# Patient Record
Sex: Female | Born: 1965
Health system: Southern US, Community
[De-identification: ages and names within clinical notes are randomized; demographics above are authoritative.]

## PROBLEM LIST (undated history)

## (undated) DIAGNOSIS — D509 Iron deficiency anemia, unspecified: Secondary | ICD-10-CM

## (undated) DIAGNOSIS — R519 Headache, unspecified: Secondary | ICD-10-CM

## (undated) DIAGNOSIS — Q8500 Neurofibromatosis, unspecified: Secondary | ICD-10-CM

## (undated) DIAGNOSIS — R51 Headache: Secondary | ICD-10-CM

## (undated) DIAGNOSIS — K219 Gastro-esophageal reflux disease without esophagitis: Secondary | ICD-10-CM

## (undated) DIAGNOSIS — M1990 Unspecified osteoarthritis, unspecified site: Secondary | ICD-10-CM

## (undated) HISTORY — PX: HERNIA REPAIR: SHX51

## (undated) HISTORY — DX: Iron deficiency anemia, unspecified: D50.9

## (undated) HISTORY — PX: OTHER SURGICAL HISTORY: SHX169

## (undated) HISTORY — PX: TUBAL LIGATION: SHX77

## (undated) HISTORY — PX: TUMOR REMOVAL: SHX12

## (undated) HISTORY — DX: Unspecified osteoarthritis, unspecified site: M19.90

## (undated) HISTORY — DX: Gastro-esophageal reflux disease without esophagitis: K21.9

---

## 2004-07-10 ENCOUNTER — Ambulatory Visit: Payer: Self-pay | Admitting: Physician Assistant

## 2004-08-14 ENCOUNTER — Ambulatory Visit: Payer: Self-pay | Admitting: Physician Assistant

## 2004-09-11 ENCOUNTER — Ambulatory Visit: Payer: Self-pay | Admitting: Physician Assistant

## 2004-10-01 ENCOUNTER — Ambulatory Visit: Payer: Self-pay | Admitting: Physician Assistant

## 2004-11-11 ENCOUNTER — Ambulatory Visit: Payer: Self-pay | Admitting: Physician Assistant

## 2004-12-05 ENCOUNTER — Ambulatory Visit: Payer: Self-pay | Admitting: Physician Assistant

## 2005-01-08 ENCOUNTER — Ambulatory Visit: Payer: Self-pay | Admitting: Physician Assistant

## 2005-02-06 ENCOUNTER — Ambulatory Visit: Payer: Self-pay | Admitting: Physician Assistant

## 2005-03-06 ENCOUNTER — Ambulatory Visit: Payer: Self-pay | Admitting: Physician Assistant

## 2005-04-09 ENCOUNTER — Ambulatory Visit: Payer: Self-pay | Admitting: Physician Assistant

## 2005-04-09 ENCOUNTER — Ambulatory Visit: Payer: Self-pay | Admitting: Internal Medicine

## 2005-05-09 ENCOUNTER — Ambulatory Visit: Payer: Self-pay | Admitting: Physician Assistant

## 2005-06-05 ENCOUNTER — Ambulatory Visit: Payer: Self-pay | Admitting: Physician Assistant

## 2005-07-02 ENCOUNTER — Ambulatory Visit: Payer: Self-pay | Admitting: Internal Medicine

## 2005-07-07 ENCOUNTER — Ambulatory Visit: Payer: Self-pay | Admitting: Physician Assistant

## 2005-08-06 ENCOUNTER — Ambulatory Visit: Payer: Self-pay | Admitting: Physician Assistant

## 2005-09-11 ENCOUNTER — Ambulatory Visit: Payer: Self-pay | Admitting: Physician Assistant

## 2005-10-09 ENCOUNTER — Ambulatory Visit: Payer: Self-pay | Admitting: Physician Assistant

## 2005-11-10 ENCOUNTER — Ambulatory Visit: Payer: Self-pay | Admitting: Physician Assistant

## 2005-12-10 ENCOUNTER — Ambulatory Visit: Payer: Self-pay | Admitting: Physician Assistant

## 2006-01-06 ENCOUNTER — Ambulatory Visit: Payer: Self-pay | Admitting: Physician Assistant

## 2006-02-05 ENCOUNTER — Ambulatory Visit: Payer: Self-pay | Admitting: Physician Assistant

## 2006-03-09 ENCOUNTER — Ambulatory Visit: Payer: Self-pay | Admitting: Physician Assistant

## 2006-04-02 ENCOUNTER — Ambulatory Visit: Payer: Self-pay | Admitting: Physician Assistant

## 2006-05-06 ENCOUNTER — Ambulatory Visit: Payer: Self-pay | Admitting: Physician Assistant

## 2006-06-18 ENCOUNTER — Ambulatory Visit: Payer: Self-pay | Admitting: Physician Assistant

## 2006-07-20 ENCOUNTER — Ambulatory Visit: Payer: Self-pay | Admitting: Physician Assistant

## 2006-08-19 ENCOUNTER — Ambulatory Visit: Payer: Self-pay | Admitting: Physician Assistant

## 2006-09-16 ENCOUNTER — Ambulatory Visit: Payer: Self-pay | Admitting: Physician Assistant

## 2006-10-20 ENCOUNTER — Ambulatory Visit: Payer: Self-pay | Admitting: Physician Assistant

## 2006-11-18 ENCOUNTER — Ambulatory Visit: Payer: Self-pay | Admitting: Physician Assistant

## 2006-12-16 ENCOUNTER — Ambulatory Visit: Payer: Self-pay | Admitting: Physician Assistant

## 2007-01-14 ENCOUNTER — Ambulatory Visit: Payer: Self-pay | Admitting: Physician Assistant

## 2007-02-15 ENCOUNTER — Ambulatory Visit: Payer: Self-pay | Admitting: Physician Assistant

## 2007-03-16 ENCOUNTER — Ambulatory Visit: Payer: Self-pay | Admitting: Internal Medicine

## 2007-03-16 ENCOUNTER — Ambulatory Visit: Payer: Self-pay | Admitting: Physician Assistant

## 2007-04-07 ENCOUNTER — Ambulatory Visit: Payer: Self-pay | Admitting: Pain Medicine

## 2007-05-17 ENCOUNTER — Ambulatory Visit: Payer: Self-pay | Admitting: Physician Assistant

## 2007-06-14 ENCOUNTER — Ambulatory Visit: Payer: Self-pay | Admitting: Physician Assistant

## 2007-07-14 ENCOUNTER — Ambulatory Visit: Payer: Self-pay | Admitting: Physician Assistant

## 2007-08-13 ENCOUNTER — Ambulatory Visit: Payer: Self-pay | Admitting: Physician Assistant

## 2007-11-03 ENCOUNTER — Ambulatory Visit: Payer: Self-pay | Admitting: Internal Medicine

## 2007-11-09 ENCOUNTER — Ambulatory Visit: Payer: Self-pay | Admitting: Physician Assistant

## 2008-02-09 ENCOUNTER — Ambulatory Visit: Payer: Self-pay | Admitting: Physician Assistant

## 2008-03-02 DIAGNOSIS — D239 Other benign neoplasm of skin, unspecified: Secondary | ICD-10-CM

## 2008-03-02 HISTORY — DX: Other benign neoplasm of skin, unspecified: D23.9

## 2008-04-11 ENCOUNTER — Ambulatory Visit: Payer: Self-pay | Admitting: Internal Medicine

## 2008-05-04 ENCOUNTER — Ambulatory Visit: Payer: Self-pay | Admitting: Physician Assistant

## 2008-08-03 ENCOUNTER — Ambulatory Visit: Payer: Self-pay | Admitting: Physician Assistant

## 2008-10-31 ENCOUNTER — Ambulatory Visit: Payer: Self-pay | Admitting: Physician Assistant

## 2009-01-31 ENCOUNTER — Ambulatory Visit: Payer: Self-pay | Admitting: Physician Assistant

## 2009-04-25 ENCOUNTER — Ambulatory Visit: Payer: Self-pay | Admitting: Physician Assistant

## 2009-07-11 ENCOUNTER — Ambulatory Visit: Payer: Self-pay | Admitting: Internal Medicine

## 2009-07-25 ENCOUNTER — Ambulatory Visit: Payer: Self-pay | Admitting: Physician Assistant

## 2009-11-07 ENCOUNTER — Ambulatory Visit: Payer: Self-pay | Admitting: Physician Assistant

## 2010-02-06 ENCOUNTER — Ambulatory Visit: Payer: Self-pay | Admitting: Pain Medicine

## 2010-09-24 ENCOUNTER — Ambulatory Visit: Payer: Self-pay | Admitting: Internal Medicine

## 2012-01-01 ENCOUNTER — Ambulatory Visit: Payer: Self-pay | Admitting: Internal Medicine

## 2012-01-28 ENCOUNTER — Ambulatory Visit: Payer: Self-pay

## 2012-04-02 ENCOUNTER — Ambulatory Visit: Payer: Self-pay | Admitting: Gastroenterology

## 2012-04-05 LAB — PATHOLOGY REPORT

## 2013-06-24 ENCOUNTER — Ambulatory Visit: Payer: Self-pay | Admitting: Internal Medicine

## 2014-10-11 ENCOUNTER — Ambulatory Visit: Payer: Self-pay | Admitting: Internal Medicine

## 2015-07-13 DIAGNOSIS — C7230 Malignant neoplasm of unspecified optic nerve: Secondary | ICD-10-CM | POA: Insufficient documentation

## 2015-07-13 DIAGNOSIS — M79609 Pain in unspecified limb: Secondary | ICD-10-CM | POA: Insufficient documentation

## 2015-07-13 DIAGNOSIS — D3610 Benign neoplasm of peripheral nerves and autonomic nervous system, unspecified: Secondary | ICD-10-CM | POA: Insufficient documentation

## 2015-07-17 DIAGNOSIS — Q8501 Neurofibromatosis, type 1: Secondary | ICD-10-CM | POA: Insufficient documentation

## 2016-01-05 DIAGNOSIS — R600 Localized edema: Secondary | ICD-10-CM | POA: Diagnosis not present

## 2016-01-05 DIAGNOSIS — L03113 Cellulitis of right upper limb: Secondary | ICD-10-CM | POA: Diagnosis not present

## 2016-01-22 DIAGNOSIS — M545 Low back pain: Secondary | ICD-10-CM | POA: Diagnosis not present

## 2016-01-22 DIAGNOSIS — M25551 Pain in right hip: Secondary | ICD-10-CM | POA: Diagnosis not present

## 2016-01-22 DIAGNOSIS — Z79891 Long term (current) use of opiate analgesic: Secondary | ICD-10-CM | POA: Diagnosis not present

## 2016-01-22 DIAGNOSIS — G894 Chronic pain syndrome: Secondary | ICD-10-CM | POA: Diagnosis not present

## 2016-02-11 ENCOUNTER — Emergency Department: Payer: BLUE CROSS/BLUE SHIELD

## 2016-02-11 ENCOUNTER — Encounter: Payer: Self-pay | Admitting: *Deleted

## 2016-02-11 ENCOUNTER — Emergency Department
Admission: EM | Admit: 2016-02-11 | Discharge: 2016-02-11 | Disposition: A | Discharge status: Home / Self Care | Payer: BLUE CROSS/BLUE SHIELD | Attending: Emergency Medicine | Admitting: Emergency Medicine

## 2016-02-11 DIAGNOSIS — R0600 Dyspnea, unspecified: Secondary | ICD-10-CM | POA: Diagnosis not present

## 2016-02-11 DIAGNOSIS — K2100 Gastro-esophageal reflux disease with esophagitis, without bleeding: Secondary | ICD-10-CM

## 2016-02-11 DIAGNOSIS — R0602 Shortness of breath: Secondary | ICD-10-CM | POA: Diagnosis not present

## 2016-02-11 HISTORY — DX: Neurofibromatosis, unspecified: Q85.00

## 2016-02-11 LAB — COMPREHENSIVE METABOLIC PANEL
ALBUMIN: 4 g/dL (ref 3.5–5.0)
ALT: 17 U/L (ref 14–54)
ANION GAP: 7 (ref 5–15)
AST: 28 U/L (ref 15–41)
Alkaline Phosphatase: 46 U/L (ref 38–126)
BUN: 14 mg/dL (ref 6–20)
CHLORIDE: 104 mmol/L (ref 101–111)
CO2: 28 mmol/L (ref 22–32)
Calcium: 9.5 mg/dL (ref 8.9–10.3)
Creatinine, Ser: 0.78 mg/dL (ref 0.44–1.00)
GFR calc Af Amer: >60 mL/min (ref >60)
GFR calc non Af Amer: >60 mL/min (ref >60)
GLUCOSE: 101 mg/dL — AB (ref 65–99)
Potassium: 4 mmol/L (ref 3.5–5.1)
SODIUM: 139 mmol/L (ref 135–145)
Total Bilirubin: 0.6 mg/dL (ref 0.3–1.2)
Total Protein: 6.8 g/dL (ref 6.5–8.1)

## 2016-02-11 LAB — CBC WITH DIFFERENTIAL/PLATELET
BASOS PCT: 1 %
Basophils Absolute: 0.1 10*3/uL (ref 0–0.1)
EOS ABS: 0.5 10*3/uL (ref 0–0.7)
EOS PCT: 7 %
HCT: 30.2 % — ABNORMAL LOW (ref 35.0–47.0)
Hemoglobin: 9.7 g/dL — ABNORMAL LOW (ref 12.0–16.0)
LYMPHS ABS: 1.5 10*3/uL (ref 1.0–3.6)
Lymphocytes Relative: 21 %
MCH: 22.9 pg — AB (ref 26.0–34.0)
MCHC: 32.1 g/dL (ref 32.0–36.0)
MCV: 71.3 fL — ABNORMAL LOW (ref 80.0–100.0)
Monocytes Absolute: 0.9 10*3/uL (ref 0.2–0.9)
Monocytes Relative: 13 %
NEUTROS PCT: 58 %
Neutro Abs: 4.2 10*3/uL (ref 1.4–6.5)
PLATELETS: 189 10*3/uL (ref 150–440)
RBC: 4.23 MIL/uL (ref 3.80–5.20)
RDW: 18 % — ABNORMAL HIGH (ref 11.5–14.5)
WBC: 7.3 10*3/uL (ref 3.6–11.0)

## 2016-02-11 LAB — LIPASE, BLOOD: LIPASE: 22 U/L (ref 11–51)

## 2016-02-11 LAB — TROPONIN I: Troponin I: <0.03 ng/mL (ref <0.031)

## 2016-02-11 MED ORDER — ONDANSETRON HCL 4 MG/2ML IJ SOLN
4.0000 mg | Freq: Once | INTRAMUSCULAR | Status: AC
  Administered 2016-02-11: 4 mg via INTRAVENOUS
  Filled 2016-02-11: qty 2

## 2016-02-11 MED ORDER — GI COCKTAIL ~~LOC~~
30.0000 mL | Freq: Once | ORAL | Status: AC
  Administered 2016-02-11: 30 mL via ORAL

## 2016-02-11 MED ORDER — PANTOPRAZOLE SODIUM 40 MG IV SOLR
40.0000 mg | Freq: Once | INTRAVENOUS | Status: AC
  Administered 2016-02-11: 40 mg via INTRAVENOUS
  Filled 2016-02-11: qty 40

## 2016-02-11 MED ORDER — SODIUM CHLORIDE 0.9 % IV BOLUS (SEPSIS)
500.0000 mL | Freq: Once | INTRAVENOUS | Status: AC
Start: 2016-02-11 — End: 2016-02-11
  Administered 2016-02-11: 500 mL via INTRAVENOUS

## 2016-02-11 MED ORDER — HYDROMORPHONE HCL 1 MG/ML IJ SOLN
0.5000 mg | Freq: Once | INTRAMUSCULAR | Status: AC
  Administered 2016-02-11: 0.5 mg via INTRAVENOUS
  Filled 2016-02-11: qty 1

## 2016-02-11 MED ORDER — GI COCKTAIL ~~LOC~~
ORAL | Status: AC
  Administered 2016-02-11: 30 mL via ORAL
  Filled 2016-02-11: qty 30

## 2016-02-11 MED ORDER — PROCHLORPERAZINE MALEATE 10 MG PO TABS: 10.0000 mg | ORAL_TABLET | Freq: Four times a day (QID) | ORAL | Status: DC | PRN

## 2016-02-11 MED ORDER — FAMOTIDINE 20 MG PO TABS: 20.0000 mg | ORAL_TABLET | Freq: Two times a day (BID) | ORAL | Status: DC

## 2016-02-11 NOTE — ED Provider Notes (Signed)
[facility] Emergency Department Provider Note   ____________________________________________  Time seen: Approximately 3:22 AM  I have reviewed the triage vital signs and the nursing notes.   HISTORY  Chief Complaint Shortness of Breath    HPI Andrea Bernard is a 50 y.o. female who presents to the ED from home with a chief complaint of burning and shortness of breath. Patient has a history of neurofibromatosis and states she "swells when the weather changes". As a result of the "swelling", she had a migraine headache 2 days ago. She has adverse GI reactions to aspirin and NSAIDs, but states her head hurts so bad that she took Excedrin 3 times to resolve the headache. Headache resolved, but patient then began to experience a burning sensation in her throat. Tonight she awoke with the burning sensation in her throat which made her gasp and difficult to breathe. Denies fever, chills, chest pain, abdominal pain, nausea, vomiting, diarrhea. Nothing makes her symptoms better or worse.   Past Medical History  Diagnosis Date  . Neurofibromatosis ([place])     There are no active problems to display for this patient.   Past Surgical History  Procedure Laterality Date  . Cesarean section      x 4    Current Outpatient Rx  Name  Route  Sig  Dispense  Refill  . cyclobenzaprine (FLEXERIL) 10 MG tablet   Oral   Take 10 mg by mouth at bedtime as needed for muscle spasms.       0   . gabapentin (NEURONTIN) 300 MG capsule   Oral   Take 300 mg by mouth 3 (three) times daily.      0   . OXYCONTIN 80 MG 12 hr tablet   Oral   Take 80 mg by mouth every 12 (twelve) hours.      0     Dispense as written.   . pantoprazole (PROTONIX) 40 MG tablet   Oral   Take 40 mg by mouth daily.           Allergies Review of patient's allergies indicates no known allergies.  History reviewed. No pertinent family history.  Social History Social History    Substance Use Topics  . Smoking status: Never Smoker   . Smokeless tobacco: Never Used  . Alcohol Use: No    Review of Systems  Constitutional: No fever/chills. Eyes: No visual changes. ENT: No sore throat. Cardiovascular: Positive for burning in chest and throat. Denies chest pain. Respiratory: Positive for shortness of breath. Gastrointestinal: No abdominal pain.  No nausea, no vomiting.  No diarrhea.  No constipation. Genitourinary: Negative for dysuria. Musculoskeletal: Negative for back pain. Skin: Negative for rash. Neurological: Negative for headaches, focal weakness or numbness.  10-point ROS otherwise negative.  ____________________________________________   PHYSICAL EXAM:  VITAL SIGNS: ED Triage Vitals  Enc Vitals Group     BP 02/11/16 0130 141/74 mmHg     Pulse Rate 02/11/16 0130 81     Resp 02/11/16 0130 20     Temp 02/11/16 0130 98.4 F (36.9 C)     Temp Source 02/11/16 0130 Oral     SpO2 02/11/16 0130 100 %     Weight 02/11/16 0130 166 lb (75.297 kg)     Height 02/11/16 0130 5\' 4"  (1.626 m)     Head Cir --      Peak Flow --      Pain Score 02/11/16 0131 8  Pain Loc --      Pain Edu? --      Excl. in [place]? --     Constitutional: Alert and oriented. Well appearing and in no acute distress. Eyes: Conjunctivae are normal. PERRL. EOMI. Head: Atraumatic. Nose: No congestion/rhinnorhea. Mouth/Throat: Mucous membranes are moist.  Oropharynx non-erythematous.  Posterior oropharynx slightly inflamed.  Mildly hoarse voice. There is no muffled voice or drooling. Frequently clears throat secondary to burning sensation. Neck: No stridor.  No carotid bruits. Supple neck without meningismus. No brawny edema. Cardiovascular: Normal rate, regular rhythm. Grossly normal heart sounds.  Good peripheral circulation. Respiratory: Normal respiratory effort.  No retractions. Lungs CTAB. Gastrointestinal: Soft and nontender. No distention. No abdominal bruits. No CVA  tenderness. Musculoskeletal: No lower extremity tenderness nor edema.  No joint effusions. Neurologic:  Normal speech and language. No gross focal neurologic deficits are appreciated. No gait instability. Skin:  Skin is warm, dry and intact. No rash noted. Psychiatric: Mood and affect are normal. Speech and behavior are normal.  ____________________________________________   LABS (all labs ordered are listed, but only abnormal results are displayed)  Labs Reviewed  CBC WITH DIFFERENTIAL/PLATELET - Abnormal; Notable for the following:    Hemoglobin 9.7 (*)    HCT 30.2 (*)    MCV 71.3 (*)    MCH 22.9 (*)    RDW 18.0 (*)    All other components within normal limits  COMPREHENSIVE METABOLIC PANEL - Abnormal; Notable for the following:    Glucose, Bld 101 (*)    All other components within normal limits  LIPASE, BLOOD  TROPONIN I   ____________________________________________  EKG  ED ECG REPORT I, Alejandro Gamel J, the attending physician, personally viewed and interpreted this ECG.   Date: 02/11/2016  EKG Time: 0131  Rate: 78  Rhythm: normal EKG, normal sinus rhythm  Axis: Normal  Intervals:none  ST&T Change: Nonspecific  ____________________________________________  RADIOLOGY  Chest 2 view (viewed by me, interpreted per Dr. Alroy Dust): No active cardiopulmonary disease. ____________________________________________   PROCEDURES  Procedure(s) performed: None  Critical Care performed: No  ____________________________________________   INITIAL IMPRESSION / ASSESSMENT AND PLAN / ED COURSE  Pertinent labs & imaging results that were available during my care of the patient were reviewed by me and considered in my medical decision making (see chart for details).  50 year old female who presents with burning sensation in chest and throat after taking aspirin product. History of same. Partial relief after GI cocktail. Will check screening lab work including troponin,  administer IV Protonix, analgesia and reassess.  ----------------------------------------- 5:30 AM on 02/11/2016 -----------------------------------------  Patient is feeling much better. Resting comfortably, smiling. Already takes Protonix. Will prescribe Pepcid to take in conjunction. Also will provide prescription for Compazine to use as needed for her headaches. Strict return precautions given. Patient verbalizes understanding and agrees with plan of care. ____________________________________________   FINAL CLINICAL IMPRESSION(S) / ED DIAGNOSES  Final diagnoses:  Gastroesophageal reflux disease with esophagitis      NEW MEDICATIONS STARTED DURING THIS VISIT:  New Prescriptions   No medications on file     Note:  This document was prepared using Dragon voice recognition software and may include unintentional dictation errors.    Paulette Blanch, MD 02/11/16 [phone]

## 2016-02-11 NOTE — Discharge Instructions (Signed)
1. Start Protonix 20 mg twice daily (#60). 2. You may take Compazine (#20) as needed for headaches. 3. Bland diet 5 days, then slowly advance diet as tolerated. 4. Return to the ER for worsening symptoms, persistent vomiting, difficulty breathing or other concerns.  Gastroesophageal Reflux Disease, Adult Normally, food travels down the esophagus and stays in the stomach to be digested. However, when a person has gastroesophageal reflux disease (GERD), food and stomach acid move back up into the esophagus. When this happens, the esophagus becomes sore and inflamed. Over time, GERD can create small holes (ulcers) in the lining of the esophagus.  CAUSES This condition is caused by a problem with the muscle between the esophagus and the stomach (lower esophageal sphincter, or LES). Normally, the LES muscle closes after food passes through the esophagus to the stomach. When the LES is weakened or abnormal, it does not close properly, and that allows food and stomach acid to go back up into the esophagus. The LES can be weakened by certain dietary substances, medicines, and medical conditions, including:  Tobacco use.  Pregnancy.  Having a hiatal hernia.  Heavy alcohol use.  Certain foods and beverages, such as coffee, chocolate, onions, and peppermint. RISK FACTORS This condition is more likely to develop in:  People who have an increased body weight.  People who have connective tissue disorders.  People who use NSAID medicines. SYMPTOMS Symptoms of this condition include:  Heartburn.  Difficult or painful swallowing.  The feeling of having a lump in the throat.  Abitter taste in the mouth.  Bad breath.  Having a large amount of saliva.  Having an upset or bloated stomach.  Belching.  Chest pain.  Shortness of breath or wheezing.  Ongoing (chronic) cough or a night-time cough.  Wearing away of tooth enamel.  Weight loss. Different conditions can cause chest pain.  Make sure to see your health care provider if you experience chest pain. DIAGNOSIS Your health care provider will take a medical history and perform a physical exam. To determine if you have mild or severe GERD, your health care provider may also monitor how you respond to treatment. You may also have other tests, including:  An endoscopy toexamine your stomach and esophagus with a small camera.  A test thatmeasures the acidity level in your esophagus.  A test thatmeasures how much pressure is on your esophagus.  A barium swallow or modified barium swallow to show the shape, size, and functioning of your esophagus. TREATMENT The goal of treatment is to help relieve your symptoms and to prevent complications. Treatment for this condition may vary depending on how severe your symptoms are. Your health care provider may recommend:  Changes to your diet.  Medicine.  Surgery. HOME CARE INSTRUCTIONS Diet  Follow a diet as recommended by your health care provider. This may involve avoiding foods and drinks such as:  Coffee and tea (with or without caffeine).  Drinks that containalcohol.  Energy drinks and sports drinks.  Carbonated drinks or sodas.  Chocolate and cocoa.  Peppermint and mint flavorings.  Garlic and onions.  Horseradish.  Spicy and acidic foods, including peppers, chili powder, curry powder, vinegar, hot sauces, and barbecue sauce.  Citrus fruit juices and citrus fruits, such as oranges, lemons, and limes.  Tomato-based foods, such as red sauce, chili, salsa, and pizza with red sauce.  Fried and fatty foods, such as donuts, french fries, potato chips, and high-fat dressings.  High-fat meats, such as hot dogs and fatty  cuts of red and white meats, such as rib eye steak, sausage, ham, and bacon.  High-fat dairy items, such as whole milk, butter, and cream cheese.  Eat small, frequent meals instead of large meals.  Avoid drinking large amounts of liquid  with your meals.  Avoid eating meals during the 2-3 hours before bedtime.  Avoid lying down right after you eat.  Do not exercise right after you eat. General Instructions  Pay attention to any changes in your symptoms.  Take over-the-counter and prescription medicines only as told by your health care provider. Do not take aspirin, ibuprofen, or other NSAIDs unless your health care provider told you to do so.  Do not use any tobacco products, including cigarettes, chewing tobacco, and e-cigarettes. If you need help quitting, ask your health care provider.  Wear loose-fitting clothing. Do not wear anything tight around your waist that causes pressure on your abdomen.  Raise (elevate) the head of your bed 6 inches (15cm).  Try to reduce your stress, such as with yoga or meditation. If you need help reducing stress, ask your health care provider.  If you are overweight, reduce your weight to an amount that is healthy for you. Ask your health care provider for guidance about a safe weight loss goal.  Keep all follow-up visits as told by your health care provider. This is important. SEEK MEDICAL CARE IF:  You have new symptoms.  You have unexplained weight loss.  You have difficulty swallowing, or it hurts to swallow.  You have wheezing or a persistent cough.  Your symptoms do not improve with treatment.  You have a hoarse voice. SEEK IMMEDIATE MEDICAL CARE IF:  You have pain in your arms, neck, jaw, teeth, or back.  You feel sweaty, dizzy, or light-headed.  You have chest pain or shortness of breath.  You vomit and your vomit looks like blood or coffee grounds.  You faint.  Your stool is bloody or black.  You cannot swallow, drink, or eat.   This information is not intended to replace advice given to you by your health care provider. Make sure you discuss any questions you have with your health care provider.   Document Released: 07/02/2005 Document Revised:  06/13/2015 Document Reviewed: 01/17/2015 Elsevier Interactive Patient Education 2016 Elsevier Inc[employer]  Esophagitis Esophagitis is inflammation of the esophagus. The esophagus is the tube that carries food and liquids from your mouth to your stomach. Esophagitis can cause soreness or pain in the esophagus. This condition can make it difficult and painful to swallow.  CAUSES Most causes of esophagitis are not serious. Common causes of this condition include:  Gastroesophageal reflux disease (GERD). This is when stomach contents move back up into the esophagus (reflux).  Repeated vomiting.  An allergic-type reaction, especially caused by food allergies (eosinophilic esophagitis).  Injury to the esophagus by swallowing large pills with or without water, or swallowing certain types of medicines.  Swallowing (ingesting) harmful chemicals, such as household cleaning products.  Heavy alcohol use.  An infection of the esophagus.This most often occurs in people who have a weakened immune system.  Radiation or chemotherapy treatment for cancer.  Certain diseases such as sarcoidosis, Crohn disease, and scleroderma. SYMPTOMS Symptoms of this condition include:  Difficult or painful swallowing.  Pain with swallowing acidic liquids, such as citrus juices.  Pain with burping.  Chest pain.  Difficulty breathing.  Nausea.  Vomiting.  Pain in the abdomen.  Weight loss.  Ulcers in the mouth.  Patches  of white material in the mouth (candidiasis).  Fever.  Coughing up blood or vomiting blood.  Stool that is black, tarry, or bright red. DIAGNOSIS Your health care provider will take a medical history and perform a physical exam. You may also have other tests, including:  An endoscopy to examine your stomach and esophagus with a small camera.  A test that measures the acidity level in your esophagus.  A test that measures how much pressure is on your esophagus.  A barium swallow  or modified barium swallow to show the shape, size, and functioning of your esophagus.  Allergy tests. TREATMENT Treatment for this condition depends on the cause of your esophagitis. In some cases, steroids or other medicines may be given to help relieve your symptoms or to treat the underlying cause of your condition. You may have to make some lifestyle changes, such as:  Avoiding alcohol.  Quitting smoking.  Changing your diet.  Exercising.  Changing your sleep habits and your sleep environment. HOME CARE INSTRUCTIONS Take these actions to decrease your discomfort and to help avoid complications. Diet  Follow a diet as recommended by your health care provider. This may involve avoiding foods and drinks such as:  Coffee and tea (with or without caffeine).  Drinks that contain alcohol.  Energy drinks and sports drinks.  Carbonated drinks or sodas.  Chocolate and cocoa.  Peppermint and mint flavorings.  Garlic and onions.  Horseradish.  Spicy and acidic foods, including peppers, chili powder, curry powder, vinegar, hot sauces, and barbecue sauce.  Citrus fruit juices and citrus fruits, such as oranges, lemons, and limes.  Tomato-based foods, such as red sauce, chili, salsa, and pizza with red sauce.  Fried and fatty foods, such as donuts, french fries, potato chips, and high-fat dressings.  High-fat meats, such as hot dogs and fatty cuts of red and white meats, such as rib eye steak, sausage, ham, and bacon.  High-fat dairy items, such as whole milk, butter, and cream cheese.  Eat small, frequent meals instead of large meals.  Avoid drinking large amounts of liquid with your meals.  Avoid eating meals during the 2-3 hours before bedtime.  Avoid lying down right after you eat.  Do not exercise right after you eat.  Avoid foods and drinks that seem to make your symptoms worse. General Instructions  Pay attention to any changes in your symptoms.  Take  over-the-counter and prescription medicines only as told by your health care provider. Do not take aspirin, ibuprofen, or other NSAIDs unless your health care provider told you to do so.  If you have trouble taking pills, use a pill splitter to decrease the size of the pill. This will decrease the chance of the pill getting stuck or injuring your esophagus on the way down. Also, drink water after you take a pill.  Do not use any tobacco products, including cigarettes, chewing tobacco, and e-cigarettes. If you need help quitting, ask your health care provider.  Wear loose-fitting clothing. Do not wear anything tight around your waist that causes pressure on your abdomen.  Raise (elevate) the head of your bed about 6 inches (15 cm).  Try to reduce your stress, such as with yoga or meditation. If you need help reducing stress, ask your health care provider.  If you are overweight, reduce your weight to an amount that is healthy for you. Ask your health care provider for guidance about a safe weight loss goal.  Keep all follow-up visits as told  by your health care provider. This is important. SEEK MEDICAL CARE IF:  You have new symptoms.  You have unexplained weight loss.  You have difficulty swallowing, or it hurts to swallow.  You have wheezing or a persistent cough.  Your symptoms do not improve with treatment.  You have frequent heartburn for more than two weeks. SEEK IMMEDIATE MEDICAL CARE IF:  You have severe pain in your arms, neck, jaw, teeth, or back.  You feel sweaty, dizzy, or light-headed.  You have chest pain or shortness of breath.  You vomit and your vomit looks like blood or coffee grounds.  Your stool is bloody or black.  You have a fever.  You cannot swallow, drink, or eat.   This information is not intended to replace advice given to you by your health care provider. Make sure you discuss any questions you have with your health care provider.   Document  Released: 10/30/2004 Document Revised: 06/13/2015 Document Reviewed: 01/17/2015 Elsevier Interactive Patient Education 2016 [place] for Gastroesophageal Reflux Disease, Adult When you have gastroesophageal reflux disease (GERD), the foods you eat and your eating habits are very important. Choosing the right foods can help ease your discomfort.  WHAT GUIDELINES DO I NEED TO FOLLOW?   Choose fruits, vegetables, whole grains, and low-fat dairy products.   Choose low-fat meat, fish, and poultry.  Limit fats such as oils, salad dressings, butter, nuts, and avocado.   Keep a food diary. This helps you identify foods that cause symptoms.   Avoid foods that cause symptoms. These may be different for everyone.   Eat small meals often instead of 3 large meals a day.   Eat your meals slowly, in a place where you are relaxed.   Limit fried foods.   Cook foods using methods other than frying.   Avoid drinking alcohol.   Avoid drinking large amounts of liquids with your meals.   Avoid bending over or lying down until 2-3 hours after eating.  WHAT FOODS ARE NOT RECOMMENDED?  These are some foods and drinks that may make your symptoms worse: Vegetables Tomatoes. Tomato juice. Tomato and spaghetti sauce. Chili peppers. Onion and garlic. Horseradish. Fruits Oranges, grapefruit, and lemon (fruit and juice). Meats High-fat meats, fish, and poultry. This includes hot dogs, ribs, ham, sausage, salami, and bacon. Dairy Whole milk and chocolate milk. Sour cream. Cream. Butter. Ice cream. Cream cheese.  Drinks Coffee and tea. Bubbly (carbonated) drinks or energy drinks. Condiments Hot sauce. Barbecue sauce.  Sweets/Desserts Chocolate and cocoa. Donuts. Peppermint and spearmint. Fats and Oils High-fat foods. This includes [place] fries and potato chips. Other Vinegar. Strong spices. This includes black pepper, white pepper, red pepper, cayenne, curry powder,  cloves, ginger, and chili powder. The items listed above may not be a complete list of foods and drinks to avoid. Contact your dietitian for more information.   This information is not intended to replace advice given to you by your health care provider. Make sure you discuss any questions you have with your health care provider.   Document Released: 03/23/2012 Document Revised: 10/13/2014 Document Reviewed: 07/27/2013 Elsevier Interactive Patient Education [employer].

## 2016-02-11 NOTE — ED Notes (Signed)
Pt. Going home with family.  Pt. Given work excuse.

## 2016-02-11 NOTE — ED Notes (Signed)
Pt presents w/ c/o shortness of breath associated w/ a burning sensation mid-sternally. Pt is anxious, clears throat frequently, states she feels as if she has vomited, but has not. Pt pale, not diaphoretic. Pt denies abdominal pain. Pt states she took excedrin for migraine. Pt states she has chronic illness that causes her to have migraines w/ weather changes.

## 2016-02-11 NOTE — ED Notes (Signed)
Pt. States long hx of pain medication (nsaids + opiods) 20+ years.  Pt. States hx of migraines.  Pt states  Hx of type 3 neurofibramatosis.  Pt. States she discontinued use of motrin and excedrin due to gastric issues.  Pt. States migraine was extremely bad Saturday, so she took excedrin 3 times to stop HA.  Pt. States HA resolved, but burning sensation in throat has made it difficult to breath tonight.

## 2016-03-20 DIAGNOSIS — M542 Cervicalgia: Secondary | ICD-10-CM | POA: Diagnosis not present

## 2016-03-20 DIAGNOSIS — G894 Chronic pain syndrome: Secondary | ICD-10-CM | POA: Diagnosis not present

## 2016-03-20 DIAGNOSIS — Z79891 Long term (current) use of opiate analgesic: Secondary | ICD-10-CM | POA: Diagnosis not present

## 2016-03-20 DIAGNOSIS — M25551 Pain in right hip: Secondary | ICD-10-CM | POA: Diagnosis not present

## 2016-05-20 DIAGNOSIS — G894 Chronic pain syndrome: Secondary | ICD-10-CM | POA: Diagnosis not present

## 2016-05-20 DIAGNOSIS — M542 Cervicalgia: Secondary | ICD-10-CM | POA: Diagnosis not present

## 2016-05-20 DIAGNOSIS — M25551 Pain in right hip: Secondary | ICD-10-CM | POA: Diagnosis not present

## 2016-05-20 DIAGNOSIS — M545 Low back pain: Secondary | ICD-10-CM | POA: Diagnosis not present

## 2016-05-20 DIAGNOSIS — Z79891 Long term (current) use of opiate analgesic: Secondary | ICD-10-CM | POA: Diagnosis not present

## 2016-06-16 ENCOUNTER — Other Ambulatory Visit: Payer: Self-pay | Admitting: Nurse Practitioner

## 2016-06-16 DIAGNOSIS — Z124 Encounter for screening for malignant neoplasm of cervix: Secondary | ICD-10-CM | POA: Diagnosis not present

## 2016-06-16 DIAGNOSIS — Q8500 Neurofibromatosis, unspecified: Secondary | ICD-10-CM | POA: Diagnosis not present

## 2016-06-16 DIAGNOSIS — Z1231 Encounter for screening mammogram for malignant neoplasm of breast: Secondary | ICD-10-CM

## 2016-06-16 DIAGNOSIS — Z0001 Encounter for general adult medical examination with abnormal findings: Secondary | ICD-10-CM | POA: Diagnosis not present

## 2016-06-16 DIAGNOSIS — R110 Nausea: Secondary | ICD-10-CM | POA: Diagnosis not present

## 2016-06-16 DIAGNOSIS — E663 Overweight: Secondary | ICD-10-CM | POA: Diagnosis not present

## 2016-06-25 DIAGNOSIS — Q8501 Neurofibromatosis, type 1: Secondary | ICD-10-CM | POA: Diagnosis not present

## 2016-06-25 DIAGNOSIS — D3610 Benign neoplasm of peripheral nerves and autonomic nervous system, unspecified: Secondary | ICD-10-CM | POA: Diagnosis not present

## 2016-06-25 DIAGNOSIS — D239 Other benign neoplasm of skin, unspecified: Secondary | ICD-10-CM | POA: Diagnosis not present

## 2016-07-03 ENCOUNTER — Ambulatory Visit
Admission: RE | Admit: 2016-07-03 | Discharge: 2016-07-03 | Disposition: A | Discharge status: Home / Self Care | Payer: BLUE CROSS/BLUE SHIELD | Source: Ambulatory Visit | Attending: Nurse Practitioner | Admitting: Nurse Practitioner

## 2016-07-03 DIAGNOSIS — Z1231 Encounter for screening mammogram for malignant neoplasm of breast: Secondary | ICD-10-CM | POA: Insufficient documentation

## 2016-07-09 DIAGNOSIS — Q8501 Neurofibromatosis, type 1: Secondary | ICD-10-CM | POA: Diagnosis not present

## 2016-07-09 DIAGNOSIS — Z6828 Body mass index (BMI) 28.0-28.9, adult: Secondary | ICD-10-CM | POA: Diagnosis not present

## 2016-07-09 DIAGNOSIS — D3610 Benign neoplasm of peripheral nerves and autonomic nervous system, unspecified: Secondary | ICD-10-CM | POA: Diagnosis not present

## 2016-07-15 DIAGNOSIS — Z79891 Long term (current) use of opiate analgesic: Secondary | ICD-10-CM | POA: Diagnosis not present

## 2016-07-15 DIAGNOSIS — M25551 Pain in right hip: Secondary | ICD-10-CM | POA: Diagnosis not present

## 2016-07-15 DIAGNOSIS — G894 Chronic pain syndrome: Secondary | ICD-10-CM | POA: Diagnosis not present

## 2016-07-15 DIAGNOSIS — M545 Low back pain: Secondary | ICD-10-CM | POA: Diagnosis not present

## 2016-07-15 DIAGNOSIS — M542 Cervicalgia: Secondary | ICD-10-CM | POA: Diagnosis not present

## 2016-07-23 DIAGNOSIS — J069 Acute upper respiratory infection, unspecified: Secondary | ICD-10-CM | POA: Diagnosis not present

## 2016-07-23 DIAGNOSIS — J392 Other diseases of pharynx: Secondary | ICD-10-CM | POA: Diagnosis not present

## 2016-07-23 DIAGNOSIS — R635 Abnormal weight gain: Secondary | ICD-10-CM | POA: Diagnosis not present

## 2016-08-19 DIAGNOSIS — Q8501 Neurofibromatosis, type 1: Secondary | ICD-10-CM | POA: Diagnosis not present

## 2016-08-19 DIAGNOSIS — D179 Benign lipomatous neoplasm, unspecified: Secondary | ICD-10-CM | POA: Diagnosis not present

## 2016-08-19 DIAGNOSIS — D3612 Benign neoplasm of peripheral nerves and autonomic nervous system, upper limb, including shoulder: Secondary | ICD-10-CM | POA: Diagnosis not present

## 2016-08-19 DIAGNOSIS — D1721 Benign lipomatous neoplasm of skin and subcutaneous tissue of right arm: Secondary | ICD-10-CM | POA: Diagnosis not present

## 2016-08-22 DIAGNOSIS — E663 Overweight: Secondary | ICD-10-CM | POA: Diagnosis not present

## 2016-08-22 DIAGNOSIS — J300 Vasomotor rhinitis: Secondary | ICD-10-CM | POA: Diagnosis not present

## 2016-08-22 DIAGNOSIS — J069 Acute upper respiratory infection, unspecified: Secondary | ICD-10-CM | POA: Diagnosis not present

## 2016-09-10 DIAGNOSIS — G894 Chronic pain syndrome: Secondary | ICD-10-CM | POA: Diagnosis not present

## 2016-09-10 DIAGNOSIS — M545 Low back pain: Secondary | ICD-10-CM | POA: Diagnosis not present

## 2016-09-10 DIAGNOSIS — M25551 Pain in right hip: Secondary | ICD-10-CM | POA: Diagnosis not present

## 2016-09-10 DIAGNOSIS — M542 Cervicalgia: Secondary | ICD-10-CM | POA: Diagnosis not present

## 2017-02-25 DIAGNOSIS — D179 Benign lipomatous neoplasm, unspecified: Secondary | ICD-10-CM | POA: Insufficient documentation

## 2017-02-25 DIAGNOSIS — D1722 Benign lipomatous neoplasm of skin and subcutaneous tissue of left arm: Secondary | ICD-10-CM | POA: Insufficient documentation

## 2017-02-26 DIAGNOSIS — D179 Benign lipomatous neoplasm, unspecified: Secondary | ICD-10-CM | POA: Insufficient documentation

## 2017-05-27 ENCOUNTER — Telehealth: Payer: Self-pay | Admitting: Gastroenterology

## 2017-05-27 NOTE — Telephone Encounter (Signed)
Patient is returning your call. Please call her after 1:00 today. [phone]

## 2017-05-29 ENCOUNTER — Ambulatory Visit: Payer: BLUE CROSS/BLUE SHIELD | Admitting: Gastroenterology

## 2017-06-03 ENCOUNTER — Inpatient Hospital Stay: Payer: BLUE CROSS/BLUE SHIELD | Admitting: Oncology

## 2017-06-15 ENCOUNTER — Encounter: Payer: Self-pay | Admitting: *Deleted

## 2017-06-15 ENCOUNTER — Inpatient Hospital Stay: Payer: BLUE CROSS/BLUE SHIELD

## 2017-06-15 ENCOUNTER — Inpatient Hospital Stay: Payer: BLUE CROSS/BLUE SHIELD | Attending: Oncology | Admitting: Oncology

## 2017-06-15 ENCOUNTER — Encounter: Payer: Self-pay | Admitting: Oncology

## 2017-06-15 VITALS — BP 105/65 | HR 67 | Temp 97.6°F | Resp 16 | Ht 64.5 in | Wt 166.4 lb

## 2017-06-15 DIAGNOSIS — Z85858 Personal history of malignant neoplasm of other endocrine glands: Secondary | ICD-10-CM | POA: Diagnosis not present

## 2017-06-15 DIAGNOSIS — Z79899 Other long term (current) drug therapy: Secondary | ICD-10-CM | POA: Diagnosis not present

## 2017-06-15 DIAGNOSIS — M1990 Unspecified osteoarthritis, unspecified site: Secondary | ICD-10-CM | POA: Diagnosis not present

## 2017-06-15 DIAGNOSIS — D509 Iron deficiency anemia, unspecified: Secondary | ICD-10-CM | POA: Diagnosis not present

## 2017-06-15 DIAGNOSIS — K5903 Drug induced constipation: Secondary | ICD-10-CM | POA: Diagnosis not present

## 2017-06-15 DIAGNOSIS — R5383 Other fatigue: Secondary | ICD-10-CM | POA: Diagnosis not present

## 2017-06-15 DIAGNOSIS — Q8500 Neurofibromatosis, unspecified: Secondary | ICD-10-CM | POA: Insufficient documentation

## 2017-06-15 DIAGNOSIS — T402X5S Adverse effect of other opioids, sequela: Secondary | ICD-10-CM | POA: Diagnosis not present

## 2017-06-15 DIAGNOSIS — Z1509 Genetic susceptibility to other malignant neoplasm: Secondary | ICD-10-CM | POA: Diagnosis not present

## 2017-06-15 DIAGNOSIS — K219 Gastro-esophageal reflux disease without esophagitis: Secondary | ICD-10-CM | POA: Insufficient documentation

## 2017-06-15 DIAGNOSIS — G8929 Other chronic pain: Secondary | ICD-10-CM | POA: Insufficient documentation

## 2017-06-15 DIAGNOSIS — G894 Chronic pain syndrome: Secondary | ICD-10-CM | POA: Insufficient documentation

## 2017-06-15 DIAGNOSIS — K5900 Constipation, unspecified: Secondary | ICD-10-CM | POA: Diagnosis not present

## 2017-06-15 DIAGNOSIS — D508 Other iron deficiency anemias: Secondary | ICD-10-CM

## 2017-06-15 HISTORY — DX: Iron deficiency anemia, unspecified: D50.9

## 2017-06-15 LAB — CBC WITH DIFFERENTIAL/PLATELET
BASOS ABS: 0.1 10*3/uL (ref 0–0.1)
BASOS PCT: 1 %
EOS PCT: 8 %
Eosinophils Absolute: 0.5 10*3/uL (ref 0–0.7)
HCT: 30.9 % — ABNORMAL LOW (ref 35.0–47.0)
Hemoglobin: 10 g/dL — ABNORMAL LOW (ref 12.0–16.0)
Lymphocytes Relative: 32 %
Lymphs Abs: 2.1 10*3/uL (ref 1.0–3.6)
MCH: 23.2 pg — ABNORMAL LOW (ref 26.0–34.0)
MCHC: 32.3 g/dL (ref 32.0–36.0)
MCV: 71.8 fL — AB (ref 80.0–100.0)
MONOS PCT: 10 %
Monocytes Absolute: 0.6 10*3/uL (ref 0.2–0.9)
Neutro Abs: 3.3 10*3/uL (ref 1.4–6.5)
Neutrophils Relative %: 49 %
PLATELETS: 243 10*3/uL (ref 150–440)
RBC: 4.3 MIL/uL (ref 3.80–5.20)
RDW: 18.1 % — ABNORMAL HIGH (ref 11.5–14.5)
WBC: 6.7 10*3/uL (ref 3.6–11.0)

## 2017-06-15 LAB — COMPREHENSIVE METABOLIC PANEL
ALBUMIN: 3.7 g/dL (ref 3.5–5.0)
ALK PHOS: 64 U/L (ref 38–126)
ALT: 12 U/L — ABNORMAL LOW (ref 14–54)
AST: 24 U/L (ref 15–41)
Anion gap: 7 (ref 5–15)
BILIRUBIN TOTAL: 0.3 mg/dL (ref 0.3–1.2)
BUN: 12 mg/dL (ref 6–20)
CALCIUM: 8.8 mg/dL — AB (ref 8.9–10.3)
CO2: 27 mmol/L (ref 22–32)
CREATININE: 0.76 mg/dL (ref 0.44–1.00)
Chloride: 103 mmol/L (ref 101–111)
GFR calc Af Amer: >60 mL/min (ref >60)
Glucose, Bld: 106 mg/dL — ABNORMAL HIGH (ref 65–99)
Potassium: 3.9 mmol/L (ref 3.5–5.1)
Sodium: 137 mmol/L (ref 135–145)
TOTAL PROTEIN: 6.9 g/dL (ref 6.5–8.1)

## 2017-06-15 LAB — VITAMIN B12: VITAMIN B 12: 486 pg/mL (ref 180–914)

## 2017-06-15 LAB — FOLATE: Folate: 15.3 ng/mL (ref >5.9)

## 2017-06-15 LAB — FERRITIN: Ferritin: 3 ng/mL — ABNORMAL LOW (ref 11–307)

## 2017-06-15 LAB — IRON AND TIBC
Iron: 17 ug/dL — ABNORMAL LOW (ref 28–170)
Saturation Ratios: 5 % — ABNORMAL LOW (ref 10.4–31.8)
TIBC: 379 ug/dL (ref 250–450)
UIBC: 362 ug/dL

## 2017-06-15 LAB — TSH: TSH: 1.01 u[IU]/mL (ref 0.350–4.500)

## 2017-06-15 NOTE — Progress Notes (Signed)
Hematology/Oncology Consult note [facility] [phone] Fax:([phone]  CONSULT NOTE Patient Care Team: [name], MD as PCP - General (Internal Medicine)  Referring Physician: Wyatt Mage Latricia Heft  CHIEF COMPLAINTS/PURPOSE OF CONSULTATION:  I have anemia   HISTORY OF PRESENTING ILLNESS:  Andrea Bernard 51 y.o.  female with past medical history listed as below who was referred by Dr. Deloria Lair to me for evaluation of her microcytic anemia. 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 She reports that some of her children, her father and other family members on the father's side also have the subcutaneous lesions. It was looked for evidence of NF2 and MRI was negative for acoustic neuroma, She used to follow up with [facility] neurology and genetic test of NF1/SPRED was ordered and patient did not get it done.   Patient recently was seen [facility] in clinic Dr.Lykins, Joelene Millin for acute pharyngitis was found to have anemia. She was told to follow up with her primary care provider Dr.Khan and was referred to me. Patient reports prfound fatigue. Denies weight loss, blood in stool. She is always constipated as she is on OXycontin 80mg  BID for chronic pain caused by her multiple lesions. She has an elective procedure next week for excision of her forearm lesions.   ROS:  Review of Systems  Constitutional: Positive for fatigue.  HENT:  Negative.   Eyes: Negative.   Respiratory: Negative.   Cardiovascular: Negative.   Gastrointestinal: Negative.   Endocrine: Negative.   Genitourinary: Negative.    Musculoskeletal: Negative.   Skin: Negative.        Multiple subcutaneous lesions.   Neurological: Negative.   Hematological: Negative.   Psychiatric/Behavioral: Negative.     MEDICAL HISTORY:  Past Medical History:   Diagnosis Date  . Arthritis   . GERD (gastroesophageal reflux disease)   . Neurofibromatosis [facility])     SURGICAL HISTORY: Past Surgical History:  Procedure Laterality Date  . CESAREAN SECTION     x 4  . HERNIA REPAIR    . TUBAL LIGATION      SOCIAL HISTORY: Social History   Social History  . Marital status: Married    Spouse name: N/A  . Number of children: N/A  . Years of education: N/A   Occupational History  . Not on file.   Social History Main Topics  . Smoking status: Never Smoker  . Smokeless tobacco: Never Used  . Alcohol use No  . Drug use: Yes    Types: Other-see comments     Comment: Oxycotin  . Sexual activity: Yes    Birth control/ protection: Surgical   Other Topics Concern  . Not on file   Social History Narrative  . No narrative on file    FAMILY HISTORY: Family History  Problem Relation Age of Onset  . Anemia Mother     ALLERGIES:  has No Known Allergies.  MEDICATIONS:  Current Outpatient Prescriptions  Medication Sig Dispense Refill  . famotidine (PEPCID) 20 MG tablet Take 1 tablet (20 mg total) by mouth 2 (two) times daily. 60 tablet 0  . gabapentin (NEURONTIN) 300 MG capsule Take 300 mg by mouth 3 (three) times daily.  0  . ibuprofen (ADVIL,MOTRIN) 600 MG tablet Take 600 mg by mouth.    . OXYCONTIN 80 MG 12 hr tablet Take 80 mg by mouth every 12 (twelve)  hours.  0  . pantoprazole (PROTONIX) 40 MG tablet Take 40 mg by mouth daily.     No current facility-administered medications for this visit.       [name]  PHYSICAL EXAMINATION: ECOG PERFORMANCE STATUS: 0 - Asymptomatic Vitals:   06/15/17 0856  BP: 105/65  Pulse: 67  Resp: 16  Temp: 97.6 F (36.4 C)   Filed Weights   06/15/17 0856  Weight: 166 lb 7 oz (75.5 kg)    GENERAL:  Alert, no distress and comfortable.  EYES: pallor, no icterus OROPHARYNX: no thrush or ulceration; good dentition  NECK: supple, no masses felt LYMPH:  no palpable lymphadenopathy in the cervical,  axillary or inguinal regions LUNGS: clear to auscultation and  No wheeze or crackles HEART/CVS: regular rate & rhythm and no murmurs; No lower extremity edema ABDOMEN: abdomen soft, non-tender and normal bowel sounds Musculoskeletal:no cyanosis of digits and no clubbing  PSYCH: alert & oriented x 3  NEURO: no focal motor/sensory deficits SKIN:  Multiple subcutaneous masses bilateral anterior forearm and also on her trunk and thigh.   LABORATORY DATA:  I have reviewed the data as listed Lab Results  Component Value Date   WBC 7.3 02/11/2016   HGB 9.7 (L) 02/11/2016   HCT 30.2 (L) 02/11/2016   MCV 71.3 (L) 02/11/2016   PLT 189 02/11/2016   Iron/TIBC/Ferritin/ %Sat    Component Value Date/Time   IRON 17 (L) 06/15/2017 0948   TIBC 379 06/15/2017 0948   FERRITIN 3 (L) 06/15/2017 0948   IRONPCTSAT 5 (L) 06/15/2017 0948   No results for input(s): NA, K, CL, CO2, GLUCOSE, BUN, CREATININE, CALCIUM, GFRNONAA, GFRAA, PROT, ALBUMIN, AST, ALT, ALKPHOS, BILITOT, BILIDIR, IBILI in the last 8760 hours.  RADIOGRAPHIC STUDIES: I have personally reviewed the radiological images as listed and agreed with the findings in the report. 07/03/2017 Mammogram screening negative.   ASSESSMENT & PLAN:  1. Microcytic anemia   2. Iron deficiency anemia, unspecified iron deficiency anemia type   3. History of neurofibromatosis ([place])   4. Other iron deficiency anemia   5. History of neuroblastoma   6. Chronic pain syndrome    Anemia: Will check CBC w differential, CMP,  iron/TIBC, ferritin, reticulocytes, fecal occult, TSH,  monoclonal gammopathy evaluation.  I suspect she has iron deficiency which was confirmed by the test ordered today.  Plan IV iron with venofer 200mg  twice a week x 4 doses. Infusion reactions including anaphylactic reaction was discussed with patient. Patient is willing to proceed.  Refer to GI for colonoscopy. Lynch syndrome can mimic neurofibromatosis presentation.   Eventually she  needs genetic test as well. Will visit the problem in future visits.  All questions were answered. The patient knows to call the clinic with any problems questions or concerns.  Return of visit: follow up in 3 weeks. Schedule IV iron infusion.  Thank you for this kind referral and the opportunity to participate in the care of this patient. A copy of today's note is routed to referring provider Dr.Lykins.    Earlie Server, MD, PhD Hematology Oncology [facility] Pager- [phone] 06/15/2017

## 2017-06-15 NOTE — Progress Notes (Signed)
Patient here today as a new patient for anemia  

## 2017-06-19 ENCOUNTER — Inpatient Hospital Stay: Payer: BLUE CROSS/BLUE SHIELD

## 2017-06-19 ENCOUNTER — Encounter: Payer: Self-pay | Admitting: *Deleted

## 2017-06-19 ENCOUNTER — Inpatient Hospital Stay (HOSPITAL_BASED_OUTPATIENT_CLINIC_OR_DEPARTMENT_OTHER): Payer: BLUE CROSS/BLUE SHIELD | Admitting: Oncology

## 2017-06-19 ENCOUNTER — Encounter: Payer: Self-pay | Admitting: Oncology

## 2017-06-19 VITALS — BP 130/64 | HR 65 | Temp 96.4°F | Wt 170.0 lb

## 2017-06-19 DIAGNOSIS — M1990 Unspecified osteoarthritis, unspecified site: Secondary | ICD-10-CM | POA: Diagnosis not present

## 2017-06-19 DIAGNOSIS — R5383 Other fatigue: Secondary | ICD-10-CM

## 2017-06-19 DIAGNOSIS — T402X5S Adverse effect of other opioids, sequela: Secondary | ICD-10-CM

## 2017-06-19 DIAGNOSIS — Z79899 Other long term (current) drug therapy: Secondary | ICD-10-CM | POA: Diagnosis not present

## 2017-06-19 DIAGNOSIS — Q8500 Neurofibromatosis, unspecified: Secondary | ICD-10-CM | POA: Diagnosis not present

## 2017-06-19 DIAGNOSIS — Z1509 Genetic susceptibility to other malignant neoplasm: Secondary | ICD-10-CM

## 2017-06-19 DIAGNOSIS — D509 Iron deficiency anemia, unspecified: Secondary | ICD-10-CM | POA: Diagnosis not present

## 2017-06-19 DIAGNOSIS — K5900 Constipation, unspecified: Secondary | ICD-10-CM | POA: Diagnosis not present

## 2017-06-19 DIAGNOSIS — G894 Chronic pain syndrome: Secondary | ICD-10-CM | POA: Diagnosis not present

## 2017-06-19 DIAGNOSIS — K219 Gastro-esophageal reflux disease without esophagitis: Secondary | ICD-10-CM

## 2017-06-19 DIAGNOSIS — D508 Other iron deficiency anemias: Secondary | ICD-10-CM

## 2017-06-19 DIAGNOSIS — Z85858 Personal history of malignant neoplasm of other endocrine glands: Secondary | ICD-10-CM

## 2017-06-19 DIAGNOSIS — K5903 Drug induced constipation: Secondary | ICD-10-CM

## 2017-06-19 MED ORDER — SODIUM CHLORIDE 0.9 % IV SOLN
Freq: Once | INTRAVENOUS | Status: AC
  Administered 2017-06-19: 14:00:00 via INTRAVENOUS
  Filled 2017-06-19: qty 1000

## 2017-06-19 MED ORDER — IRON SUCROSE 20 MG/ML IV SOLN
200.0000 mg | Freq: Once | INTRAVENOUS | Status: AC
  Administered 2017-06-19: 200 mg via INTRAVENOUS
  Filled 2017-06-19: qty 10

## 2017-06-19 NOTE — Progress Notes (Signed)
Patient here today for follow up.  Patient states no new concerns today  

## 2017-06-19 NOTE — Progress Notes (Signed)
Hematology/Oncology Follow up Note [facility] [phone] Fax:(336) [phone]  CONSULT NOTE Patient Care Team: [name], MD as PCP - General (Internal Medicine)  Referring Physician: Wyatt Mage Latricia Heft  CHIEF COMPLAINTS/PURPOSE OF CONSULTATION:  I have anemia   HISTORY OF PRESENTING ILLNESS:  Andrea Bernard 51 y.o.  female with past medical history listed as below who was referred by Dr. Deloria Lair to me for evaluation of her microcytic anemia. 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 She reports that some of her children, her father and other family members on the father's side also have the subcutaneous lesions. It was looked for evidence of NF2 and MRI was negative for acoustic neuroma, She used to follow up with [facility] neurology and genetic test of NF1/SPRED was ordered and patient did not get it done.   Patient recently was seen [facility] walk in clinic Dr.Lykins, Joelene Millin for acute pharyngitis was found to have anemia. She was told to follow up with her primary care provider Dr.Khan and was referred to me. Patient reports prfound fatigue. Denies weight loss, blood in stool. She is always constipated as she is on OXycontin 80mg  BID for chronic pain caused by her multiple lesions. She has an elective procedure next week for excision of her forearm lesions.   INTERVAL HISTORY Patient presents to discuss about the results and treatment.   ROS:  Review of Systems  Constitutional: Positive for fatigue.  HENT:  Negative.   Eyes: Negative.   Respiratory: Negative.   Cardiovascular: Negative.   Gastrointestinal: Negative.   Endocrine: Negative.   Genitourinary: Negative.    Musculoskeletal: Negative.   Skin: Negative.        Multiple subcutaneous lesions.   Neurological: Negative.   Hematological: Negative.    Psychiatric/Behavioral: Negative.     MEDICAL HISTORY:  Past Medical History:  Diagnosis Date  . Arthritis   . GERD (gastroesophageal reflux disease)   . Iron deficiency anemia 06/15/2017  . Neurofibromatosis [facility])     SURGICAL HISTORY: Past Surgical History:  Procedure Laterality Date  . CESAREAN SECTION     x 4  . HERNIA REPAIR    . TUBAL LIGATION      SOCIAL HISTORY: Social History   Social History  . Marital status: Married    Spouse name: N/A  . Number of children: N/A  . Years of education: N/A   Occupational History  . Not on file.   Social History Main Topics  . Smoking status: Never Smoker  . Smokeless tobacco: Never Used  . Alcohol use No  . Drug use: Yes    Types: Other-see comments     Comment: Oxycotin  . Sexual activity: Yes    Birth control/ protection: Surgical   Other Topics Concern  . Not on file   Social History Narrative  . No narrative on file    FAMILY HISTORY: Family History  Problem Relation Age of Onset  . Anemia Mother     ALLERGIES:  has No Known Allergies.  MEDICATIONS:  Current Outpatient Prescriptions  Medication Sig Dispense Refill  . famotidine (PEPCID) 20 MG tablet Take 1 tablet (20 mg total) by mouth 2 (two) times daily. 60 tablet 0  . gabapentin (NEURONTIN) 300 MG capsule Take 300 mg by mouth 3 (three) times daily.  0  . ibuprofen (ADVIL,MOTRIN) 600 MG tablet Take 600 mg  by mouth.    . OXYCONTIN 80 MG 12 hr tablet Take 80 mg by mouth every 12 (twelve) hours.  0  . pantoprazole (PROTONIX) 40 MG tablet Take 40 mg by mouth daily.     No current facility-administered medications for this visit.       [name]  PHYSICAL EXAMINATION: ECOG PERFORMANCE STATUS: 0 - Asymptomatic There were no vitals filed for this visit. There were no vitals filed for this visit.  GENERAL:  Alert, no distress and comfortable.  EYES: pallor, no icterus OROPHARYNX: no thrush or ulceration; good dentition  NECK: supple, no masses  felt LYMPH:  no palpable lymphadenopathy in the cervical, axillary or inguinal regions LUNGS: clear to auscultation and  No wheeze or crackles HEART/CVS: regular rate & rhythm and no murmurs; No lower extremity edema ABDOMEN: abdomen soft, non-tender and normal bowel sounds Musculoskeletal:no cyanosis of digits and no clubbing  PSYCH: alert & oriented x 3  NEURO: no focal motor/sensory deficits SKIN:  Multiple subcutaneous masses bilateral anterior forearm and also on her trunk and thigh.   LABORATORY DATA:  I have reviewed the data as listed Lab Results  Component Value Date   WBC 6.7 06/15/2017   HGB 10.0 (L) 06/15/2017   HCT 30.9 (L) 06/15/2017   MCV 71.8 (L) 06/15/2017   PLT 243 06/15/2017   Iron/TIBC/Ferritin/ %Sat    Component Value Date/Time   IRON 17 (L) 06/15/2017 0948   TIBC 379 06/15/2017 0948   FERRITIN 3 (L) 06/15/2017 0948   IRONPCTSAT 5 (L) 06/15/2017 0948    Recent Labs  06/15/17 0945  NA 137  K 3.9  CL 103  CO2 27  GLUCOSE 106*  BUN 12  CREATININE 0.76  CALCIUM 8.8*  GFRNONAA >60  GFRAA >60  PROT 6.9  ALBUMIN 3.7  AST 24  ALT 12*  ALKPHOS 64  BILITOT 0.3   Iron/TIBC/Ferritin/ %Sat    Component Value Date/Time   IRON 17 (L) 06/15/2017 0948   TIBC 379 06/15/2017 0948   FERRITIN 3 (L) 06/15/2017 0948   IRONPCTSAT 5 (L) 06/15/2017 0948    RADIOGRAPHIC STUDIES: I have personally reviewed the radiological images as listed and agreed with the findings in the report. 07/03/2017 Mammogram screening negative.   ASSESSMENT & PLAN:  1. Iron deficiency anemia, unspecified iron deficiency anemia type   2. History of neuroblastoma    Plan IV iron with venofer 200mg  twice a week x 4 doses. Side effects of IV iron, including Infusion reactions/anaphylactic reaction was discussed with patient. Patient is willing to proceed.  Refer to GI for colonoscopy and she already has an appointement. Lynch syndrome can mimic neurofibromatosis presentation.    [facility] multi cancer genetic test panel were ordered   All questions were answered. The patient knows to call the clinic with any problems questions or concerns.  Return of visit:  In 4 weeks with repeat cbc, iron tibc done a few days earlier.   Thank you for this kind referral and the opportunity to participate in the care of this patient. A copy of today's note is routed to referring provider Dr.Lykins.    Earlie Server, MD, PhD Hematology Oncology [facility] Pager- [phone] 06/19/2017

## 2017-06-22 ENCOUNTER — Inpatient Hospital Stay: Payer: BLUE CROSS/BLUE SHIELD | Admitting: Oncology

## 2017-06-22 ENCOUNTER — Inpatient Hospital Stay: Payer: BLUE CROSS/BLUE SHIELD

## 2017-06-22 DIAGNOSIS — Z85858 Personal history of malignant neoplasm of other endocrine glands: Secondary | ICD-10-CM | POA: Diagnosis not present

## 2017-06-22 DIAGNOSIS — D509 Iron deficiency anemia, unspecified: Secondary | ICD-10-CM | POA: Diagnosis not present

## 2017-06-22 DIAGNOSIS — M1990 Unspecified osteoarthritis, unspecified site: Secondary | ICD-10-CM | POA: Diagnosis not present

## 2017-06-22 DIAGNOSIS — G894 Chronic pain syndrome: Secondary | ICD-10-CM | POA: Diagnosis not present

## 2017-06-22 DIAGNOSIS — Z79899 Other long term (current) drug therapy: Secondary | ICD-10-CM | POA: Diagnosis not present

## 2017-06-22 DIAGNOSIS — T402X5S Adverse effect of other opioids, sequela: Secondary | ICD-10-CM | POA: Diagnosis not present

## 2017-06-22 DIAGNOSIS — K5903 Drug induced constipation: Secondary | ICD-10-CM | POA: Diagnosis not present

## 2017-06-22 DIAGNOSIS — K219 Gastro-esophageal reflux disease without esophagitis: Secondary | ICD-10-CM | POA: Diagnosis not present

## 2017-06-22 DIAGNOSIS — D508 Other iron deficiency anemias: Secondary | ICD-10-CM

## 2017-06-22 DIAGNOSIS — K5900 Constipation, unspecified: Secondary | ICD-10-CM | POA: Diagnosis not present

## 2017-06-22 DIAGNOSIS — Q8500 Neurofibromatosis, unspecified: Secondary | ICD-10-CM | POA: Diagnosis not present

## 2017-06-22 DIAGNOSIS — Z1509 Genetic susceptibility to other malignant neoplasm: Secondary | ICD-10-CM | POA: Diagnosis not present

## 2017-06-22 DIAGNOSIS — R5383 Other fatigue: Secondary | ICD-10-CM | POA: Diagnosis not present

## 2017-06-22 MED ORDER — SODIUM CHLORIDE 0.9 % IV SOLN
Freq: Once | INTRAVENOUS | Status: AC
  Administered 2017-06-22: 12:00:00 via INTRAVENOUS
  Filled 2017-06-22: qty 1000

## 2017-06-22 MED ORDER — IRON SUCROSE 20 MG/ML IV SOLN
200.0000 mg | Freq: Once | INTRAVENOUS | Status: AC
  Administered 2017-06-22: 200 mg via INTRAVENOUS
  Filled 2017-06-22: qty 10

## 2017-06-23 ENCOUNTER — Inpatient Hospital Stay: Payer: BLUE CROSS/BLUE SHIELD

## 2017-06-23 DIAGNOSIS — D1722 Benign lipomatous neoplasm of skin and subcutaneous tissue of left arm: Secondary | ICD-10-CM | POA: Diagnosis not present

## 2017-06-23 DIAGNOSIS — D179 Benign lipomatous neoplasm, unspecified: Secondary | ICD-10-CM | POA: Diagnosis not present

## 2017-06-26 ENCOUNTER — Inpatient Hospital Stay: Payer: BLUE CROSS/BLUE SHIELD

## 2017-06-26 VITALS — BP 112/75 | HR 62 | Temp 96.8°F

## 2017-06-26 DIAGNOSIS — Z1509 Genetic susceptibility to other malignant neoplasm: Secondary | ICD-10-CM | POA: Diagnosis not present

## 2017-06-26 DIAGNOSIS — T402X5S Adverse effect of other opioids, sequela: Secondary | ICD-10-CM | POA: Diagnosis not present

## 2017-06-26 DIAGNOSIS — K5903 Drug induced constipation: Secondary | ICD-10-CM | POA: Diagnosis not present

## 2017-06-26 DIAGNOSIS — Z79899 Other long term (current) drug therapy: Secondary | ICD-10-CM | POA: Diagnosis not present

## 2017-06-26 DIAGNOSIS — R5383 Other fatigue: Secondary | ICD-10-CM | POA: Diagnosis not present

## 2017-06-26 DIAGNOSIS — D509 Iron deficiency anemia, unspecified: Secondary | ICD-10-CM | POA: Diagnosis not present

## 2017-06-26 DIAGNOSIS — M1990 Unspecified osteoarthritis, unspecified site: Secondary | ICD-10-CM | POA: Diagnosis not present

## 2017-06-26 DIAGNOSIS — G894 Chronic pain syndrome: Secondary | ICD-10-CM | POA: Diagnosis not present

## 2017-06-26 DIAGNOSIS — K219 Gastro-esophageal reflux disease without esophagitis: Secondary | ICD-10-CM | POA: Diagnosis not present

## 2017-06-26 DIAGNOSIS — Q8500 Neurofibromatosis, unspecified: Secondary | ICD-10-CM | POA: Diagnosis not present

## 2017-06-26 DIAGNOSIS — D508 Other iron deficiency anemias: Secondary | ICD-10-CM

## 2017-06-26 DIAGNOSIS — Z85858 Personal history of malignant neoplasm of other endocrine glands: Secondary | ICD-10-CM | POA: Diagnosis not present

## 2017-06-26 DIAGNOSIS — K5900 Constipation, unspecified: Secondary | ICD-10-CM | POA: Diagnosis not present

## 2017-06-26 MED ORDER — SODIUM CHLORIDE 0.9 % IV SOLN
Freq: Once | INTRAVENOUS | Status: AC
  Administered 2017-06-26: 14:00:00 via INTRAVENOUS
  Filled 2017-06-26: qty 1000

## 2017-06-26 MED ORDER — IRON SUCROSE 20 MG/ML IV SOLN
200.0000 mg | Freq: Once | INTRAVENOUS | Status: AC
  Administered 2017-06-26: 200 mg via INTRAVENOUS
  Filled 2017-06-26 (×2): qty 10

## 2017-06-29 DIAGNOSIS — Q8500 Neurofibromatosis, unspecified: Secondary | ICD-10-CM | POA: Diagnosis not present

## 2017-06-29 DIAGNOSIS — R300 Dysuria: Secondary | ICD-10-CM | POA: Diagnosis not present

## 2017-06-29 DIAGNOSIS — N390 Urinary tract infection, site not specified: Secondary | ICD-10-CM | POA: Diagnosis not present

## 2017-06-29 DIAGNOSIS — B373 Candidiasis of vulva and vagina: Secondary | ICD-10-CM | POA: Diagnosis not present

## 2017-07-03 ENCOUNTER — Inpatient Hospital Stay: Payer: BLUE CROSS/BLUE SHIELD

## 2017-07-03 VITALS — BP 110/69 | HR 57 | Temp 98.6°F | Resp 20

## 2017-07-03 DIAGNOSIS — D508 Other iron deficiency anemias: Secondary | ICD-10-CM

## 2017-07-03 DIAGNOSIS — T402X5S Adverse effect of other opioids, sequela: Secondary | ICD-10-CM | POA: Diagnosis not present

## 2017-07-03 DIAGNOSIS — D509 Iron deficiency anemia, unspecified: Secondary | ICD-10-CM | POA: Diagnosis not present

## 2017-07-03 DIAGNOSIS — R5383 Other fatigue: Secondary | ICD-10-CM | POA: Diagnosis not present

## 2017-07-03 DIAGNOSIS — K5903 Drug induced constipation: Secondary | ICD-10-CM | POA: Diagnosis not present

## 2017-07-03 DIAGNOSIS — Q8500 Neurofibromatosis, unspecified: Secondary | ICD-10-CM | POA: Diagnosis not present

## 2017-07-03 DIAGNOSIS — Z1509 Genetic susceptibility to other malignant neoplasm: Secondary | ICD-10-CM | POA: Diagnosis not present

## 2017-07-03 DIAGNOSIS — M1990 Unspecified osteoarthritis, unspecified site: Secondary | ICD-10-CM | POA: Diagnosis not present

## 2017-07-03 DIAGNOSIS — Z85858 Personal history of malignant neoplasm of other endocrine glands: Secondary | ICD-10-CM | POA: Diagnosis not present

## 2017-07-03 DIAGNOSIS — Z79899 Other long term (current) drug therapy: Secondary | ICD-10-CM | POA: Diagnosis not present

## 2017-07-03 DIAGNOSIS — K219 Gastro-esophageal reflux disease without esophagitis: Secondary | ICD-10-CM | POA: Diagnosis not present

## 2017-07-03 DIAGNOSIS — K5900 Constipation, unspecified: Secondary | ICD-10-CM | POA: Diagnosis not present

## 2017-07-03 DIAGNOSIS — G894 Chronic pain syndrome: Secondary | ICD-10-CM | POA: Diagnosis not present

## 2017-07-03 MED ORDER — IRON SUCROSE 20 MG/ML IV SOLN
200.0000 mg | Freq: Once | INTRAVENOUS | Status: AC
  Administered 2017-07-03: 200 mg via INTRAVENOUS
  Filled 2017-07-03: qty 10

## 2017-07-03 MED ORDER — SODIUM CHLORIDE 0.9 % IV SOLN
Freq: Once | INTRAVENOUS | Status: AC
  Administered 2017-07-03: 14:00:00 via INTRAVENOUS
  Filled 2017-07-03: qty 1000

## 2017-07-09 ENCOUNTER — Other Ambulatory Visit: Payer: Self-pay

## 2017-07-09 ENCOUNTER — Inpatient Hospital Stay: Payer: BLUE CROSS/BLUE SHIELD

## 2017-07-09 ENCOUNTER — Ambulatory Visit: Payer: BLUE CROSS/BLUE SHIELD | Admitting: Gastroenterology

## 2017-07-09 ENCOUNTER — Inpatient Hospital Stay: Payer: BLUE CROSS/BLUE SHIELD | Attending: Oncology

## 2017-07-09 DIAGNOSIS — T402X5S Adverse effect of other opioids, sequela: Secondary | ICD-10-CM | POA: Insufficient documentation

## 2017-07-09 DIAGNOSIS — K219 Gastro-esophageal reflux disease without esophagitis: Secondary | ICD-10-CM | POA: Diagnosis not present

## 2017-07-09 DIAGNOSIS — Q8500 Neurofibromatosis, unspecified: Secondary | ICD-10-CM | POA: Insufficient documentation

## 2017-07-09 DIAGNOSIS — G8929 Other chronic pain: Secondary | ICD-10-CM | POA: Diagnosis not present

## 2017-07-09 DIAGNOSIS — D508 Other iron deficiency anemias: Secondary | ICD-10-CM

## 2017-07-09 DIAGNOSIS — D509 Iron deficiency anemia, unspecified: Secondary | ICD-10-CM | POA: Insufficient documentation

## 2017-07-09 DIAGNOSIS — M1990 Unspecified osteoarthritis, unspecified site: Secondary | ICD-10-CM | POA: Diagnosis not present

## 2017-07-09 DIAGNOSIS — Z79899 Other long term (current) drug therapy: Secondary | ICD-10-CM | POA: Insufficient documentation

## 2017-07-09 DIAGNOSIS — K5903 Drug induced constipation: Secondary | ICD-10-CM | POA: Diagnosis not present

## 2017-07-09 LAB — CBC WITH DIFFERENTIAL/PLATELET
Basophils Absolute: 0 10*3/uL (ref 0–0.1)
Basophils Relative: 0 %
EOS PCT: 7 %
Eosinophils Absolute: 0.3 10*3/uL (ref 0–0.7)
HEMATOCRIT: 36.5 % (ref 35.0–47.0)
Hemoglobin: 11.9 g/dL — ABNORMAL LOW (ref 12.0–16.0)
LYMPHS PCT: 44 %
Lymphs Abs: 2.2 10*3/uL (ref 1.0–3.6)
MCH: 25.2 pg — ABNORMAL LOW (ref 26.0–34.0)
MCHC: 32.5 g/dL (ref 32.0–36.0)
MCV: 77.4 fL — ABNORMAL LOW (ref 80.0–100.0)
MONO ABS: 0.5 10*3/uL (ref 0.2–0.9)
MONOS PCT: 9 %
NEUTROS ABS: 2 10*3/uL (ref 1.4–6.5)
Neutrophils Relative %: 40 %
Platelets: 260 10*3/uL (ref 150–440)
RBC: 4.72 MIL/uL (ref 3.80–5.20)
RDW: 23.6 % — ABNORMAL HIGH (ref 11.5–14.5)
WBC: 5 10*3/uL (ref 3.6–11.0)

## 2017-07-09 LAB — IRON AND TIBC
Iron: 44 ug/dL (ref 28–170)
SATURATION RATIOS: 14 % (ref 10.4–31.8)
TIBC: 327 ug/dL (ref 250–450)
UIBC: 283 ug/dL

## 2017-07-15 ENCOUNTER — Encounter: Payer: Self-pay | Admitting: Oncology

## 2017-07-15 ENCOUNTER — Inpatient Hospital Stay (HOSPITAL_BASED_OUTPATIENT_CLINIC_OR_DEPARTMENT_OTHER): Payer: BLUE CROSS/BLUE SHIELD | Admitting: Oncology

## 2017-07-15 ENCOUNTER — Encounter: Payer: Self-pay | Admitting: *Deleted

## 2017-07-15 VITALS — BP 114/78 | HR 69 | Temp 97.5°F | Wt 163.0 lb

## 2017-07-15 DIAGNOSIS — D509 Iron deficiency anemia, unspecified: Secondary | ICD-10-CM

## 2017-07-15 DIAGNOSIS — T402X5S Adverse effect of other opioids, sequela: Secondary | ICD-10-CM

## 2017-07-15 DIAGNOSIS — Z79899 Other long term (current) drug therapy: Secondary | ICD-10-CM | POA: Diagnosis not present

## 2017-07-15 DIAGNOSIS — K219 Gastro-esophageal reflux disease without esophagitis: Secondary | ICD-10-CM

## 2017-07-15 DIAGNOSIS — K5903 Drug induced constipation: Secondary | ICD-10-CM | POA: Diagnosis not present

## 2017-07-15 DIAGNOSIS — Q8500 Neurofibromatosis, unspecified: Secondary | ICD-10-CM | POA: Diagnosis not present

## 2017-07-15 DIAGNOSIS — M1990 Unspecified osteoarthritis, unspecified site: Secondary | ICD-10-CM | POA: Diagnosis not present

## 2017-07-15 DIAGNOSIS — G8929 Other chronic pain: Secondary | ICD-10-CM | POA: Diagnosis not present

## 2017-07-15 NOTE — Progress Notes (Signed)
Hematology/Oncology Follow up Note [facility] Telephone:(336) [phone] Fax:(336) [phone]  CONSULT NOTE Patient Care Team: [name], MD as PCP - General (Internal Medicine)  Referring Physician: Mickel Duhamel  CHIEF COMPLAINTS/REASON FOR VISIT  Follow up for treatment of anemia and discussion about her genetic test results.    HISTORY OF PRESENTING ILLNESS:  Andrea Bernard 51 y.o.  female with past medical history listed presents for follow up for anemia treatment and genetic tests.  Her medical history was reviewed and listed as below. 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 She reports that some of her children, her father and other family members on the father's side also have the subcutaneous lesions. It was looked for evidence of NF2 and MRI was negative for acoustic neuroma, She used to follow up with [facility] neurology and genetic test of NF1/SPRED was ordered and patient did not get it done.  Patient recently was seen [facility] walk in clinic Dr.Lykins, Joelene Millin for acute pharyngitis was found to have anemia. She was told to follow up with her primary care provider Dr.Khan and was referred to me. Patient reports prfound fatigue. Denies weight loss, blood in stool. She is always constipated as she is on OXycontin 108m BID for chronic pain caused by her multiple lesions. She has an elective procedure next week for excision of her forearm lesions.    She is s/p IV Venofer 2073mweekly x 4 doses. She feels that her energy level has slightly improved but not much. During the interval she has had additional mass resected 06/23/2017 and has had sinus infection. She feels that she has not fully recovered from her infection. Husband presents in the clinic.   Review of Systems  Constitutional: Negative for fever, night  sweats,unintentional weight loss, change in appetite. (+) Fatigue HENT: Negative for ear pain, hearing loss, nasal bleeding. (+) sinus infection.  Eyes: Negative for eye pain, double vision   Respiratory: Negative for wheezing, shortness of breath, cough Cardiovascular: Negative for chest pain, palpitation.   Gastrointestinal: Negative abdominal pain, diarrhea, nausea vomiting Endocrine: Negative  Genitourinary: Negative for dysuria, hematuria, frequency Skin: Negative for rash, iching, bruising Neurological: Negative for headache, dizziness, seizure Hematological: Negative for easy bruising/bleeding, lymph node enlargement Psychiatric/Behavioral: Negative for depression, anxiety, suicidality  MEDICAL HISTORY:  Past Medical History:  Diagnosis Date  . Arthritis   . GERD (gastroesophageal reflux disease)   . Iron deficiency anemia 06/15/2017  [facility]    SURGICAL HISTORY: Past Surgical History:  Procedure Laterality Date  . CESAREAN SECTION     x 4  . HERNIA REPAIR    . TUBAL LIGATION      SOCIAL HISTORY: Social History   Social History  . Marital status: Married    Spouse name: N/A  . Number of children: N/A  . Years of education: N/A   Occupational History  . Not on file.   Social History Main Topics  . Smoking status: Never Smoker  . Smokeless tobacco: Never Used  . Alcohol use No  . Drug use: Yes    Types: Other-see comments     Comment: Oxycotin  . Sexual activity: Yes    Birth control/ protection: Surgical   Other Topics Concern  . Not on file   Social History Narrative  . No narrative on file    FAMILY HISTORY: Family  History  Problem Relation Age of Onset  . Anemia Mother     ALLERGIES:  has No Known Allergies.  MEDICATIONS:  Current Outpatient Prescriptions  Medication Sig Dispense Refill  . famotidine (PEPCID) 20 MG tablet Take 1 tablet (20 mg total) by mouth 2 (two) times daily. 60 tablet 0  . gabapentin (NEURONTIN)  300 MG capsule Take 300 mg by mouth 3 (three) times daily.  0  . ibuprofen (ADVIL,MOTRIN) 600 MG tablet Take 600 mg by mouth.    . OXYCONTIN 80 MG 12 hr tablet Take 80 mg by mouth every 12 (twelve) hours.  0  . pantoprazole (PROTONIX) 40 MG tablet Take 40 mg by mouth daily.     No current facility-administered medications for this visit.       [name]  PHYSICAL EXAMINATION: ECOG PERFORMANCE STATUS: 0 - Asymptomatic Vitals:   07/15/17 0841  BP: 114/78  Pulse: 69  Temp: (!) 97.5 F (36.4 C)   Filed Weights   07/15/17 0841  Weight: 163 lb (73.9 kg)   GENERAL: No distress, well nourished.  SKIN:  Multiple subcutaneous masses bilateral anterior forearm and also on her trunk and thigh.  HEAD: Normocephalic, No masses, lesions, tenderness or abnormalities  EYES: Conjunctiva are pink, non icteric ENT: External ears normal ,lips , buccal mucosa, and tongue normal and mucous membranes are moist  LYMPH: No palpable cervical and axillary lymphadenopathy  LUNGS: Clear to auscultation, no crackles or wheezes HEART: Regular rate & rhythm, no murmurs, no gallops, S1 normal and S2 normal  ABDOMEN: Abdomen soft, non-tender, normal bowel sounds, I did not appreciate any  masses or organomegaly  MUSCULOSKELETAL: No CVA tenderness and no tenderness on percussion of the back or rib cage.  EXTREMITIES: No edema, no skin discoloration or tenderness NEURO: Alert & oriented, no focal motor/sensory deficits. LABORATORY DATA:  I have reviewed the data as listed Lab Results  Component Value Date   WBC 5.0 07/09/2017   HGB 11.9 (L) 07/09/2017   HCT 36.5 07/09/2017   MCV 77.4 (L) 07/09/2017   PLT 260 07/09/2017   Iron/TIBC/Ferritin/ %Sat    Component Value Date/Time   IRON 44 07/09/2017 1607   TIBC 327 07/09/2017 1607   FERRITIN 3 (L) 06/15/2017 0948   IRONPCTSAT 14 07/09/2017 1607    Recent Labs  06/15/17 0945  NA 137  K 3.9  CL 103  CO2 27  GLUCOSE 106*  BUN 12  CREATININE 0.76  CALCIUM  8.8*  GFRNONAA >60  GFRAA >60  PROT 6.9  ALBUMIN 3.7  AST 24  ALT 12*  ALKPHOS 64  BILITOT 0.3   Iron/TIBC/Ferritin/ %Sat    Component Value Date/Time   IRON 44 07/09/2017 1607   TIBC 327 07/09/2017 1607   FERRITIN 3 (L) 06/15/2017 0948   IRONPCTSAT 14 07/09/2017 1607    [facility] genetic test results (sample collected on9/18/2018)  Variants of uncertain Significance identified in BRCA 2 and NF1  RADIOGRAPHIC STUDIES: I have personally reviewed the radiological images as listed and agreed with the findings in the report. 07/03/2017 Mammogram screening negative.   ASSESSMENT & PLAN:  1. Iron deficiency anemia, unspecified iron deficiency anemia type   2. History of neurofibromatosis ([place])   3. Microcytic anemia    S/p  IV iron with venofer 258m twice a week x 4 doses. Hemoglobin improved to 11.7, still microcytic anemia. Will give additional 2 doses of Venofer 203mweekly.   Refer to GI for colonoscopy and she already has an  appointement.  Genetic VUS of BRAC2 and NF1 results were discussed with patient and I will refer her to talk to genetic counselor for additional information to see if her breast cancer screen should be affected.  VUS NF1, however, clinical presentation of neurofibromatosis. She has neurologist from [facility] but she prefers to establish care with local neurologist. Refer to neurology for further evaluation.  All questions were answered. The patient knows to call the clinic with any problems questions or concerns.  Return of visit:  In 4 weeks with repeat cbc, iron tibc done a few days earlier.   Thank you for this kind referral and the opportunity to participate in the care of this patient. A copy of today's note is routed to referring provider Dr.Lykins.    Earlie Server, MD, PhD Hematology Oncology [facility] Pager- [phone] 07/15/2017

## 2017-07-15 NOTE — Progress Notes (Signed)
Patient here today for follow up.   

## 2017-07-17 ENCOUNTER — Other Ambulatory Visit: Payer: BLUE CROSS/BLUE SHIELD

## 2017-07-20 ENCOUNTER — Ambulatory Visit: Payer: BLUE CROSS/BLUE SHIELD | Admitting: Oncology

## 2017-07-21 DIAGNOSIS — K439 Ventral hernia without obstruction or gangrene: Secondary | ICD-10-CM | POA: Diagnosis not present

## 2017-07-21 DIAGNOSIS — R1909 Other intra-abdominal and pelvic swelling, mass and lump: Secondary | ICD-10-CM | POA: Diagnosis not present

## 2017-07-24 ENCOUNTER — Other Ambulatory Visit: Payer: Self-pay | Admitting: Hematology and Oncology

## 2017-07-24 ENCOUNTER — Inpatient Hospital Stay: Payer: BLUE CROSS/BLUE SHIELD

## 2017-07-24 VITALS — BP 104/68 | HR 59 | Temp 97.8°F | Resp 18

## 2017-07-24 DIAGNOSIS — T402X5S Adverse effect of other opioids, sequela: Secondary | ICD-10-CM | POA: Diagnosis not present

## 2017-07-24 DIAGNOSIS — Q8500 Neurofibromatosis, unspecified: Secondary | ICD-10-CM | POA: Diagnosis not present

## 2017-07-24 DIAGNOSIS — K5903 Drug induced constipation: Secondary | ICD-10-CM | POA: Diagnosis not present

## 2017-07-24 DIAGNOSIS — M545 Low back pain: Secondary | ICD-10-CM | POA: Diagnosis not present

## 2017-07-24 DIAGNOSIS — M25551 Pain in right hip: Secondary | ICD-10-CM | POA: Diagnosis not present

## 2017-07-24 DIAGNOSIS — D508 Other iron deficiency anemias: Secondary | ICD-10-CM

## 2017-07-24 DIAGNOSIS — K219 Gastro-esophageal reflux disease without esophagitis: Secondary | ICD-10-CM | POA: Diagnosis not present

## 2017-07-24 DIAGNOSIS — M5412 Radiculopathy, cervical region: Secondary | ICD-10-CM | POA: Diagnosis not present

## 2017-07-24 DIAGNOSIS — G894 Chronic pain syndrome: Secondary | ICD-10-CM | POA: Diagnosis not present

## 2017-07-24 DIAGNOSIS — M1990 Unspecified osteoarthritis, unspecified site: Secondary | ICD-10-CM | POA: Diagnosis not present

## 2017-07-24 DIAGNOSIS — D509 Iron deficiency anemia, unspecified: Secondary | ICD-10-CM | POA: Diagnosis not present

## 2017-07-24 DIAGNOSIS — Z79899 Other long term (current) drug therapy: Secondary | ICD-10-CM | POA: Diagnosis not present

## 2017-07-24 DIAGNOSIS — G8929 Other chronic pain: Secondary | ICD-10-CM | POA: Diagnosis not present

## 2017-07-24 MED ORDER — IRON SUCROSE 20 MG/ML IV SOLN
200.0000 mg | INTRAVENOUS | Status: DC
  Administered 2017-07-24: 200 mg via INTRAVENOUS
  Filled 2017-07-24: qty 10

## 2017-07-31 ENCOUNTER — Inpatient Hospital Stay: Payer: BLUE CROSS/BLUE SHIELD

## 2017-07-31 ENCOUNTER — Other Ambulatory Visit: Payer: Self-pay | Admitting: Hematology and Oncology

## 2017-07-31 MED ORDER — IRON SUCROSE 20 MG/ML IV SOLN
200.0000 mg | INTRAVENOUS | Status: AC
  Filled 2017-07-31: qty 10

## 2017-07-31 MED ORDER — SODIUM CHLORIDE 0.9% FLUSH
10.0000 mL | INTRAVENOUS | Status: AC | PRN
  Filled 2017-07-31: qty 10

## 2017-08-04 DIAGNOSIS — B373 Candidiasis of vulva and vagina: Secondary | ICD-10-CM | POA: Diagnosis not present

## 2017-08-04 DIAGNOSIS — N390 Urinary tract infection, site not specified: Secondary | ICD-10-CM | POA: Diagnosis not present

## 2017-08-04 DIAGNOSIS — Z6828 Body mass index (BMI) 28.0-28.9, adult: Secondary | ICD-10-CM | POA: Diagnosis not present

## 2017-08-07 ENCOUNTER — Inpatient Hospital Stay: Payer: BLUE CROSS/BLUE SHIELD | Attending: Oncology

## 2017-08-07 ENCOUNTER — Other Ambulatory Visit: Payer: Self-pay | Admitting: Oncology

## 2017-08-07 DIAGNOSIS — D509 Iron deficiency anemia, unspecified: Secondary | ICD-10-CM | POA: Diagnosis not present

## 2017-08-07 DIAGNOSIS — D508 Other iron deficiency anemias: Secondary | ICD-10-CM

## 2017-08-07 MED ORDER — IRON SUCROSE 20 MG/ML IV SOLN
200.0000 mg | INTRAVENOUS | Status: DC
  Administered 2017-08-07: 200 mg via INTRAVENOUS
  Filled 2017-08-07: qty 10

## 2017-08-07 MED ORDER — SODIUM CHLORIDE 0.9 % IV SOLN
INTRAVENOUS | Status: DC
  Administered 2017-08-07: 15:00:00 via INTRAVENOUS
  Filled 2017-08-07: qty 1000

## 2017-08-10 ENCOUNTER — Encounter: Payer: Self-pay | Admitting: Gastroenterology

## 2017-08-10 ENCOUNTER — Ambulatory Visit: Payer: BLUE CROSS/BLUE SHIELD | Admitting: Gastroenterology

## 2017-08-10 VITALS — BP 88/55 | HR 57 | Temp 97.8°F | Ht 64.5 in | Wt 163.0 lb

## 2017-08-10 DIAGNOSIS — D509 Iron deficiency anemia, unspecified: Secondary | ICD-10-CM

## 2017-08-10 DIAGNOSIS — Z791 Long term (current) use of non-steroidal anti-inflammatories (NSAID): Secondary | ICD-10-CM | POA: Diagnosis not present

## 2017-08-10 NOTE — Progress Notes (Signed)
[name] MD, MRCP(U.K) [address]  [place]  [place], [place] [address]  Main: [phone]  Fax: [phone]   Gastroenterology Consultation  Referring Provider:     [name], MD Primary Care Physician:  [name], MD Primary Gastroenterologist:  Dr. [name]  Reason for Consultation:     Iron deficiency anemia         HPI:   Andrea Bernard is a 51 y.o. y/o female referred for consultation & management  by Dr. Humphrey Rolls, Timoteo Gaul, MD.    She has been referred by Dr Tasia Catchings for iron deficiency anemia. On long trerm oxycodone . Being evaluated for NF1. On IV iron .B12 normal  . 18 months back had difficulty breathing, her hips and neck have hurt from age 86 and would "eat motrin like candy", had some indigestion and thought her motrin may have been the issue a few years back , was started on pantoprazole- had an upper gi series and sent home. Last year says she was in the ER and could not breathe and swallow , says she was having issues with stomach acid, checked her iron and didn't tell her the result. Unclear what happened from 2017 when she anemic as to why she didn't have any evaluation. Feels much better after starting IV iron   CBC Latest Ref Rng & Units 07/09/2017 06/15/2017 02/11/2016  WBC 3.6 - 11.0 K/uL 5.0 6.7 7.3  Hemoglobin 12.0 - 16.0 g/dL 11.9(L) 10.0(L) 9.7(L)  Hematocrit 35.0 - 47.0 % 36.5 30.9(L) 30.2(L)  Platelets 150 - 440 K/uL 260 243 189   Ferritin 3  Rectal bleeding: she has been oxycodone for pain for 21 years- constipated- occasionally sees "darker stool" and some blood. Still on motrin every other day , still on PPI Nose bleeds: none  Vaginal bleeding : every month has period- lasts 4 days, changes 2-3 mildly soaked Hematemesis or hemoptysis : none  Blood in urine : none  Never had a colonoscopy, no family history of colon cancer or polyps.    Past Medical History:  Diagnosis Date  . Arthritis   . GERD (gastroesophageal reflux disease)     . Iron deficiency anemia 06/15/2017  . Neurofibromatosis [facility])     Past Surgical History:  Procedure Laterality Date  . CESAREAN SECTION     x 4  . HERNIA REPAIR    . TUBAL LIGATION      Prior to Admission medications   Medication Sig Start Date End Date Taking? Authorizing Provider  famotidine (PEPCID) 20 MG tablet Take 1 tablet (20 mg total) by mouth 2 (two) times daily. 02/11/16   Paulette Blanch, MD  gabapentin (NEURONTIN) 300 MG capsule Take 300 mg by mouth 3 (three) times daily. 01/29/16   [provider]  ibuprofen (ADVIL,MOTRIN) 600 MG tablet Take 600 mg by mouth. 08/19/16   [provider]  nitrofurantoin, macrocrystal-monohydrate, (MACROBID) 100 MG capsule Take 100 mg by mouth 2 (two) times daily. 07/06/17   [provider]  OXYCONTIN 80 MG 12 hr tablet Take 80 mg by mouth every 12 (twelve) hours. 01/29/16   [provider]  pantoprazole (PROTONIX) 40 MG tablet Take 40 mg by mouth daily.    [provider]    Family History  Problem Relation Age of Onset  . Anemia Mother      Social History   Tobacco Use  . Smoking status: Never Smoker  . Smokeless tobacco: Never Used  Substance  Use Topics  . Alcohol use: No  . Drug use: Yes    Types: Other-see comments    Comment: Oxycotin    Allergies as of 08/10/2017  . (No Known Allergies)    Review of Systems:    All systems reviewed and negative except where noted in HPI.   Physical Exam:  There were no vitals taken for this visit. No LMP recorded. Patient is perimenopausal. Psych:  Alert and cooperative. Normal mood and affect. General:   Alert,  Well-developed, well-nourished, pleasant and cooperative in NAD Head:  Normocephalic and atraumatic. Eyes:  Sclera clear, no icterus.   Conjunctiva pink. Ears:  Normal auditory acuity. Nose:  No deformity, discharge, or lesions. Mouth:  No deformity or lesions,oropharynx pink & moist. Neck:  Supple; no masses or  thyromegaly. Lungs:  Respirations even and unlabored.  Clear throughout to auscultation.   No wheezes, crackles, or rhonchi. No acute distress. Heart:  Regular rate and rhythm; no murmurs, clicks, rubs, or gallops. Abdomen:  Normal bowel sounds.  No bruits.  Soft, non-tender and non-distended without masses, hepatosplenomegaly or hernias noted.  No guarding or rebound tenderness.    Msk:  Symmetrical without gross deformities. Good, equal movement & strength bilaterally. Pulses:  Normal pulses noted. Extremities:  No clubbing or edema.  No cyanosis. Neurologic:  Alert and oriented x3;  grossly normal neurologically. Skin:  Intact without significant lesions or rashes. No jaundice. Lymph Nodes:  No significant cervical adenopathy. Psych:  Alert and cooperative. Normal mood and affect.  Imaging Studies: No results found.  Assessment and Plan:   Andrea Bernard is a 50 y.o. y/o female has been referred for iron deficiency anemia. Check celiac serology . EGD+colonoscopy and if negative will need capsule study of the small bowel .   Stop all NSAID's Continue PPI Miralax daily for constipation   I have discussed risks & benefits of the procedure  which include, but are not limited to, bleeding, infection, perforation,respiratory compromise & drug reaction.  The patient agrees with this plan & written consent will be obtained.    Follow up in 6-8 weeks   Dr [name] MD,MRCP(U.K)

## 2017-08-13 ENCOUNTER — Inpatient Hospital Stay: Payer: BLUE CROSS/BLUE SHIELD | Admitting: *Deleted

## 2017-08-13 DIAGNOSIS — D509 Iron deficiency anemia, unspecified: Secondary | ICD-10-CM

## 2017-08-13 LAB — CBC WITH DIFFERENTIAL/PLATELET
BASOS PCT: 1 %
Basophils Absolute: 0.1 10*3/uL (ref 0–0.1)
EOS ABS: 0.4 10*3/uL (ref 0–0.7)
Eosinophils Relative: 9 %
HCT: 39.6 % (ref 35.0–47.0)
HEMOGLOBIN: 13.2 g/dL (ref 12.0–16.0)
Lymphocytes Relative: 34 %
Lymphs Abs: 1.5 10*3/uL (ref 1.0–3.6)
MCH: 26.8 pg (ref 26.0–34.0)
MCHC: 33.4 g/dL (ref 32.0–36.0)
MCV: 80.2 fL (ref 80.0–100.0)
Monocytes Absolute: 0.4 10*3/uL (ref 0.2–0.9)
Monocytes Relative: 8 %
NEUTROS PCT: 48 %
Neutro Abs: 2.2 10*3/uL (ref 1.4–6.5)
PLATELETS: 210 10*3/uL (ref 150–440)
RBC: 4.93 MIL/uL (ref 3.80–5.20)
RDW: 20.2 % — ABNORMAL HIGH (ref 11.5–14.5)
WBC: 4.6 10*3/uL (ref 3.6–11.0)

## 2017-08-13 LAB — URINALYSIS, COMPLETE (UACMP) WITH MICROSCOPIC
BILIRUBIN URINE: NEGATIVE
Bacteria, UA: NONE SEEN
GLUCOSE, UA: NEGATIVE mg/dL
HGB URINE DIPSTICK: NEGATIVE
Ketones, ur: NEGATIVE mg/dL
Leukocytes, UA: NEGATIVE
NITRITE: NEGATIVE
Protein, ur: NEGATIVE mg/dL
Specific Gravity, Urine: 1.015 (ref 1.005–1.030)
pH: 7 (ref 5.0–8.0)

## 2017-08-13 LAB — IRON AND TIBC
Iron: 82 ug/dL (ref 28–170)
SATURATION RATIOS: 31 % (ref 10.4–31.8)
TIBC: 261 ug/dL (ref 250–450)
UIBC: 179 ug/dL

## 2017-08-13 LAB — FERRITIN: Ferritin: 93 ng/mL (ref 11–307)

## 2017-08-15 LAB — H. PYLORI ANTIGEN, STOOL: H. PYLORI STOOL AG, EIA: NEGATIVE

## 2017-08-16 LAB — CELIAC PANEL 10
ANTIGLIADIN ABS, IGA: 9 U (ref 0–19)
ENDOMYSIAL ANTIBODY IGA: NEGATIVE
Gliadin IgG: 3 units (ref 0–19)
IgA: 226 mg/dL (ref 87–352)
Tissue Transglut Ab: <2 U/mL (ref 0–5)
Tissue Transglutaminase Ab, IgA: <2 U/mL (ref 0–3)

## 2017-08-19 ENCOUNTER — Telehealth: Payer: Self-pay | Admitting: Gastroenterology

## 2017-08-19 ENCOUNTER — Encounter: Payer: Self-pay | Admitting: Gastroenterology

## 2017-08-19 ENCOUNTER — Other Ambulatory Visit: Payer: Self-pay

## 2017-08-19 ENCOUNTER — Telehealth: Payer: Self-pay

## 2017-08-19 NOTE — Addendum Note (Signed)
Addended by: Peggye Ley on: 08/19/2017 04:03 PM   Modules accepted: Orders, SmartSet

## 2017-08-19 NOTE — Telephone Encounter (Signed)
Patient returned your call.

## 2017-08-19 NOTE — Telephone Encounter (Signed)
LVM for patient callback to schedule EGD & colonoscopy.   Labs were stable and will allow for procedures.

## 2017-09-04 ENCOUNTER — Encounter: Payer: Self-pay | Admitting: *Deleted

## 2017-09-04 ENCOUNTER — Ambulatory Visit: Payer: BLUE CROSS/BLUE SHIELD | Admitting: Anesthesiology

## 2017-09-04 ENCOUNTER — Encounter
Admission: RE | Disposition: A | Discharge status: Home / Self Care | Payer: Self-pay | Source: Ambulatory Visit | Attending: Gastroenterology

## 2017-09-04 ENCOUNTER — Ambulatory Visit
Admission: RE | Admit: 2017-09-04 | Discharge: 2017-09-04 | Disposition: A | Discharge status: Home / Self Care | Payer: BLUE CROSS/BLUE SHIELD | Source: Ambulatory Visit | Attending: Gastroenterology | Admitting: Gastroenterology

## 2017-09-04 ENCOUNTER — Other Ambulatory Visit: Payer: Self-pay

## 2017-09-04 DIAGNOSIS — Z791 Long term (current) use of non-steroidal anti-inflammatories (NSAID): Secondary | ICD-10-CM

## 2017-09-04 DIAGNOSIS — D509 Iron deficiency anemia, unspecified: Secondary | ICD-10-CM | POA: Insufficient documentation

## 2017-09-04 DIAGNOSIS — Z79899 Other long term (current) drug therapy: Secondary | ICD-10-CM | POA: Diagnosis not present

## 2017-09-04 DIAGNOSIS — K219 Gastro-esophageal reflux disease without esophagitis: Secondary | ICD-10-CM | POA: Insufficient documentation

## 2017-09-04 DIAGNOSIS — M1991 Primary osteoarthritis, unspecified site: Secondary | ICD-10-CM | POA: Diagnosis not present

## 2017-09-04 DIAGNOSIS — K317 Polyp of stomach and duodenum: Secondary | ICD-10-CM | POA: Diagnosis not present

## 2017-09-04 DIAGNOSIS — Q8500 Neurofibromatosis, unspecified: Secondary | ICD-10-CM | POA: Diagnosis not present

## 2017-09-04 DIAGNOSIS — K2970 Gastritis, unspecified, without bleeding: Secondary | ICD-10-CM | POA: Insufficient documentation

## 2017-09-04 DIAGNOSIS — R112 Nausea with vomiting, unspecified: Secondary | ICD-10-CM

## 2017-09-04 DIAGNOSIS — Z832 Family history of diseases of the blood and blood-forming organs and certain disorders involving the immune mechanism: Secondary | ICD-10-CM | POA: Diagnosis not present

## 2017-09-04 HISTORY — PX: ESOPHAGOGASTRODUODENOSCOPY (EGD) WITH PROPOFOL: SHX5813

## 2017-09-04 HISTORY — PX: COLONOSCOPY WITH PROPOFOL: SHX5780

## 2017-09-04 LAB — POCT PREGNANCY, URINE: Preg Test, Ur: NEGATIVE

## 2017-09-04 SURGERY — COLONOSCOPY WITH PROPOFOL
Anesthesia: General

## 2017-09-04 MED ORDER — SODIUM CHLORIDE 0.9 % IV SOLN
INTRAVENOUS | Status: DC
  Administered 2017-09-04: 1000 mL via INTRAVENOUS

## 2017-09-04 MED ORDER — SODIUM CHLORIDE 0.9 % IV SOLN
INTRAVENOUS | Status: DC | PRN
  Administered 2017-09-04: 07:00:00 via INTRAVENOUS

## 2017-09-04 MED ORDER — SODIUM CHLORIDE 0.9 % IJ SOLN
INTRAMUSCULAR | Status: AC
  Filled 2017-09-04: qty 10

## 2017-09-04 MED ORDER — PROPOFOL 500 MG/50ML IV EMUL
INTRAVENOUS | Status: DC | PRN
  Administered 2017-09-04: 140 ug/kg/min via INTRAVENOUS

## 2017-09-04 MED ORDER — FENTANYL CITRATE (PF) 100 MCG/2ML IJ SOLN
INTRAMUSCULAR | Status: AC
  Filled 2017-09-04: qty 2

## 2017-09-04 MED ORDER — PROPOFOL 10 MG/ML IV BOLUS
INTRAVENOUS | Status: AC
  Filled 2017-09-04: qty 20

## 2017-09-04 MED ORDER — PROPOFOL 500 MG/50ML IV EMUL
INTRAVENOUS | Status: AC
  Filled 2017-09-04: qty 50

## 2017-09-04 MED ORDER — PHENYLEPHRINE HCL 10 MG/ML IJ SOLN
INTRAMUSCULAR | Status: AC
  Filled 2017-09-04: qty 1

## 2017-09-04 MED ORDER — EPHEDRINE SULFATE 50 MG/ML IJ SOLN
INTRAMUSCULAR | Status: AC
  Filled 2017-09-04: qty 1

## 2017-09-04 MED ORDER — FENTANYL CITRATE (PF) 100 MCG/2ML IJ SOLN
INTRAMUSCULAR | Status: DC | PRN
  Administered 2017-09-04 (×2): 50 ug via INTRAVENOUS

## 2017-09-04 MED ORDER — PHENYLEPHRINE HCL 10 MG/ML IJ SOLN
INTRAMUSCULAR | Status: DC | PRN
  Administered 2017-09-04: 100 ug via INTRAVENOUS

## 2017-09-04 MED ORDER — EPHEDRINE SULFATE 50 MG/ML IJ SOLN
INTRAMUSCULAR | Status: DC | PRN
  Administered 2017-09-04: 5 mg via INTRAVENOUS

## 2017-09-04 MED ORDER — LIDOCAINE 2% (20 MG/ML) 5 ML SYRINGE
INTRAMUSCULAR | Status: DC | PRN
  Administered 2017-09-04: 40 mg via INTRAVENOUS

## 2017-09-04 MED ORDER — LIDOCAINE HCL (PF) 2 % IJ SOLN
INTRAMUSCULAR | Status: AC
  Filled 2017-09-04: qty 10

## 2017-09-04 MED ORDER — PROPOFOL 10 MG/ML IV BOLUS
INTRAVENOUS | Status: DC | PRN
  Administered 2017-09-04: 100 mg via INTRAVENOUS

## 2017-09-04 MED ORDER — ONDANSETRON HCL 4 MG PO TABS: 4.0000 mg | ORAL_TABLET | Freq: Three times a day (TID) | ORAL | 1 refills | Status: DC | PRN

## 2017-09-04 NOTE — Anesthesia Postprocedure Evaluation (Signed)
Anesthesia Post Note  Patient: Andrea Bernard  Procedure(s) Performed: COLONOSCOPY WITH PROPOFOL (N/A ) ESOPHAGOGASTRODUODENOSCOPY (EGD) WITH PROPOFOL (N/A )  Patient location during evaluation: Endoscopy Anesthesia Type: General Level of consciousness: awake and alert Pain management: pain level controlled Vital Signs Assessment: post-procedure vital signs reviewed and stable Respiratory status: spontaneous breathing and respiratory function stable Cardiovascular status: stable Anesthetic complications: no     Last Vitals:  Vitals:   09/04/17 0900 09/04/17 0920  BP: (!) 89/54 103/62  Pulse: 64 (!) 56  Resp: 16 12  Temp:    SpO2: 96% 97%    Last Pain:  Vitals:   09/04/17 0850  TempSrc: Tympanic                 KEPHART,WILLIAM K

## 2017-09-04 NOTE — Op Note (Signed)
[facility] Gastroenterology Patient Name: Andrea Bernard Procedure Date: 09/04/2017 8:11 AM MRN: [id number] Account #: [id number] Date of Birth: 09/24/1966 Admit Type: Outpatient Age: 51 Room: [facility] ENDO ROOM 4 Gender: Female Note Status: Finalized Procedure:            Colonoscopy Indications:          Iron deficiency anemia Providers:            [name] MD, MD Referring MD:         [name], MD (Referring MD) Medicines:            Monitored Anesthesia Care Complications:        No immediate complications. Procedure:            Pre-Anesthesia Assessment:                       - Prior to the procedure, a History and Physical was                        performed, and patient medications, allergies and                        sensitivities were reviewed. The patient's tolerance of                        previous anesthesia was reviewed.                       - The risks and benefits of the procedure and the                        sedation options and risks were discussed with the                        patient. All questions were answered and informed                        consent was obtained.                       - ASA Grade Assessment: II - A patient with mild                        systemic disease.                       After obtaining informed consent, the colonoscope was                        passed under direct vision. Throughout the procedure,                        the patient's blood pressure, pulse, and oxygen                        saturations were monitored continuously. The                        Colonoscope was introduced through the anus and  advanced to the the cecum, identified by appendiceal                        orifice and ileocecal valve. The colonoscopy was                        performed with ease. The patient tolerated the                        procedure well. The quality of the bowel preparation                     was poor. Findings:      The perianal and digital rectal examinations were normal.      A large amount of semi-liquid stool was found in the entire colon,       precluding visualization.      The exam was otherwise without abnormality. Impression:           - Preparation of the colon was poor.                       - Stool in the entire examined colon.                       - The examination was otherwise normal.                       - No specimens collected. Recommendation:       - Discharge patient to home (with escort).                       - Resume previous diet.                       - Continue present medications.                       - Repeat colonoscopy in 2 weeks because the bowel                        preparation was suboptimal. Procedure Code(s):    --- Professional ---                       [phone], Colonoscopy, flexible; diagnostic, including                        collection of specimen(s) by brushing or washing, when                        performed (separate procedure) Diagnosis Code(s):    --- Professional ---                       D50.9, Iron deficiency anemia, unspecified CPT copyright 2016 American Medical Association. All rights reserved. The codes documented in this report are preliminary and upon coder review may  be revised to meet current compliance requirements. [name], MD [name] MD, MD 09/04/2017 8:53:06 AM This report has been signed electronically. Number of Addenda: 0 Note Initiated On: 09/04/2017 8:11 AM Scope Withdrawal Time: 0 hours 5 minutes 27 seconds  Total Procedure Duration: 0 hours 14 minutes 58 seconds  [facility]

## 2017-09-04 NOTE — Progress Notes (Signed)
Patient states nausea when attempting to drink bowel prep.   Ordered Zofran

## 2017-09-04 NOTE — H&P (Signed)
[name], MD [address], [place], [place], [place], [address] [address], [place], [place], [place], [address] Phone: [phone]  Fax: [phone]  Primary Care Physician:  [name], MD   Pre-Procedure History & Physical: HPI:  Andrea Bernard is a 51 y.o. female is here for an endoscopy and colonoscopy    Past Medical History:  Diagnosis Date  . Arthritis   . GERD (gastroesophageal reflux disease)   . Iron deficiency anemia 06/15/2017  . Neurofibromatosis [facility])     Past Surgical History:  Procedure Laterality Date  . CESAREAN SECTION     x 4  . HERNIA REPAIR    . TUBAL LIGATION    . TUMOR REMOVAL      Prior to Admission medications   Medication Sig Start Date End Date Taking? Authorizing Provider  gabapentin (NEURONTIN) 300 MG capsule Take 300 mg by mouth 3 (three) times daily. 01/29/16  Yes [provider]  OXYCONTIN 80 MG 12 hr tablet Take 80 mg by mouth every 12 (twelve) hours. 01/29/16  Yes [provider]  pantoprazole (PROTONIX) 40 MG tablet Take 40 mg by mouth daily.   Yes [provider]  acetaminophen (TYLENOL) 325 MG tablet Take 650 mg by mouth. 08/19/16   [provider]  Phendimetrazine Tartrate 105 MG CP24 take 1 capsule by mouth daily for WEIGHT LOSS 08/08/17   [provider]    Allergies as of 08/20/2017  . (No Known Allergies)    Family History  Problem Relation Age of Onset  . Anemia Mother     Social History   Socioeconomic History  . Marital status: Married    Spouse name: Not on file  . Number of children: Not on file  . Years of education: Not on file  . Highest education level: Not on file  Social Needs  . Financial resource strain: Not on file  . Food insecurity - worry: Not on file  . Food insecurity - inability: Not on file  . Transportation needs - medical: Not on file  . Transportation needs - non-medical: Not on file  Occupational History  . Not on file    Tobacco Use  . Smoking status: Never Smoker  . Smokeless tobacco: Never Used  Substance and Sexual Activity  . Alcohol use: No  . Drug use: Yes    Types: Other-see comments    Comment: Oxycotin  . Sexual activity: Yes    Birth control/protection: Surgical  Other Topics Concern  . Not on file  Social History Narrative  . Not on file    Review of Systems: See HPI, otherwise negative ROS  Physical Exam: BP 104/65   Pulse 66   Temp 97.8 F (36.6 C) (Tympanic)   Ht 5\' 4"  (1.626 m)   Wt 163 lb (73.9 kg)   SpO2 98%   BMI 27.98 kg/m  General:   Alert,  pleasant and cooperative in NAD Head:  Normocephalic and atraumatic. Neck:  Supple; no masses or thyromegaly. Lungs:  Clear throughout to auscultation, normal respiratory effort.    Heart:  +S1, +S2, Regular rate and rhythm, No edema. Abdomen:  Soft, nontender and nondistended. Normal bowel sounds, without guarding, and without rebound.   Neurologic:  Alert and  oriented x4;  grossly normal neurologically.  Impression/Plan: Andrea Bernard is here for an endoscopy and colonoscopy  to be performed for  evaluation of iron deficiency anemai     Risks, benefits, limitations,  and alternatives regarding endoscopy have been reviewed with the patient.  Questions have been answered.  All parties agreeable.   [name], MD  09/04/2017, 8:12 AM

## 2017-09-04 NOTE — Transfer of Care (Signed)
Immediate Anesthesia Transfer of Care Note  Patient: Andrea Bernard  Procedure(s) Performed: COLONOSCOPY WITH PROPOFOL (N/A ) ESOPHAGOGASTRODUODENOSCOPY (EGD) WITH PROPOFOL (N/A )  Patient Location: PACU and Endoscopy Unit  Anesthesia Type:General  Level of Consciousness: awake and patient cooperative  Airway & Oxygen Therapy: Patient Spontanous Breathing and Patient connected to nasal cannula oxygen  Post-op Assessment: Report given to RN and Post -op Vital signs reviewed and stable  Post vital signs: Reviewed and stable  Last Vitals:  Vitals:   09/04/17 0753 09/04/17 0850  BP: 104/65 (P) 101/87  Pulse: 66 (P) 61  Temp: 36.6 C (!) (P) 35.7 C  SpO2: 98% (P) 100%    Last Pain:  Vitals:   09/04/17 0850  TempSrc: (P) Tympanic         Complications: No apparent anesthesia complications

## 2017-09-04 NOTE — Op Note (Signed)
[facility] Gastroenterology Patient Name: Andrea Bernard Procedure Date: 09/04/2017 8:12 AM MRN: [id number] Account #: [id number] Date of Birth: 10-18-1965 Admit Type: Outpatient Age: 51 Room: [facility] ENDO ROOM 4 Gender: Female Note Status: Finalized Procedure:            Upper GI endoscopy Indications:          Iron deficiency anemia Providers:            [name] MD, MD Referring MD:         [name], MD (Referring MD) Medicines:            Monitored Anesthesia Care Complications:        No immediate complications. Procedure:            Pre-Anesthesia Assessment:                       - Prior to the procedure, a History and Physical was                        performed, and patient medications, allergies and                        sensitivities were reviewed. The patient's tolerance of                        previous anesthesia was reviewed.                       - The risks and benefits of the procedure and the                        sedation options and risks were discussed with the                        patient. All questions were answered and informed                        consent was obtained.                       - ASA Grade Assessment: II - A patient with mild                        systemic disease.                       After obtaining informed consent, the endoscope was                        passed under direct vision. Throughout the procedure,                        the patient's blood pressure, pulse, and oxygen                        saturations were monitored continuously. The Endoscope                        was introduced through the mouth, and advanced to the  third part of duodenum. The upper GI endoscopy was                        accomplished with ease. The patient tolerated the                        procedure well. Findings:      The examined duodenum was normal.      The examined duodenum was normal.  Biopsies for histology were taken with       a cold forceps for evaluation of celiac disease.      The esophagus was normal.      Patchy mild inflammation characterized by congestion (edema) and       erythema was found in the gastric antrum. Biopsies were taken with a       cold forceps for histology.      A few 6 to 9 mm sessile polyps with no stigmata of recent bleeding were       found in the gastric fundus. The polyp was removed with a cold biopsy       forceps. Resection and retrieval were complete.      The exam was otherwise without abnormality. Impression:           - Normal examined duodenum.                       - Normal examined duodenum. Biopsied.                       - Normal esophagus.                       - Gastritis. Biopsied.                       - A few gastric polyps. Resected and retrieved.                       - The examination was otherwise normal. Recommendation:       - Await pathology results.                       - Perform a colonoscopy today. Procedure Code(s):    --- Professional ---                       [phone], Esophagogastroduodenoscopy, flexible, transoral;                        with biopsy, single or multiple Diagnosis Code(s):    --- Professional ---                       K29.70, Gastritis, unspecified, without bleeding                       K31.7, Polyp of stomach and duodenum                       D50.9, Iron deficiency anemia, unspecified CPT copyright 2016 American Medical Association. All rights reserved. The codes documented in this report are preliminary and upon coder review may  be revised to meet current compliance requirements. [name], MD [name] MD, MD 09/04/2017 8:34:28 AM This report  has been signed electronically. Number of Addenda: 0 Note Initiated On: 09/04/2017 8:12 AM      [facility]

## 2017-09-04 NOTE — Anesthesia Post-op Follow-up Note (Signed)
Anesthesia QCDR form completed.        

## 2017-09-04 NOTE — Anesthesia Preprocedure Evaluation (Signed)
Anesthesia Evaluation  Patient identified by MRN, date of birth, ID band Patient awake    Reviewed: Allergy & Precautions, NPO status , Patient's Chart, lab work & pertinent test results  History of Anesthesia Complications Negative for: history of anesthetic complications  Airway Mallampati: II       Dental   Pulmonary neg sleep apnea, neg COPD,           Cardiovascular (-) hypertension(-) Past MI and (-) CHF (-) dysrhythmias      Neuro/Psych neg Seizures    GI/Hepatic Neg liver ROS, GERD  Medicated and Controlled,  Endo/Other  neg diabetes  Renal/GU negative Renal ROS     Musculoskeletal   Abdominal   Peds  Hematology  (+) anemia ,   Anesthesia Other Findings   Reproductive/Obstetrics                             Anesthesia Physical Anesthesia Plan  ASA: II  Anesthesia Plan: General   Post-op Pain Management:    Induction: Intravenous  PONV Risk Score and Plan: 3 and Propofol infusion, TIVA, Treatment may vary due to age or medical condition and Ondansetron  Airway Management Planned: Nasal Cannula  Additional Equipment:   Intra-op Plan:   Post-operative Plan:   Informed Consent: I have reviewed the patients History and Physical, chart, labs and discussed the procedure including the risks, benefits and alternatives for the proposed anesthesia with the patient or authorized representative who has indicated his/her understanding and acceptance.     Plan Discussed with:   Anesthesia Plan Comments:         Anesthesia Quick Evaluation

## 2017-09-07 ENCOUNTER — Encounter: Payer: Self-pay | Admitting: Gastroenterology

## 2017-09-17 ENCOUNTER — Telehealth: Payer: Self-pay | Admitting: Gastroenterology

## 2017-09-17 LAB — SURGICAL PATHOLOGY

## 2017-09-17 NOTE — Telephone Encounter (Signed)
Patient needs for you to call her today. She needs her colonoscopy done on Dec 27 or 28. She stated Dr. Vicente Males gave her some medication. Please ask her what that was for. Call today.

## 2017-09-18 ENCOUNTER — Other Ambulatory Visit: Payer: Self-pay

## 2017-09-18 DIAGNOSIS — F1120 Opioid dependence, uncomplicated: Secondary | ICD-10-CM | POA: Diagnosis not present

## 2017-09-18 DIAGNOSIS — G894 Chronic pain syndrome: Secondary | ICD-10-CM | POA: Diagnosis not present

## 2017-09-18 DIAGNOSIS — Z791 Long term (current) use of non-steroidal anti-inflammatories (NSAID): Secondary | ICD-10-CM

## 2017-09-18 DIAGNOSIS — M25551 Pain in right hip: Secondary | ICD-10-CM | POA: Diagnosis not present

## 2017-09-18 DIAGNOSIS — G8929 Other chronic pain: Secondary | ICD-10-CM | POA: Diagnosis not present

## 2017-09-18 DIAGNOSIS — M542 Cervicalgia: Secondary | ICD-10-CM | POA: Diagnosis not present

## 2017-09-18 DIAGNOSIS — Z79891 Long term (current) use of opiate analgesic: Secondary | ICD-10-CM | POA: Diagnosis not present

## 2017-09-18 DIAGNOSIS — M545 Low back pain: Secondary | ICD-10-CM | POA: Diagnosis not present

## 2017-09-18 DIAGNOSIS — D509 Iron deficiency anemia, unspecified: Secondary | ICD-10-CM

## 2017-09-18 NOTE — Telephone Encounter (Signed)
Returned patient's call.   Explained bowel prep. Patient to take Zofran within 1 hour prior to taking prep. Advised patient to drink smaller portions of the prep and extend the time she takes it. Advised to drink fluids and stay hydrated.   Goal is for bowels to run clear without brown color.

## 2017-09-25 ENCOUNTER — Encounter: Payer: Self-pay | Admitting: *Deleted

## 2017-09-30 ENCOUNTER — Telehealth: Payer: Self-pay | Admitting: Gastroenterology

## 2017-09-30 NOTE — Telephone Encounter (Signed)
Patient Andrea Bernard and needs to r/s her procedure for tomorrow 10/01/17 as she is sick.

## 2017-09-30 NOTE — Telephone Encounter (Signed)
Pt had to cancel her EGD and Colonoscopy because she is sick.  Will reschedule after speaking to her husband.  Thanks [employer]

## 2017-10-01 ENCOUNTER — Ambulatory Visit
Admission: RE | Admit: 2017-10-01 | Payer: BLUE CROSS/BLUE SHIELD | Source: Ambulatory Visit | Admitting: Gastroenterology

## 2017-10-01 ENCOUNTER — Encounter: Admission: RE | Payer: Self-pay | Source: Ambulatory Visit

## 2017-10-01 SURGERY — ESOPHAGOGASTRODUODENOSCOPY (EGD) WITH PROPOFOL
Anesthesia: General

## 2017-10-09 DIAGNOSIS — M542 Cervicalgia: Secondary | ICD-10-CM | POA: Diagnosis not present

## 2017-10-09 DIAGNOSIS — F1120 Opioid dependence, uncomplicated: Secondary | ICD-10-CM | POA: Diagnosis not present

## 2017-10-09 DIAGNOSIS — G894 Chronic pain syndrome: Secondary | ICD-10-CM | POA: Diagnosis not present

## 2017-10-09 DIAGNOSIS — Z79891 Long term (current) use of opiate analgesic: Secondary | ICD-10-CM | POA: Diagnosis not present

## 2017-10-09 DIAGNOSIS — G8929 Other chronic pain: Secondary | ICD-10-CM | POA: Diagnosis not present

## 2017-10-09 DIAGNOSIS — M5412 Radiculopathy, cervical region: Secondary | ICD-10-CM | POA: Diagnosis not present

## 2017-10-09 DIAGNOSIS — G890 Central pain syndrome: Secondary | ICD-10-CM | POA: Diagnosis not present

## 2017-10-13 ENCOUNTER — Telehealth: Payer: Self-pay

## 2017-10-13 NOTE — Telephone Encounter (Signed)
LVM for patient to callback for procedure reschedule.    - EGD: gastric bx showed gastropathy and fundic gland polyp   Colonoscopu - poor prep - needs to be rescheduled- check if she wants it this Friday or Thursday , if not whenever she wants - over the nxt 2 weeks

## 2017-10-21 ENCOUNTER — Encounter: Payer: Self-pay | Admitting: Nurse Practitioner

## 2017-10-21 NOTE — Progress Notes (Unsigned)
Patient requested to see Urology ASAP.Andrea KitchenMarland KitchenPatient scheduled to see [facility] 10/27/17 @ 1:15.Tat  **faxed notes, labs,****

## 2017-10-23 ENCOUNTER — Ambulatory Visit: Payer: BLUE CROSS/BLUE SHIELD | Admitting: Urology

## 2017-10-23 ENCOUNTER — Encounter: Payer: Self-pay | Admitting: Urology

## 2017-10-23 VITALS — BP 110/68 | HR 56 | Ht 64.0 in | Wt 159.7 lb

## 2017-10-23 DIAGNOSIS — R310 Gross hematuria: Secondary | ICD-10-CM

## 2017-10-23 LAB — MICROSCOPIC EXAMINATION

## 2017-10-23 LAB — URINALYSIS, COMPLETE
Bilirubin, UA: NEGATIVE
Glucose, UA: NEGATIVE
KETONES UA: NEGATIVE
NITRITE UA: NEGATIVE
PH UA: 5.5 (ref 5.0–7.5)
SPEC GRAV UA: 1.025 (ref 1.005–1.030)
Urobilinogen, Ur: 0.2 mg/dL (ref 0.2–1.0)

## 2017-10-23 NOTE — Progress Notes (Signed)
10/23/2017 1:59 PM   Andrea Bernard 06/02/1966 [id number]  Referring provider: [name], [address], [place] [address]  Chief Complaint  Patient presents with  . Hematuria    HPI: The patient is a 52 year old female who presents today with a 54-month history of gross hematuria.  The patient also has had intermittent dysuria and has been treated for positive urine cultures during this time.  Her dysuria has waxed and waned.  She currently does have some dysuria.  She has no previous history to recurrent UTIs prior to this episode.  She does not have history of nephrolithiasis.  She is concerned that intercourse is causing this.  She has had a gross hematuria in between episodes of dysuria.  Of note she does have neurofibromatosis and has had multiple neurofibromas removed from her skin.  PMH: Past Medical History:  Diagnosis Date  . Arthritis   . GERD (gastroesophageal reflux disease)   . Iron deficiency anemia 06/15/2017  . Neurofibromatosis [facility])     Surgical History: Past Surgical History:  Procedure Laterality Date  . CESAREAN SECTION     x 4  . COLONOSCOPY WITH PROPOFOL N/A 09/04/2017   Procedure: COLONOSCOPY WITH PROPOFOL;  Surgeon: [name], MD;  Location: [facility] ENDOSCOPY;  Service: Gastroenterology;  Laterality: N/A;  . ESOPHAGOGASTRODUODENOSCOPY (EGD) WITH PROPOFOL N/A 09/04/2017   Procedure: ESOPHAGOGASTRODUODENOSCOPY (EGD) WITH PROPOFOL;  Surgeon: [name], MD;  Location: [facility] ENDOSCOPY;  Service: Gastroenterology;  Laterality: N/A;  . HERNIA REPAIR    . TUBAL LIGATION    . TUMOR REMOVAL      Home Medications:  Allergies as of 10/23/2017   No Known Allergies     Medication List        Accurate as of 10/23/17  1:59 PM. Always use your most recent med list.          acetaminophen 325 MG tablet Commonly known as:  TYLENOL Take 650 mg by mouth.   cyclobenzaprine 10 MG tablet Commonly known as:  FLEXERIL Take 10 mg by mouth  daily.   famotidine 20 MG tablet Commonly known as:  PEPCID Take 20 mg by mouth daily.   gabapentin 300 MG capsule Commonly known as:  NEURONTIN Take 300 mg by mouth 3 (three) times daily.   OXYCONTIN 80 mg 12 hr tablet Generic drug:  oxyCODONE Take 80 mg by mouth every 12 (twelve) hours.   pantoprazole 40 MG tablet Commonly known as:  PROTONIX Take 40 mg by mouth daily.       Allergies: No Known Allergies  Family History: Family History  Problem Relation Age of Onset  . Anemia Mother   . Bladder Cancer Neg Hx   . Kidney cancer Neg Hx     Social History:  reports that  has never smoked. she has never used smokeless tobacco. She reports that she uses drugs. Drug: Other-see comments. She reports that she does not drink alcohol.  ROS: UROLOGY Frequent Urination?: Yes Hard to postpone urination?: Yes Burning/pain with urination?: Yes Get up at night to urinate?: No Leakage of urine?: No Urine stream starts and stops?: Yes Trouble starting stream?: No Do you have to strain to urinate?: No Blood in urine?: Yes Urinary tract infection?: Yes Sexually transmitted disease?: No Injury to kidneys or bladder?: No Painful intercourse?: Yes Weak stream?: Yes Currently pregnant?: No Vaginal bleeding?: No Last menstrual period?: n  Gastrointestinal Nausea?: Yes Vomiting?: No Indigestion/heartburn?: No Diarrhea?: No Constipation?: No  Constitutional Fever: Yes  Night sweats?: No Weight loss?: No Fatigue?: No  Skin Skin rash/lesions?: No Itching?: No  Eyes Blurred vision?: No Double vision?: No  Ears/Nose/Throat Sore throat?: Yes Sinus problems?: No  Hematologic/Lymphatic Swollen glands?: No Easy bruising?: No  Cardiovascular Leg swelling?: No Chest pain?: No  Respiratory Cough?: No Shortness of breath?: No  Endocrine Excessive thirst?: No  Musculoskeletal Back pain?: Yes Joint pain?: No  Neurological Headaches?: Yes Dizziness?:  Yes  Psychologic Depression?: No Anxiety?: No  Physical Exam: BP 110/68 (BP Location: Right Arm, Patient Position: Sitting, Cuff Size: Normal)   Pulse (!) 56   Ht 5\' 4"  (1.626 m)   Wt 159 lb 11.2 oz (72.4 kg)   BMI 27.41 kg/m   Constitutional:  Alert and oriented, No acute distress. HEENT: [place] AT, moist mucus membranes.  Trachea midline, no masses. Cardiovascular: No clubbing, cyanosis, or edema. Respiratory: Normal respiratory effort, no increased work of breathing. GI: Abdomen is soft, nontender, nondistended, no abdominal masses GU: No CVA tenderness.  Skin: No rashes, bruises or suspicious lesions. Lymph: No cervical or inguinal adenopathy. Neurologic: Grossly intact, no focal deficits, moving all 4 extremities. Psychiatric: Normal mood and affect.  Laboratory Data: Lab Results  Component Value Date   WBC 4.6 08/13/2017   HGB 13.2 08/13/2017   HCT 39.6 08/13/2017   MCV 80.2 08/13/2017   PLT 210 08/13/2017    Lab Results  Component Value Date   CREATININE 0.76 06/15/2017    No results found for: PSA  No results found for: TESTOSTERONE  No results found for: HGBA1C  Urinalysis    Component Value Date/Time   COLORURINE YELLOW (A) 08/13/2017 1201   APPEARANCEUR CLEAR (A) 08/13/2017 1201   LABSPEC 1.015 08/13/2017 1201   PHURINE 7.0 08/13/2017 1201   GLUCOSEU NEGATIVE 08/13/2017 1201   HGBUR NEGATIVE 08/13/2017 1201   BILIRUBINUR NEGATIVE 08/13/2017 1201   KETONESUR NEGATIVE 08/13/2017 1201   PROTEINUR NEGATIVE 08/13/2017 1201   NITRITE NEGATIVE 08/13/2017 [place] 08/13/2017 1201    Assessment & Plan:    1. Gross hematuria We will arrange the patient undergo CT hematuria protocol followed by office cystoscopy  2.  Dysuria Will send her urine for culture today to ensure there is no urinary tract infection.   Return for for cysto after CT.  [name], MD  [facility] [address], [place], [place] [address] [phone]

## 2017-10-25 LAB — URINE CULTURE

## 2017-10-27 ENCOUNTER — Ambulatory Visit: Payer: BLUE CROSS/BLUE SHIELD | Admitting: Nurse Practitioner

## 2017-10-27 ENCOUNTER — Encounter: Payer: Self-pay | Admitting: Nurse Practitioner

## 2017-10-27 ENCOUNTER — Ambulatory Visit: Payer: Self-pay | Admitting: Urology

## 2017-10-27 VITALS — BP 94/76 | HR 70 | Temp 97.6°F | Resp 16 | Ht 64.0 in | Wt 160.8 lb

## 2017-10-27 DIAGNOSIS — J029 Acute pharyngitis, unspecified: Secondary | ICD-10-CM | POA: Diagnosis not present

## 2017-10-27 DIAGNOSIS — R5383 Other fatigue: Secondary | ICD-10-CM | POA: Diagnosis not present

## 2017-10-27 DIAGNOSIS — D509 Iron deficiency anemia, unspecified: Secondary | ICD-10-CM

## 2017-10-27 DIAGNOSIS — R319 Hematuria, unspecified: Secondary | ICD-10-CM | POA: Diagnosis not present

## 2017-10-27 DIAGNOSIS — N390 Urinary tract infection, site not specified: Secondary | ICD-10-CM

## 2017-10-27 MED ORDER — NITROFURANTOIN MONOHYD MACRO 100 MG PO CAPS: ORAL_CAPSULE | ORAL | 2 refills | Status: DC

## 2017-10-27 MED ORDER — AMOXICILLIN 875 MG PO TABS: 875.0000 mg | ORAL_TABLET | Freq: Two times a day (BID) | ORAL | 0 refills | Status: DC

## 2017-10-27 NOTE — Progress Notes (Addendum)
[facility] [place], [place] [address]  Internal MEDICINE  Office Visit Note  Patient Name: Andrea Bernard  [id number]  [id number]  Date of Service: 10/27/2017  Chief Complaint  Patient presents with  . Sore Throat    is going on last 5 weeks   . Urinary Tract Infection    treated multiple times with different antibiotics. cannot get rid of antibiotics.     The patient has been treated multiple times for UTI. Continues to have blood in the urine and strong odor to the urine. She has seen urology last week. Is gong to be doing some testing for bladder/renal cancer. No antibiotics were prescribed. Testing was ordered.    Sore Throat   This is a recurrent problem. The current episode started more than 1 month ago. The problem has been unchanged. Neither side of throat is experiencing more pain than the other. There has been no fever. The pain is at a severity of 0/10. The patient is experiencing no pain. Pertinent negatives include no abdominal pain, congestion, coughing, diarrhea, neck pain, shortness of breath or vomiting. She has tried nothing for the symptoms. The treatment provided no relief.    Pt is here for routine follow up.    Current Medication: Outpatient Encounter Medications as of 10/27/2017  Medication Sig  . acetaminophen (TYLENOL) 325 MG tablet Take 650 mg by mouth.  . cyclobenzaprine (FLEXERIL) 10 MG tablet Take 10 mg by mouth daily.  . famotidine (PEPCID) 20 MG tablet Take 20 mg by mouth daily.  [name] gabapentin (NEURONTIN) 300 MG capsule Take 300 mg by mouth 3 (three) times daily.  . OXYCONTIN 80 MG 12 hr tablet Take 80 mg by mouth every 12 (twelve) hours.  . pantoprazole (PROTONIX) 40 MG tablet Take 40 mg by mouth daily.   Facility-Administered Encounter Medications as of 10/27/2017  Medication  . iron sucrose (VENOFER) 200 mg IVPB  . sodium chloride flush (NS) 0.9 % injection 10 mL    Surgical History: Past Surgical History:    Procedure Laterality Date  . CESAREAN SECTION     x 4  . COLONOSCOPY WITH PROPOFOL N/A 09/04/2017   Procedure: COLONOSCOPY WITH PROPOFOL;  Surgeon: [name], MD;  Location: [facility] ENDOSCOPY;  Service: Gastroenterology;  Laterality: N/A;  . ESOPHAGOGASTRODUODENOSCOPY (EGD) WITH PROPOFOL N/A 09/04/2017   Procedure: ESOPHAGOGASTRODUODENOSCOPY (EGD) WITH PROPOFOL;  Surgeon: [name], MD;  Location: [facility] ENDOSCOPY;  Service: Gastroenterology;  Laterality: N/A;  . HERNIA REPAIR    . TUBAL LIGATION    . TUMOR REMOVAL      Medical History: Past Medical History:  Diagnosis Date  . Arthritis   . GERD (gastroesophageal reflux disease)   . Iron deficiency anemia 06/15/2017  . Neurofibromatosis ([place])     Family History: Family History  Problem Relation Age of Onset  . Anemia Mother   . Bladder Cancer Neg Hx   . Kidney cancer Neg Hx     Social History   Socioeconomic History  . Marital status: Married    Spouse name: Not on file  . Number of children: Not on file  . Years of education: Not on file  . Highest education level: Not on file  Social Needs  . Financial resource strain: Not on file  . Food insecurity - worry: Not on file  . Food insecurity - inability: Not on file  . Transportation needs - medical: Not on file  . Transportation needs - non-medical: Not on file  Occupational History  . Not on file  Tobacco Use  . Smoking status: Never Smoker  . Smokeless tobacco: Never Used  Substance and Sexual Activity  . Alcohol use: No  . Drug use: Yes    Types: Other-see comments    Comment: Oxycotin  . Sexual activity: Yes    Birth control/protection: Surgical  Other Topics Concern  . Not on file  Social History Narrative  . Not on file      Review of Systems  Constitutional: Positive for fatigue. Negative for chills and unexpected weight change.  HENT: Positive for postnasal drip, sore throat and voice change. Negative for congestion, rhinorrhea and sneezing.    Eyes: Negative.  Negative for redness.  Respiratory: Negative for cough, chest tightness, shortness of breath and wheezing.   Cardiovascular: Negative for chest pain and palpitations.  Gastrointestinal: Negative for abdominal pain, constipation, diarrhea, nausea and vomiting.  Endocrine: Negative.   Genitourinary: Positive for frequency and hematuria. Negative for dysuria.  Musculoskeletal: Positive for arthralgias and back pain. Negative for joint swelling and neck pain.       Treated per pain management   Skin: Negative.  Negative for rash.  Allergic/Immunologic: Positive for environmental allergies.  Neurological: Negative.  Negative for tremors and numbness.  Hematological: Negative for adenopathy. Does not bruise/bleed easily.       History of significant anemia. Over the summer, was treated with 5 iron infusions.   Psychiatric/Behavioral: Negative.  Negative for behavioral problems (Depression), sleep disturbance and suicidal ideas. The patient is not nervous/anxious.     Today's Vitals   10/27/17 0833  BP: 94/76  Pulse: 70  Resp: 16  Temp: 97.6 F (36.4 C)  SpO2: 97%  Weight: 160 lb 12.8 oz (72.9 kg)  Height: 5\' 4"  (1.626 m)    Physical Exam  Constitutional: She is oriented to person, place, and time. She appears well-developed and well-nourished. No distress.  HENT:  Head: Normocephalic and atraumatic.  Nose: Right sinus exhibits frontal sinus tenderness. Left sinus exhibits frontal sinus tenderness.  Mouth/Throat: Posterior oropharyngeal erythema present. No oropharyngeal exudate.    Eyes: EOM are normal. Pupils are equal, round, and reactive to light.  Neck: Normal range of motion. Neck supple. No JVD present. No tracheal deviation present. No thyromegaly present.  Cardiovascular: Normal rate, regular rhythm and normal heart sounds. Exam reveals no gallop and no friction rub.  No murmur heard. Pulmonary/Chest: Effort normal and breath sounds normal. No respiratory  distress. She has no wheezes. She has no rales.  Abdominal: Soft. Bowel sounds are normal. There is no tenderness.  Genitourinary:  Genitourinary Comments: Urine sample defered today  Musculoskeletal: Normal range of motion.  Lymphadenopathy:    She has cervical adenopathy.  Neurological: She is alert and oriented to person, place, and time. No cranial nerve deficit.  Skin: Skin is warm and dry. She is not diaphoretic.  Psychiatric: She has a normal mood and affect. Her behavior is normal. Judgment and thought content normal.  Nursing note and vitals reviewed.    Assessment/Plan:  1. Pharyngitis, unspecified etiology - amoxicillin (AMOXIL) 875 MG tablet; Take 1 tablet (875 mg total) by mouth 2 (two) times daily.  Dispense: 20 tablet; Refill: 0 Gargle with warm salt water as needed for sore throat.   2. Urinary tract infection with hematuria, site unspecified - nitrofurantoin, macrocrystal-monohydrate, (MACROBID) 100 MG capsule; Take 1 capsule po Bid for 10 days. Then take 1 capsule po qd.  Dispense: 40 capsule; Refill: 2 Suggested  the patient keep testing and follow up appointments with urology.   3. Fatigue, unspecified type - CBC with Differential/Platelet - Comprehensive metabolic panel - Vitamin J50 - TSH - T4, free  4. Iron deficiency anemia, unspecified iron deficiency anemia type - Ferritin - Vitamin B12 Will refer back to hematology as scheduled  She should follow up in one month to review al testing.   General Counseling: nkenge sonntag understanding of the findings of todays visit and agrees with plan of treatment. I have discussed any further diagnostic evaluation that may be needed or ordered today. We also reviewed her medications today. she has been encouraged to call the office with any questions or concerns that should arise related to todays visit.   This patient was seen by Leretha Pol, FNP- C in Collaboration with Dr [name] as a part of  collaborative care agreement   Time spent:  61 Minutes      Dr [name] Internal medicine

## 2017-10-29 DIAGNOSIS — R5383 Other fatigue: Secondary | ICD-10-CM | POA: Diagnosis not present

## 2017-10-29 DIAGNOSIS — D509 Iron deficiency anemia, unspecified: Secondary | ICD-10-CM | POA: Diagnosis not present

## 2017-10-30 LAB — CBC WITH DIFFERENTIAL/PLATELET
Basophils Absolute: 0 10*3/uL (ref 0.0–0.2)
Basos: 0 %
EOS (ABSOLUTE): 0.6 10*3/uL — ABNORMAL HIGH (ref 0.0–0.4)
EOS: 5 %
HEMATOCRIT: 36.7 % (ref 34.0–46.6)
Hemoglobin: 12.2 g/dL (ref 11.1–15.9)
Immature Grans (Abs): 0 10*3/uL (ref 0.0–0.1)
Immature Granulocytes: 0 %
LYMPHS ABS: 1.3 10*3/uL (ref 0.7–3.1)
Lymphs: 13 %
MCH: 28.4 pg (ref 26.6–33.0)
MCHC: 33.2 g/dL (ref 31.5–35.7)
MCV: 86 fL (ref 79–97)
MONOS ABS: 0.6 10*3/uL (ref 0.1–0.9)
Monocytes: 6 %
NEUTROS ABS: 7.7 10*3/uL — AB (ref 1.4–7.0)
Neutrophils: 76 %
Platelets: 226 10*3/uL (ref 150–379)
RBC: 4.29 x10E6/uL (ref 3.77–5.28)
RDW: 13.2 % (ref 12.3–15.4)
WBC: 10.1 10*3/uL (ref 3.4–10.8)

## 2017-10-30 LAB — COMPREHENSIVE METABOLIC PANEL
ALT: 18 IU/L (ref 0–32)
AST: 21 IU/L (ref 0–40)
Albumin/Globulin Ratio: 1.8 (ref 1.2–2.2)
Albumin: 4.2 g/dL (ref 3.5–5.5)
Alkaline Phosphatase: 60 IU/L (ref 39–117)
BILIRUBIN TOTAL: 0.4 mg/dL (ref 0.0–1.2)
BUN/Creatinine Ratio: 14 (ref 9–23)
BUN: 12 mg/dL (ref 6–24)
CHLORIDE: 100 mmol/L (ref 96–106)
CO2: 27 mmol/L (ref 20–29)
Calcium: 9 mg/dL (ref 8.7–10.2)
Creatinine, Ser: 0.85 mg/dL (ref 0.57–1.00)
GFR calc Af Amer: 92 mL/min/{1.73_m2} (ref >59)
GFR calc non Af Amer: 80 mL/min/{1.73_m2} (ref >59)
GLOBULIN, TOTAL: 2.4 g/dL (ref 1.5–4.5)
Glucose: 106 mg/dL — ABNORMAL HIGH (ref 65–99)
POTASSIUM: 4.3 mmol/L (ref 3.5–5.2)
SODIUM: 139 mmol/L (ref 134–144)
Total Protein: 6.6 g/dL (ref 6.0–8.5)

## 2017-10-30 LAB — T4, FREE: Free T4: 1.15 ng/dL (ref 0.82–1.77)

## 2017-10-30 LAB — TSH: TSH: 0.512 u[IU]/mL (ref 0.450–4.500)

## 2017-10-30 LAB — FERRITIN: Ferritin: 89 ng/mL (ref 15–150)

## 2017-10-30 LAB — VITAMIN B12: VITAMIN B 12: 730 pg/mL (ref 232–1245)

## 2017-11-04 ENCOUNTER — Ambulatory Visit
Admission: RE | Admit: 2017-11-04 | Discharge: 2017-11-04 | Disposition: A | Discharge status: Home / Self Care | Payer: BLUE CROSS/BLUE SHIELD | Source: Ambulatory Visit | Attending: Urology | Admitting: Urology

## 2017-11-04 DIAGNOSIS — N289 Disorder of kidney and ureter, unspecified: Secondary | ICD-10-CM | POA: Diagnosis not present

## 2017-11-04 DIAGNOSIS — R310 Gross hematuria: Secondary | ICD-10-CM | POA: Diagnosis not present

## 2017-11-04 DIAGNOSIS — N281 Cyst of kidney, acquired: Secondary | ICD-10-CM | POA: Insufficient documentation

## 2017-11-04 MED ORDER — IOPAMIDOL (ISOVUE-300) INJECTION 61%
125.0000 mL | Freq: Once | INTRAVENOUS | Status: AC | PRN
  Administered 2017-11-04: 125 mL via INTRAVENOUS

## 2017-11-06 ENCOUNTER — Ambulatory Visit (INDEPENDENT_AMBULATORY_CARE_PROVIDER_SITE_OTHER): Payer: BLUE CROSS/BLUE SHIELD | Admitting: Urology

## 2017-11-06 ENCOUNTER — Encounter: Payer: Self-pay | Admitting: Urology

## 2017-11-06 VITALS — BP 115/71 | HR 61 | Ht 64.0 in | Wt 159.7 lb

## 2017-11-06 DIAGNOSIS — N281 Cyst of kidney, acquired: Secondary | ICD-10-CM

## 2017-11-06 DIAGNOSIS — R310 Gross hematuria: Secondary | ICD-10-CM | POA: Diagnosis not present

## 2017-11-06 LAB — URINALYSIS, COMPLETE
Bilirubin, UA: NEGATIVE
Glucose, UA: NEGATIVE
Ketones, UA: NEGATIVE
LEUKOCYTES UA: NEGATIVE
Nitrite, UA: NEGATIVE
PH UA: 6 (ref 5.0–7.5)
PROTEIN UA: NEGATIVE
Specific Gravity, UA: 1.02 (ref 1.005–1.030)
UUROB: 1 mg/dL (ref 0.2–1.0)

## 2017-11-06 LAB — MICROSCOPIC EXAMINATION: WBC UA: NONE SEEN /HPF (ref 0–?)

## 2017-11-06 MED ORDER — LIDOCAINE HCL 2 % EX GEL
1.0000 "application " | Freq: Once | CUTANEOUS | Status: AC
  Administered 2017-11-06: 1 via URETHRAL

## 2017-11-06 MED ORDER — CIPROFLOXACIN HCL 500 MG PO TABS
500.0000 mg | ORAL_TABLET | Freq: Once | ORAL | Status: AC
  Administered 2017-11-06: 500 mg via ORAL

## 2017-11-06 NOTE — Progress Notes (Signed)
   11/06/17  CC:  Chief Complaint  Patient presents with  . Cysto    HPI: The patient is a 52 year old female who presents today with a 18-month history of gross hematuria.  The patient also has had intermittent dysuria and has been treated for positive urine cultures during this time.  Her dysuria has waxed and waned.  She currently does have some dysuria.  She has no previous history to recurrent UTIs prior to this episode.  She does not have history of nephrolithiasis.  She is concerned that intercourse is causing this.  She has had a gross hematuria in between episodes of dysuria.  Of note she does have neurofibromatosis and has had multiple neurofibromas removed from her skin.  CT hematuria revealed a 10 mm low-density lesion in kidney that may enhance slightly after IV contrast.  The radiologist did recommend an MRI of the abdomen.  Urinalysis today is clear with no sign of infection or hematuria. Was treated for UTI since last seen.   Last menstrual period 10/20/2017. NED. A&Ox3.   No respiratory distress   Abd soft, NT, ND Normal external genitalia with patent urethral meatus  Cystoscopy Procedure Note  Patient identification was confirmed, informed consent was obtained, and patient was prepped using Betadine solution.  Lidocaine jelly was administered per urethral meatus.    Preoperative abx where received prior to procedure.    Procedure: - Flexible cystoscope introduced, without any difficulty.   - Thorough search of the bladder revealed:    normal urethral meatus    normal urothelium    no stones    no ulcers     no tumors    no urethral polyps    no trabeculation  - Ureteral orifices were normal in position and appearance.  Post-Procedure: - Patient tolerated the procedure well  Assessment/ Plan:  1. Gross hematuria -Negative work up except for below  2.  10 mm low density lesion in the interpolar left kidney with slight enhancement I discussed options  with the patient including renal ultrasound versus MRI in the next few months to monitor for any growth.  She has elected to follow-up with a renal ultrasound in 3 months.  This is very reasonable given the very small size of the cyst and only minimal uptake.  [name], MD

## 2017-11-11 ENCOUNTER — Other Ambulatory Visit: Payer: Self-pay | Admitting: Internal Medicine

## 2017-11-11 DIAGNOSIS — Z1231 Encounter for screening mammogram for malignant neoplasm of breast: Secondary | ICD-10-CM

## 2017-11-18 ENCOUNTER — Telehealth: Payer: Self-pay

## 2017-11-18 NOTE — Telephone Encounter (Signed)
Tried to call pt back but couldn't get through will try again later.  dbs

## 2017-11-18 NOTE — Telephone Encounter (Signed)
-----   Message from Ronnell Freshwater, NP sent at 11/17/2017  1:06 PM EST ----- Regarding: RE: pt very dizzy Contact: [phone] Please let her know her labs were great. There are a few GI viruses going around which might cause these symptoms. I recommend rest and increased fluid. Maybe bland foods. Advance diet as tolerated.   ----- Message ----- From: Edd Arbour, CMA Sent: 11/16/2017   5:11 PM To: Ronnell Freshwater, NP Subject: pt very dizzy                                  Pt called advising that she became very dizzy this morning for 3 hours and got sick and vomited once and later it eased off and is wondering if her labs showed anything that could cause that or what she needs to do.  She does feel some better.  dbs

## 2017-11-24 ENCOUNTER — Telehealth: Payer: Self-pay | Admitting: Gastroenterology

## 2017-11-24 NOTE — Telephone Encounter (Signed)
Patient called & l/m stating she was to have an upper endoscopy & colonoscopy in December 2018.She had the upper but was unable to have the colonoscopy.She is calling back to set that up & would like 12-04-17.

## 2017-11-24 NOTE — Telephone Encounter (Signed)
Please see previous telephone note

## 2017-11-25 ENCOUNTER — Telehealth: Payer: Self-pay

## 2017-11-25 NOTE — Telephone Encounter (Signed)
LVM returning patients call to schedule her repeat colonoscopy.

## 2017-11-25 NOTE — Telephone Encounter (Signed)
Returned patient's call.   LVM for callback with direct line.

## 2017-11-26 ENCOUNTER — Other Ambulatory Visit: Payer: Self-pay

## 2017-11-26 DIAGNOSIS — D509 Iron deficiency anemia, unspecified: Secondary | ICD-10-CM

## 2017-11-27 ENCOUNTER — Ambulatory Visit
Admission: RE | Admit: 2017-11-27 | Discharge: 2017-11-27 | Disposition: A | Discharge status: Home / Self Care | Payer: BLUE CROSS/BLUE SHIELD | Source: Ambulatory Visit | Attending: Internal Medicine | Admitting: Internal Medicine

## 2017-11-27 ENCOUNTER — Encounter: Payer: Self-pay | Admitting: Radiology

## 2017-11-27 DIAGNOSIS — Z1231 Encounter for screening mammogram for malignant neoplasm of breast: Secondary | ICD-10-CM | POA: Insufficient documentation

## 2017-11-30 ENCOUNTER — Telehealth: Payer: Self-pay

## 2017-11-30 NOTE — Telephone Encounter (Signed)
LVM x 3 for patient callback to schedule.

## 2017-12-07 ENCOUNTER — Other Ambulatory Visit: Payer: Self-pay | Admitting: Internal Medicine

## 2017-12-08 ENCOUNTER — Ambulatory Visit (INDEPENDENT_AMBULATORY_CARE_PROVIDER_SITE_OTHER): Payer: BLUE CROSS/BLUE SHIELD | Admitting: Nurse Practitioner

## 2017-12-08 ENCOUNTER — Encounter: Payer: Self-pay | Admitting: Nurse Practitioner

## 2017-12-08 VITALS — BP 110/70 | HR 58 | Resp 16 | Ht 64.0 in | Wt 160.2 lb

## 2017-12-08 DIAGNOSIS — R300 Dysuria: Secondary | ICD-10-CM

## 2017-12-08 DIAGNOSIS — Z0001 Encounter for general adult medical examination with abnormal findings: Secondary | ICD-10-CM

## 2017-12-08 DIAGNOSIS — N920 Excessive and frequent menstruation with regular cycle: Secondary | ICD-10-CM | POA: Diagnosis not present

## 2017-12-08 DIAGNOSIS — R635 Abnormal weight gain: Secondary | ICD-10-CM

## 2017-12-08 MED ORDER — PHENTERMINE HCL 37.5 MG PO TABS: 37.5000 mg | ORAL_TABLET | Freq: Every day | ORAL | 1 refills | Status: DC

## 2017-12-08 NOTE — Progress Notes (Signed)
Patient wants to hold off scheduling 3 month follow up. Andrea Bernard

## 2017-12-08 NOTE — Progress Notes (Signed)
[facility] [place], [place] [address]  Internal MEDICINE  Office Visit Note  Patient Name: Andrea Bernard  [id number]  [id number]  Date of Service: 12/27/2017  No chief complaint on file.    The patient is here for routine health maintenance exam. Today, she states that she is having trouble with weight loss over the past few months. States that during the wintter, she typically gains a few pounds, but then, needs help with obtaining her goal weight. She is limiting her calorie intake to 1200 calories per day. She exercises nearly daily. Weight has been stable since her last visit in 10/2017.   Pt is here for routine health maintenance examination  Current Medication: Outpatient Encounter Medications as of 12/08/2017  Medication Sig  . acetaminophen (TYLENOL) 325 MG tablet Take 650 mg by mouth.  . cyclobenzaprine (FLEXERIL) 10 MG tablet Take 10 mg by mouth daily.  . famotidine (PEPCID) 20 MG tablet Take 20 mg by mouth daily.  [name] gabapentin (NEURONTIN) 300 MG capsule Take 300 mg by mouth 3 (three) times daily.  . OXYCONTIN 80 MG 12 hr tablet Take 80 mg by mouth every 12 (twelve) hours.  . pantoprazole (PROTONIX) 40 MG tablet TAKE 1 TABLET BY MOUTH TWICE A DAY  . phentermine (ADIPEX-P) 37.5 MG tablet Take 1 tablet (37.5 mg total) by mouth daily before breakfast. (Patient not taking: Reported on 12/21/2017)   Facility-Administered Encounter Medications as of 12/08/2017  Medication  . iron sucrose (VENOFER) 200 mg IVPB  . sodium chloride flush (NS) 0.9 % injection 10 mL    Surgical History: Past Surgical History:  Procedure Laterality Date  . carpel tunnel release    . CESAREAN SECTION     x 4  . COLONOSCOPY WITH PROPOFOL N/A 09/04/2017   Procedure: COLONOSCOPY WITH PROPOFOL;  Surgeon: [name], MD;  Location: [facility] ENDOSCOPY;  Service: Gastroenterology;  Laterality: N/A;  . COLONOSCOPY WITH PROPOFOL N/A 12/21/2017   Procedure: COLONOSCOPY WITH  PROPOFOL;  Surgeon: [name], MD;  Location: [facility] ENDOSCOPY;  Service: Gastroenterology;  Laterality: N/A;  . ESOPHAGOGASTRODUODENOSCOPY (EGD) WITH PROPOFOL N/A 09/04/2017   Procedure: ESOPHAGOGASTRODUODENOSCOPY (EGD) WITH PROPOFOL;  Surgeon: [name], MD;  Location: [facility] ENDOSCOPY;  Service: Gastroenterology;  Laterality: N/A;  . HERNIA REPAIR    . TUBAL LIGATION    . TUMOR REMOVAL      Medical History: Past Medical History:  Diagnosis Date  . Arthritis   . GERD (gastroesophageal reflux disease)   . Headache   . Iron deficiency anemia 06/15/2017  . Neurofibromatosis ([place])     Family History: Family History  Problem Relation Age of Onset  . Anemia Mother   . Bladder Cancer Neg Hx   . Kidney cancer Neg Hx   . Breast cancer Neg Hx       Review of Systems  Constitutional: Negative for activity change, chills, fatigue and unexpected weight change.  HENT: Negative for congestion, postnasal drip, rhinorrhea, sneezing, sore throat and voice change.   Eyes: Negative.  Negative for redness.  Respiratory: Negative for apnea, cough, chest tightness, shortness of breath and wheezing.   Cardiovascular: Negative for chest pain and palpitations.  Gastrointestinal: Negative for abdominal pain, constipation, diarrhea, nausea and vomiting.  Endocrine: Negative for cold intolerance, heat intolerance, polydipsia, polyphagia and polyuria.  Genitourinary: Negative for difficulty urinating, dysuria, frequency and hematuria.  Musculoskeletal: Positive for arthralgias and back pain. Negative for joint swelling and neck pain.  Treated per pain management   Skin: Negative.  Negative for rash.  Allergic/Immunologic: Positive for environmental allergies.  Neurological: Positive for headaches. Negative for tremors and numbness.  Hematological: Negative for adenopathy. Does not bruise/bleed easily.       History of significant anemia. Over the summer, was treated with 5 iron infusions.     Psychiatric/Behavioral: Negative for behavioral problems (Depression), dysphoric mood, sleep disturbance and suicidal ideas. The patient is not nervous/anxious.      Vitals:   12/08/17 1630  BP: 110/70  Pulse: (!) 58  Resp: 16  SpO2: 99%    Physical Exam  Constitutional: She is oriented to person, place, and time. She appears well-developed and well-nourished. No distress.  HENT:  Head: Normocephalic and atraumatic.  Nose: Right sinus exhibits no frontal sinus tenderness. Left sinus exhibits no frontal sinus tenderness.  Mouth/Throat: No oropharyngeal exudate or posterior oropharyngeal erythema.    Eyes: Pupils are equal, round, and reactive to light. EOM are normal.  Neck: Normal range of motion. Neck supple. No JVD present. No tracheal deviation present. No thyromegaly present.  Cardiovascular: Normal rate, regular rhythm, normal heart sounds and intact distal pulses. Exam reveals no gallop and no friction rub.  No murmur heard. Pulmonary/Chest: Effort normal and breath sounds normal. No respiratory distress. She has no wheezes. She has no rales. Right breast exhibits no inverted nipple, no mass, no nipple discharge, no skin change and no tenderness. Left breast exhibits no inverted nipple, no mass, no nipple discharge, no skin change and no tenderness. Breasts are symmetrical.  There are multiple fibromas in bilateral breasts. They are stable and nontender.   Abdominal: Soft. Bowel sounds are normal. There is no tenderness.  Genitourinary:  Genitourinary Comments: Urine sample defered today  Musculoskeletal: Normal range of motion.  Lymphadenopathy:    She has no cervical adenopathy.  Neurological: She is alert and oriented to person, place, and time. No cranial nerve deficit.  Skin: Skin is warm and dry. She is not diaphoretic.  Psychiatric: She has a normal mood and affect. Her behavior is normal. Judgment and thought content normal.  Nursing note and vitals  reviewed.    LABS: Recent Results (from the past 2160 hour(s))  Urinalysis, Complete     Status: Abnormal   Collection Time: 10/23/17  1:20 PM  Result Value Ref Range   Specific Gravity, UA 1.025 1.005 - 1.030   pH, UA 5.5 5.0 - 7.5   Color, UA Yellow Yellow   Appearance Ur Cloudy (A) Clear   Leukocytes, UA 2+ (A) Negative   Protein, UA 1+ (A) Negative/Trace   Glucose, UA Negative Negative   Ketones, UA Negative Negative   RBC, UA 2+ (A) Negative   Bilirubin, UA Negative Negative   Urobilinogen, Ur 0.2 0.2 - 1.0 mg/dL   Nitrite, UA Negative Negative   Microscopic Examination See below:   Microscopic Examination     Status: Abnormal   Collection Time: 10/23/17  1:20 PM  Result Value Ref Range   WBC, UA 11-30 (A) 0 - 5 /hpf   RBC, UA 3-10 (A) 0 - 2 /hpf   Epithelial Cells (non renal) 0-10 0 - 10 /hpf   Mucus, UA Present (A) Not Estab.   Bacteria, UA Many (A) None seen/Few  Urine culture     Status: Abnormal   Collection Time: 10/23/17  1:59 PM  Result Value Ref Range   Urine Culture, Routine Final report (A)    Organism ID, Bacteria Escherichia  coli (A)     Comment: Greater than 100,000 colony forming units per mL   Antimicrobial Susceptibility Comment     Comment:       ** S = Susceptible; I = Intermediate; R = Resistant **                    P = Positive; N = Negative             MICS are expressed in micrograms per mL    Antibiotic                 RSLT#1    RSLT#2    RSLT#3    RSLT#4 Amoxicillin/Clavulanic Acid    S Ampicillin                     R Cefepime                       S Ceftriaxone                    S Cefuroxime                     I Ciprofloxacin                  R Ertapenem                      S Gentamicin                     S Imipenem                       S Levofloxacin                   R Meropenem                      S Nitrofurantoin                 S Piperacillin/Tazobactam        S Tetracycline                   R Tobramycin                      S Trimethoprim/Sulfa             R   CBC with Differential/Platelet     Status: Abnormal   Collection Time: 10/29/17  4:24 PM  Result Value Ref Range   WBC 10.1 3.4 - 10.8 x10E3/uL   RBC 4.29 3.77 - 5.28 x10E6/uL   Hemoglobin 12.2 11.1 - 15.9 g/dL   Hematocrit 36.7 34.0 - 46.6 %   MCV 86 79 - 97 fL   MCH 28.4 26.6 - 33.0 pg   MCHC 33.2 31.5 - 35.7 g/dL   RDW 13.2 12.3 - 15.4 %   Platelets 226 150 - 379 x10E3/uL   Neutrophils 76 Not Estab. %   Lymphs 13 Not Estab. %   Monocytes 6 Not Estab. %   Eos 5 Not Estab. %   Basos 0 Not Estab. %   Neutrophils Absolute 7.7 (H) 1.4 - 7.0 x10E3/uL   Lymphocytes Absolute 1.3 0.7 - 3.1 x10E3/uL   Monocytes Absolute 0.6 0.1 - 0.9 x10E3/uL   EOS (ABSOLUTE) 0.6 (H) 0.0 - 0.4 x10E3/uL  Basophils Absolute 0.0 0.0 - 0.2 x10E3/uL   Immature Granulocytes 0 Not Estab. %   Immature Grans (Abs) 0.0 0.0 - 0.1 x10E3/uL  Comprehensive metabolic panel     Status: Abnormal   Collection Time: 10/29/17  4:24 PM  Result Value Ref Range   Glucose 106 (H) 65 - 99 mg/dL   BUN 12 6 - 24 mg/dL   Creatinine, Ser 0.85 0.57 - 1.00 mg/dL   GFR calc non Af Amer 80 >59 mL/min/1.73   GFR calc Af Amer 92 >59 mL/min/1.73   BUN/Creatinine Ratio 14 9 - 23   Sodium 139 134 - 144 mmol/L   Potassium 4.3 3.5 - 5.2 mmol/L   Chloride 100 96 - 106 mmol/L   CO2 27 20 - 29 mmol/L   Calcium 9.0 8.7 - 10.2 mg/dL   Total Protein 6.6 6.0 - 8.5 g/dL   Albumin 4.2 3.5 - 5.5 g/dL   Globulin, Total 2.4 1.5 - 4.5 g/dL   Albumin/Globulin Ratio 1.8 1.2 - 2.2   Bilirubin Total 0.4 0.0 - 1.2 mg/dL   Alkaline Phosphatase 60 39 - 117 IU/L   AST 21 0 - 40 IU/L   ALT 18 0 - 32 IU/L  Ferritin     Status: None   Collection Time: 10/29/17  4:24 PM  Result Value Ref Range   Ferritin 89 15 - 150 ng/mL  Vitamin B12     Status: None   Collection Time: 10/29/17  4:24 PM  Result Value Ref Range   Vitamin B-12 730 232 - 1,245 pg/mL  TSH     Status: None   Collection Time: 10/29/17  4:24  PM  Result Value Ref Range   TSH 0.512 0.450 - 4.500 uIU/mL  T4, free     Status: None   Collection Time: 10/29/17  4:24 PM  Result Value Ref Range   Free T4 1.15 0.82 - 1.77 ng/dL  Urinalysis, Complete     Status: Abnormal   Collection Time: 11/06/17  3:50 PM  Result Value Ref Range   Specific Gravity, UA 1.020 1.005 - 1.030   pH, UA 6.0 5.0 - 7.5   Color, UA Yellow Yellow   Appearance Ur Clear Clear   Leukocytes, UA Negative Negative   Protein, UA Negative Negative/Trace   Glucose, UA Negative Negative   Ketones, UA Negative Negative   RBC, UA Trace (A) Negative   Bilirubin, UA Negative Negative   Urobilinogen, Ur 1.0 0.2 - 1.0 mg/dL   Nitrite, UA Negative Negative   Microscopic Examination See below:   Microscopic Examination     Status: Abnormal   Collection Time: 11/06/17  3:50 PM  Result Value Ref Range   WBC, UA None seen 0 - 5 /hpf   RBC, UA 0-2 0 - 2 /hpf   Epithelial Cells (non renal) 0-10 0 - 10 /hpf   Mucus, UA Present (A) Not Estab.   Bacteria, UA Few (A) None seen/Few  Urinalysis, Routine w reflex microscopic     Status: None   Collection Time: 12/08/17  4:25 PM  Result Value Ref Range   Specific Gravity, UA 1.028 1.005 - 1.030   pH, UA 5.5 5.0 - 7.5   Color, UA Yellow Yellow   Appearance Ur Clear Clear   Leukocytes, UA Negative Negative   Protein, UA Negative Negative/Trace   Glucose, UA Negative Negative   Ketones, UA Negative Negative   RBC, UA Negative Negative   Bilirubin, UA Negative Negative  Urobilinogen, Ur 0.2 0.2 - 1.0 mg/dL   Nitrite, UA Negative Negative   Microscopic Examination Comment     Comment: Microscopic not indicated and not performed.  Pregnancy, urine POC     Status: None   Collection Time: 12/21/17  8:54 AM  Result Value Ref Range   Preg Test, Ur NEGATIVE NEGATIVE    Comment:        THE SENSITIVITY OF THIS METHODOLOGY IS >24 mIU/mL    Assessment/Plan: 1. Encounter for general adult medical examination with abnormal  findings Annual health maintenance exam today.   2. Abnormal weight gain Continue to limit calorie intake to 1200 calories per day. Continue with regular physical activity.  - phentermine (ADIPEX-P) 37.5 MG tablet; Take 1 tablet (37.5 mg total) by mouth daily before breakfast. (Patient not taking: Reported on 12/21/2017)  Dispense: 30 tablet; Refill: 1  3. Menorrhagia with regular cycle - US Pelvis Complete; Future  4. Dysuria - Urinalysis, Routine w reflex microscopic  General Counseling: Andrea Bernard verbalizes understanding of the findings of todays visit and agrees with plan of treatment. I have discussed any further diagnostic evaluation that may be needed or ordered today. We also reviewed her medications today. she has been encouraged to call the office with any questions or concerns that should arise related to todays visit.   There is a liability release in patients' chart. There has been a 10 minute discussion about the side effects including but not limited to elevated blood pressure, anxiety, lack of sleep and dry mouth. Pt understands and will like to start/continue on appetite suppressant at this time. There will be one month RX given at the time of visit with proper follow up. Nova diet plan with restricted calories is given to the pt. Pt understands and agrees with  plan of treatment  This patient was seen by Leretha Pol, FNP- C in Collaboration with Dr [name] as a part of collaborative care agreement   Orders Placed This Encounter  Procedures  . US Pelvis Complete  . Urinalysis, Routine w reflex microscopic    Meds ordered this encounter  Medications  . phentermine (ADIPEX-P) 37.5 MG tablet    Sig: Take 1 tablet (37.5 mg total) by mouth daily before breakfast.    Dispense:  30 tablet    Refill:  1    Order Specific Question:   Supervising Provider    Answer:   [name] [[id number]]    Time spent: [place], MD  Internal Medicine

## 2017-12-09 ENCOUNTER — Telehealth: Payer: Self-pay

## 2017-12-09 LAB — URINALYSIS, ROUTINE W REFLEX MICROSCOPIC
Bilirubin, UA: NEGATIVE
Glucose, UA: NEGATIVE
Ketones, UA: NEGATIVE
Leukocytes, UA: NEGATIVE
NITRITE UA: NEGATIVE
PH UA: 5.5 (ref 5.0–7.5)
Protein, UA: NEGATIVE
RBC, UA: NEGATIVE
Specific Gravity, UA: 1.028 (ref 1.005–1.030)
UUROB: 0.2 mg/dL (ref 0.2–1.0)

## 2017-12-09 NOTE — Telephone Encounter (Signed)
PT HAS BEEN ADVISED TO CONTACT HER INSURANCE PLAN FOR IN NETWORK PROVIDERS FOR PAIN MANAGEMENT/BR

## 2017-12-11 DIAGNOSIS — K439 Ventral hernia without obstruction or gangrene: Secondary | ICD-10-CM | POA: Diagnosis not present

## 2017-12-14 ENCOUNTER — Ambulatory Visit: Payer: BLUE CROSS/BLUE SHIELD | Admitting: Gastroenterology

## 2017-12-21 ENCOUNTER — Ambulatory Visit: Payer: BLUE CROSS/BLUE SHIELD | Admitting: Anesthesiology

## 2017-12-21 ENCOUNTER — Encounter
Admission: RE | Disposition: A | Discharge status: Home / Self Care | Payer: Self-pay | Source: Ambulatory Visit | Attending: Gastroenterology

## 2017-12-21 ENCOUNTER — Ambulatory Visit
Admission: RE | Admit: 2017-12-21 | Discharge: 2017-12-21 | Disposition: A | Discharge status: Home / Self Care | Payer: BLUE CROSS/BLUE SHIELD | Source: Ambulatory Visit | Attending: Gastroenterology | Admitting: Gastroenterology

## 2017-12-21 DIAGNOSIS — Z79891 Long term (current) use of opiate analgesic: Secondary | ICD-10-CM | POA: Insufficient documentation

## 2017-12-21 DIAGNOSIS — K219 Gastro-esophageal reflux disease without esophagitis: Secondary | ICD-10-CM | POA: Diagnosis not present

## 2017-12-21 DIAGNOSIS — D509 Iron deficiency anemia, unspecified: Secondary | ICD-10-CM | POA: Diagnosis not present

## 2017-12-21 DIAGNOSIS — Z79899 Other long term (current) drug therapy: Secondary | ICD-10-CM | POA: Insufficient documentation

## 2017-12-21 HISTORY — DX: Headache: R51

## 2017-12-21 HISTORY — PX: COLONOSCOPY WITH PROPOFOL: SHX5780

## 2017-12-21 HISTORY — DX: Headache, unspecified: R51.9

## 2017-12-21 LAB — POCT PREGNANCY, URINE: PREG TEST UR: NEGATIVE

## 2017-12-21 SURGERY — COLONOSCOPY WITH PROPOFOL
Anesthesia: General

## 2017-12-21 MED ORDER — MIDAZOLAM HCL 2 MG/2ML IJ SOLN
INTRAMUSCULAR | Status: AC
  Filled 2017-12-21: qty 2

## 2017-12-21 MED ORDER — SODIUM CHLORIDE 0.9 % IV SOLN
INTRAVENOUS | Status: DC
  Administered 2017-12-21 (×2): via INTRAVENOUS

## 2017-12-21 MED ORDER — LIDOCAINE HCL (CARDIAC) 20 MG/ML IV SOLN
INTRAVENOUS | Status: DC | PRN
Start: 2017-12-21 — End: 2017-12-21
  Administered 2017-12-21: 40 mg via INTRAVENOUS

## 2017-12-21 MED ORDER — EPHEDRINE SULFATE 50 MG/ML IJ SOLN
INTRAMUSCULAR | Status: DC | PRN
  Administered 2017-12-21: 10 mg via INTRAVENOUS

## 2017-12-21 MED ORDER — PROPOFOL 500 MG/50ML IV EMUL
INTRAVENOUS | Status: AC
  Filled 2017-12-21: qty 50

## 2017-12-21 MED ORDER — FENTANYL CITRATE (PF) 100 MCG/2ML IJ SOLN
INTRAMUSCULAR | Status: DC | PRN
  Administered 2017-12-21: 50 ug via INTRAVENOUS

## 2017-12-21 MED ORDER — FENTANYL CITRATE (PF) 100 MCG/2ML IJ SOLN
INTRAMUSCULAR | Status: AC
  Filled 2017-12-21: qty 2

## 2017-12-21 MED ORDER — PROPOFOL 10 MG/ML IV BOLUS
INTRAVENOUS | Status: DC | PRN
  Administered 2017-12-21: 50 mg via INTRAVENOUS

## 2017-12-21 MED ORDER — MIDAZOLAM HCL 2 MG/2ML IJ SOLN
INTRAMUSCULAR | Status: DC | PRN
  Administered 2017-12-21: 2 mg via INTRAVENOUS

## 2017-12-21 MED ORDER — PROPOFOL 500 MG/50ML IV EMUL
INTRAVENOUS | Status: DC | PRN
  Administered 2017-12-21: 200 ug/kg/min via INTRAVENOUS

## 2017-12-21 NOTE — Transfer of Care (Signed)
Immediate Anesthesia Transfer of Care Note  Patient: Andrea Bernard  Procedure(s) Performed: COLONOSCOPY WITH PROPOFOL (N/A )  Patient Location: PACU  Anesthesia Type:General  Level of Consciousness: awake  Airway & Oxygen Therapy: Patient Spontanous Breathing and Patient connected to nasal cannula oxygen  Post-op Assessment: Report given to RN and Post -op Vital signs reviewed and stable  Post vital signs: Reviewed and stable  Last Vitals:  Vitals:   12/21/17 0910 12/21/17 1000  BP: 103/75 134/88  Pulse: 62 69  Resp: 18 (!) 21  Temp: 36.4 C (!) 36.2 C  SpO2: 100% 100%    Last Pain:  Vitals:   12/21/17 1000  TempSrc: Tympanic         Complications: No apparent anesthesia complications

## 2017-12-21 NOTE — Anesthesia Post-op Follow-up Note (Signed)
Anesthesia QCDR form completed.        

## 2017-12-21 NOTE — Op Note (Signed)
[facility] Gastroenterology Patient Name: Andrea Bernard Procedure Date: 12/21/2017 9:22 AM MRN: [id number] Account #: [id number] Date of Birth: May 29, 1966 Admit Type: Outpatient Age: 52 Room: [facility] ENDO ROOM 2 Gender: Female Note Status: Finalized Procedure:            Colonoscopy Indications:          Iron deficiency anemia Providers:            [name] MD, MD Referring MD:         [name], MD (Referring MD) Medicines:            Monitored Anesthesia Care Complications:        No immediate complications. Procedure:            Pre-Anesthesia Assessment:                       - Prior to the procedure, a History and Physical was                        performed, and patient medications, allergies and                        sensitivities were reviewed. The patient's tolerance of                        previous anesthesia was reviewed.                       - The risks and benefits of the procedure and the                        sedation options and risks were discussed with the                        patient. All questions were answered and informed                        consent was obtained.                       - ASA Grade Assessment: II - A patient with mild                        systemic disease.                       After obtaining informed consent, the colonoscope was                        passed under direct vision. Throughout the procedure,                        the patient's blood pressure, pulse, and oxygen                        saturations were monitored continuously. The                        Colonoscope was introduced through the anus and  advanced to the the cecum, identified by the                        appendiceal orifice, IC valve and transillumination.                        The colonoscopy was performed with ease. The patient                        tolerated the procedure well. The quality of the bowel                        preparation was adequate. Findings:      The perianal and digital rectal examinations were normal.      The entire examined colon appeared normal on direct and retroflexion       views. Impression:           - The entire examined colon is normal on direct and                        retroflexion views.                       - No specimens collected. Recommendation:       - Discharge patient to home (with escort).                       - Resume previous diet.                       - Continue present medications.                       - To visualize the small bowel, perform video capsule                        endoscopy in 2 weeks.                       - Return to my office in 4 weeks. Procedure Code(s):    --- Professional ---                       [phone], Colonoscopy, flexible; diagnostic, including                        collection of specimen(s) by brushing or washing, when                        performed (separate procedure) Diagnosis Code(s):    --- Professional ---                       D50.9, Iron deficiency anemia, unspecified CPT copyright 2016 American Medical Association. All rights reserved. The codes documented in this report are preliminary and upon coder review may  be revised to meet current compliance requirements. [name], MD [name] MD, MD 12/21/2017 9:55:45 AM This report has been signed electronically. Number of Addenda: 0 Note Initiated On: 12/21/2017 9:22 AM Scope Withdrawal Time: 0 hours 15 minutes 19 seconds  Total Procedure Duration: 0 hours 22 minutes 58 seconds       [facility]

## 2017-12-21 NOTE — Anesthesia Postprocedure Evaluation (Signed)
Anesthesia Post Note  Patient: Andrea Bernard  Procedure(s) Performed: COLONOSCOPY WITH PROPOFOL (N/A )  Patient location during evaluation: Endoscopy Anesthesia Type: General Level of consciousness: awake and alert and oriented Pain management: pain level controlled Vital Signs Assessment: post-procedure vital signs reviewed and stable Respiratory status: spontaneous breathing, nonlabored ventilation and respiratory function stable Cardiovascular status: blood pressure returned to baseline and stable Postop Assessment: no signs of nausea or vomiting Anesthetic complications: no     Last Vitals:  Vitals:   12/21/17 1010 12/21/17 1030  BP: (!) 104/59 (!) 95/59  Pulse: 62 60  Resp: 17 (!) 21  Temp:    SpO2: 100% 100%    Last Pain:  Vitals:   12/21/17 1000  TempSrc: Tympanic                 Connor Meacham

## 2017-12-21 NOTE — Anesthesia Preprocedure Evaluation (Signed)
Anesthesia Evaluation  Patient identified by MRN, date of birth, ID band Patient awake    Reviewed: Allergy & Precautions, NPO status , Patient's Chart, lab work & pertinent test results  History of Anesthesia Complications Negative for: history of anesthetic complications  Airway Mallampati: II  TM Distance: >3 FB Neck ROM: Full    Dental no notable dental hx.    Pulmonary neg pulmonary ROS, neg sleep apnea, neg COPD,    breath sounds clear to auscultation- rhonchi (-) wheezing      Cardiovascular Exercise Tolerance: Good (-) hypertension(-) CAD, (-) Past MI, (-) Cardiac Stents and (-) CABG  Rhythm:Regular Rate:Normal - Systolic murmurs and - Diastolic murmurs    Neuro/Psych  Headaches, negative psych ROS   GI/Hepatic Neg liver ROS, GERD  ,  Endo/Other  negative endocrine ROSneg diabetes  Renal/GU negative Renal ROS     Musculoskeletal  (+) Arthritis ,   Abdominal (+) - obese,   Peds  Hematology  (+) anemia ,   Anesthesia Other Findings Past Medical History: No date: Arthritis No date: GERD (gastroesophageal reflux disease) No date: Headache 06/15/2017: Iron deficiency anemia No date: Neurofibromatosis ([place])   Reproductive/Obstetrics                             Anesthesia Physical Anesthesia Plan  ASA: II  Anesthesia Plan: General   Post-op Pain Management:    Induction: Intravenous  PONV Risk Score and Plan: 2 and Propofol infusion  Airway Management Planned: Natural Airway  Additional Equipment:   Intra-op Plan:   Post-operative Plan:   Informed Consent: I have reviewed the patients History and Physical, chart, labs and discussed the procedure including the risks, benefits and alternatives for the proposed anesthesia with the patient or authorized representative who has indicated his/her understanding and acceptance.   Dental advisory given  Plan Discussed with:  CRNA and Anesthesiologist  Anesthesia Plan Comments:         Anesthesia Quick Evaluation

## 2017-12-21 NOTE — H&P (Signed)
[name], MD [address] [place], [place], [place], [address] [id number], [place], [place], [place], [address] Phone: [phone]  Fax: [phone]  Primary Care Physician:  [name], MD   Pre-Procedure History & Physical: HPI:  Andrea Bernard is a 52 y.o. female is here for an colonoscopy.   Past Medical History:  Diagnosis Date  . Arthritis   . GERD (gastroesophageal reflux disease)   . Headache   . Iron deficiency anemia 06/15/2017  . Neurofibromatosis [facility])     Past Surgical History:  Procedure Laterality Date  . carpel tunnel release    . CESAREAN SECTION     x 4  . COLONOSCOPY WITH PROPOFOL N/A 09/04/2017   Procedure: COLONOSCOPY WITH PROPOFOL;  Surgeon: [name], MD;  Location: [facility] ENDOSCOPY;  Service: Gastroenterology;  Laterality: N/A;  . ESOPHAGOGASTRODUODENOSCOPY (EGD) WITH PROPOFOL N/A 09/04/2017   Procedure: ESOPHAGOGASTRODUODENOSCOPY (EGD) WITH PROPOFOL;  Surgeon: [name], MD;  Location: [facility] ENDOSCOPY;  Service: Gastroenterology;  Laterality: N/A;  . HERNIA REPAIR    . TUBAL LIGATION    . TUMOR REMOVAL      Prior to Admission medications   Medication Sig Start Date End Date Taking? Authorizing Provider  acetaminophen (TYLENOL) 325 MG tablet Take 650 mg by mouth. 08/19/16  Yes [provider]  cyclobenzaprine (FLEXERIL) 10 MG tablet Take 10 mg by mouth daily. 08/20/17  Yes [provider]  gabapentin (NEURONTIN) 300 MG capsule Take 300 mg by mouth 3 (three) times daily. 01/29/16  Yes [provider]  OXYCONTIN 80 MG 12 hr tablet Take 80 mg by mouth every 12 (twelve) hours. 01/29/16  Yes [provider]  pantoprazole (PROTONIX) 40 MG tablet TAKE 1 TABLET BY MOUTH TWICE A DAY 12/07/17  Yes Boscia, Heather E, NP  famotidine (PEPCID) 20 MG tablet Take 20 mg by mouth daily. 08/20/17   [provider]  phentermine (ADIPEX-P) 37.5 MG tablet Take 1 tablet (37.5 mg total) by mouth daily before  breakfast. Patient not taking: Reported on 12/21/2017 12/08/17   Ronnell Freshwater, NP    Allergies as of 11/26/2017 - Review Complete 11/06/2017  Allergen Reaction Noted  . Nitrofuran derivatives Nausea And Vomiting 11/06/2017    Family History  Problem Relation Age of Onset  . Anemia Mother   . Bladder Cancer Neg Hx   . Kidney cancer Neg Hx   . Breast cancer Neg Hx     Social History   Socioeconomic History  . Marital status: Married    Spouse name: Not on file  . Number of children: Not on file  . Years of education: Not on file  . Highest education level: Not on file  Social Needs  . Financial resource strain: Not on file  . Food insecurity - worry: Not on file  . Food insecurity - inability: Not on file  . Transportation needs - medical: Not on file  . Transportation needs - non-medical: Not on file  Occupational History  . Not on file  Tobacco Use  . Smoking status: Never Smoker  . Smokeless tobacco: Never Used  Substance and Sexual Activity  . Alcohol use: No  . Drug use: Yes    Types: Other-see comments    Comment: Oxycotin  . Sexual activity: Yes    Birth control/protection: Surgical  Other Topics Concern  . Not on file  Social History Narrative  . Not on file    Review of Systems: See HPI,  otherwise negative ROS  Physical Exam: BP 103/75   Pulse 62   Temp 97.6 F (36.4 C) (Tympanic)   Resp 18   Ht 5\' 4"  (1.626 m)   Wt 155 lb (70.3 kg)   LMP 12/18/2017   SpO2 100%   BMI 26.61 kg/m  General:   Alert,  pleasant and cooperative in NAD Head:  Normocephalic and atraumatic. Neck:  Supple; no masses or thyromegaly. Lungs:  Clear throughout to auscultation, normal respiratory effort.    Heart:  +S1, +S2, Regular rate and rhythm, No edema. Abdomen:  Soft, nontender and nondistended. Normal bowel sounds, without guarding, and without rebound.   Neurologic:  Alert and  oriented x4;  grossly normal neurologically.  Impression/Plan: Andrea Bernard  is here for an colonoscopy to be performed for iron deficiency anemia.   Risks, benefits, limitations, and alternatives regarding  colonoscopy have been reviewed with the patient.  Questions have been answered.  All parties agreeable.   [name], MD  12/21/2017, 9:18 AM

## 2017-12-22 ENCOUNTER — Encounter: Payer: Self-pay | Admitting: Gastroenterology

## 2017-12-22 ENCOUNTER — Other Ambulatory Visit: Payer: Self-pay

## 2017-12-22 ENCOUNTER — Telehealth: Payer: Self-pay

## 2017-12-22 DIAGNOSIS — D509 Iron deficiency anemia, unspecified: Secondary | ICD-10-CM

## 2017-12-22 NOTE — Telephone Encounter (Signed)
-----   Message from [name], MD sent at 12/21/2017 10:02 AM EDT ----- Regarding: please arrange appointment   Andrea Bernard,  Please arrange capsule study    Dr [name]  Gastroenterology/Hepatology Pager: [phone]

## 2017-12-23 ENCOUNTER — Telehealth: Payer: Self-pay | Admitting: Gastroenterology

## 2017-12-23 NOTE — Telephone Encounter (Signed)
Andrea Bernard has to r/s her capsule study for a Friday the 19th or 26th of April.

## 2017-12-24 ENCOUNTER — Other Ambulatory Visit: Payer: Self-pay

## 2017-12-24 NOTE — Telephone Encounter (Signed)
Date changed from 4/5 to 4/12.  Contacting Endo (Trish)

## 2017-12-27 DIAGNOSIS — Z0001 Encounter for general adult medical examination with abnormal findings: Secondary | ICD-10-CM | POA: Insufficient documentation

## 2017-12-27 DIAGNOSIS — R635 Abnormal weight gain: Secondary | ICD-10-CM | POA: Insufficient documentation

## 2017-12-27 DIAGNOSIS — R300 Dysuria: Secondary | ICD-10-CM | POA: Insufficient documentation

## 2017-12-27 DIAGNOSIS — N920 Excessive and frequent menstruation with regular cycle: Secondary | ICD-10-CM | POA: Insufficient documentation

## 2018-01-05 ENCOUNTER — Other Ambulatory Visit: Payer: Self-pay

## 2018-01-05 ENCOUNTER — Encounter: Payer: Self-pay | Admitting: Nurse Practitioner

## 2018-01-05 ENCOUNTER — Ambulatory Visit: Payer: BLUE CROSS/BLUE SHIELD | Attending: Nurse Practitioner | Admitting: Nurse Practitioner

## 2018-01-05 VITALS — BP 110/79 | HR 61 | Temp 98.3°F | Resp 18 | Ht 64.0 in | Wt 155.0 lb

## 2018-01-05 DIAGNOSIS — D509 Iron deficiency anemia, unspecified: Secondary | ICD-10-CM | POA: Diagnosis not present

## 2018-01-05 DIAGNOSIS — G894 Chronic pain syndrome: Secondary | ICD-10-CM | POA: Insufficient documentation

## 2018-01-05 DIAGNOSIS — M545 Low back pain, unspecified: Secondary | ICD-10-CM | POA: Insufficient documentation

## 2018-01-05 DIAGNOSIS — M899 Disorder of bone, unspecified: Secondary | ICD-10-CM | POA: Diagnosis not present

## 2018-01-05 DIAGNOSIS — Z86018 Personal history of other benign neoplasm: Secondary | ICD-10-CM | POA: Diagnosis not present

## 2018-01-05 DIAGNOSIS — D3610 Benign neoplasm of peripheral nerves and autonomic nervous system, unspecified: Secondary | ICD-10-CM | POA: Diagnosis not present

## 2018-01-05 DIAGNOSIS — M542 Cervicalgia: Secondary | ICD-10-CM | POA: Diagnosis not present

## 2018-01-05 DIAGNOSIS — Z79891 Long term (current) use of opiate analgesic: Secondary | ICD-10-CM | POA: Insufficient documentation

## 2018-01-05 DIAGNOSIS — K219 Gastro-esophageal reflux disease without esophagitis: Secondary | ICD-10-CM | POA: Diagnosis not present

## 2018-01-05 DIAGNOSIS — M25552 Pain in left hip: Secondary | ICD-10-CM | POA: Diagnosis not present

## 2018-01-05 DIAGNOSIS — D179 Benign lipomatous neoplasm, unspecified: Secondary | ICD-10-CM

## 2018-01-05 DIAGNOSIS — M79601 Pain in right arm: Secondary | ICD-10-CM

## 2018-01-05 DIAGNOSIS — M25551 Pain in right hip: Secondary | ICD-10-CM | POA: Insufficient documentation

## 2018-01-05 DIAGNOSIS — Z789 Other specified health status: Secondary | ICD-10-CM | POA: Diagnosis not present

## 2018-01-05 DIAGNOSIS — G8929 Other chronic pain: Secondary | ICD-10-CM | POA: Insufficient documentation

## 2018-01-05 DIAGNOSIS — Z79899 Other long term (current) drug therapy: Secondary | ICD-10-CM

## 2018-01-05 NOTE — Progress Notes (Signed)
Safety precautions to be maintained throughout the outpatient stay will include: orient to surroundings, keep bed in low position, maintain call bell within reach at all times, provide assistance with transfer out of bed and ambulation.  

## 2018-01-05 NOTE — Patient Instructions (Signed)

## 2018-01-05 NOTE — Progress Notes (Signed)
Patient's Name: Andrea Bernard  MRN: [id number]  Referring Provider: [name], MD  DOB: 1966/09/01  PCP: [name], MD  DOS: 01/05/2018  Note by: Dionisio David NP  Service setting: Ambulatory outpatient  Specialty: Interventional Pain Management  Location: [facility] (AMB) Pain Management Facility    Patient type: New Patient    Primary Reason(s) for Visit: Initial Patient Evaluation CC: Neck Pain and Hip Pain (right and left)  HPI  Andrea Bernard is a 52 y.o. year old, female patient, who comes today for an initial evaluation. 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[place]); Pain of multiple extremities; Plexiform neurofibroma; Type 1 neurofibromatosis ([place]); Urinary tract infectious disease; Encounter for general adult medical examination with abnormal findings; Abnormal weight gain; Menorrhagia with regular cycle; Dysuria; Chronic pain syndrome; Long term current use of opiate analgesic; Pharmacologic therapy; Disorder of skeletal system; Problems influencing health status; Chronic neck pain (Primary Area of Pain)(right); Chronic pain of both hips  [facility] Area of Pain) (L>R); Chronic bilateral low back pain without sciatica  (Fourth Area of Pain)); and Chronic pain of right upper extremity (Secondary Area of Pain) on their problem list.. Her primarily concern today is the Neck Pain and Hip Pain (right and left)  Pain Assessment: Location: Right, Left Hip Radiating: neck pain radiates into right upper arm, hip pain does not radiate Onset: More than a month ago Duration: Chronic pain Quality: Constant, Burning, Stabbing, Aching, Throbbing Severity: 1 /10 (self-reported pain score)  Note: Reported level is compatible with observation.                          Effect on ADL: requires frequent change of position Timing: Constant Modifying factors: medications,  movement  Onset and Duration: Sudden and Gradual 1999  Cause of pain: These masses started growing through each pregnancy Severity: NAS-11 at its worse: 6/10, NAS-11 at its best: 1/10, NAS-11 now: 1/10 and NAS-11 on the average: 2/10 Timing: Morning, Night and After a period of immobility Aggravating Factors: Intercourse (sex), Prolonged sitting and Squatting Alleviating Factors: Medications and Standing Associated Problems: Numbness, Tingling, Pain that wakes patient up and Pain that does not allow patient to sleep Quality of Pain: Agonizing, Deep, Sharp, Tingling and Toothache-like Previous Examinations or Tests: Biopsy, CT scan, Endoscopy and MRI scan Previous Treatments: The patient denies previous treatments  The patient comes into the clinics today for the first time for a chronic pain management evaluation. According to the patient her primary area of pain is in her neck. She admits that some of the right side. She denies any previous surgery, interventional therapy. She admits that she has been to chiropractic in the past however that was in effective.  Her second area of pain is in her right upper extremity. She describes as aching pain with numbness and tingling. She denies any weakness.  Her third area of pain is in her hips. She admits the left side is greater than the right. She denies any interventional therapy physical therapy or recent images.  Fourth area of pain is in her lower back. She admits that it radiates into her buttocks. She admits that the pain is difficult to describe.  Today I took the time to provide the patient with information regarding this pain practice. The patient was informed that the practice is divided into two sections: an interventional pain management section,  as well as a completely separate and distinct medication management section. I explained that there are procedure days for interventional therapies, and evaluation days for follow-ups and  medication management. Because of the amount of documentation required during both, they are kept separated. This means that there is the possibility that she may be scheduled for a procedure on one day, and medication management the next. I have also informed her that because of staffing and facility limitations, this practice will no longer take patients for medication management only. To illustrate the reasons for this, I gave the patient the example of surgeons, and how inappropriate it would be to refer a patient to his/her care, just to write for the post-surgical antibiotics on a surgery done by a different surgeon.   Because interventional pain management is part of the board-certified specialty for the doctors, the patient was informed that joining this practice means that they are open to any and all interventional therapies. I made it clear that this does not mean that they will be forced to have any procedures done. What this means is that I believe interventional therapies to be essential part of the diagnosis and proper management of chronic pain conditions. Therefore, patients not interested in these interventional alternatives will be better served under the care of a different practitioner.  The patient was also made aware of my Comprehensive Pain Management Safety Guidelines where by joining this practice, they limit all of their nerve blocks and joint injections to those done by our practice, for as long as we are retained to manage their care. Historic Controlled Substance Pharmacotherapy Review  PMP and historical list of controlled substances: OxyContin 41m BID, Phentermine 37.566mHighest opioid analgesic regimen found: OxyContin 8032mID ( last fill date 12/04/17) Oxycodone 160 Most recent opioid analgesic: OxyContin 61m30mD ( last fill date 12/04/17) Oxycodone 160 Current opioid analgesics: OxyContin 61mg48m ( last fill date 12/04/17) Oxycodone 160 Highest recorded MME/day: 240  mg/day MME/day:240 mg/day Medications: The patient did not bring the medication(s) to the appointment, as requested in our "New Patient Package" Pharmacodynamics: Desired effects: Analgesia: The patient reports >50% benefit. Reported improvement in function: The patient reports medication allows her to accomplish basic ADLs. Clinically meaningful improvement in function (CMIF): Sustained CMIF goals met Perceived effectiveness: Described as relatively effective, allowing for increase in activities of daily living (ADL) Undesirable effects: Side-effects or Adverse reactions: None reported Historical Monitoring: The patient  reports that she has current or past drug history. Drug: Other-see comments. List of all UDS Test(s): No results found for: MDMA, COCAINSCRNUR, PCPSCRNUR, PCPQUANT, CANNABQUANT, THCU, ETH LSula of all Serum Drug Screening Test(s):  No results found for: AMPHSCRSER, BARBSCRSER, BENZOSCRSER, COCAINSCRSER, PCPSCRSER, PCPQUANT, THCSCRSER, CANNABQUANT, OPIATESCRSER, OXYSCRSER, PROPOXSCRSER Historical Background Evaluation: [place] PDMP: Six (6) year initial data search conducted.             [place] Department of public safety, offender search: (PublEditor, commissioningrmation) Non-contributory Risk Assessment Profile: Aberrant behavior: claims that "nothing else works" Risk factors for fatal opioid overdose: age [id number] old and caucasian Fatal overdose hazard ratio (HR): Calculation deferred Non-fatal overdose hazard ratio (HR): Calculation deferred Risk of opioid abuse or dependence: 0.7-3.0% with doses ? 36 MME/day and 6.1-26% with doses ? 120 MME/day. Substance use disorder (SUD) risk level: Pending results of Medical Psychology Evaluation for SUD Opioid risk tool (ORT) (Total Score): 0  ORT Scoring interpretation table:  Score <3 = Low Risk for SUD  Score between 4-7 = Moderate Risk  for SUD  Score >8 = High Risk for Opioid Abuse   PHQ-2 Depression Scale:  Total score: 0  PHQ-2  Scoring interpretation table: (Score and probability of major depressive disorder)  Score 0 = No depression  Score 1 = 15.4% Probability  Score 2 = 21.1% Probability  Score 3 = 38.4% Probability  Score 4 = 45.5% Probability  Score 5 = 56.4% Probability  Score 6 = 78.6% Probability   PHQ-9 Depression Scale:  Total score: 0  PHQ-9 Scoring interpretation table:  Score 0-4 = No depression  Score 5-9 = Mild depression  Score 10-14 = Moderate depression  Score 15-19 = Moderately severe depression  Score 20-27 = Severe depression (2.4 times higher risk of SUD and 2.89 times higher risk of overuse)   Pharmacologic Plan: Pending ordered tests and/or consults  Meds  The patient has a current medication list which includes the following prescription(s): acetaminophen, famotidine, gabapentin, oxycontin, and pantoprazole, and the following Facility-Administered Medications: iron sucrose (VENOFER) 200 mg IVPB and sodium chloride flush.  Current Outpatient Medications on File Prior to Visit  Medication Sig  . acetaminophen (TYLENOL) 325 MG tablet Take 650 mg by mouth.  . famotidine (PEPCID) 20 MG tablet Take 20 mg by mouth daily.  [name] gabapentin (NEURONTIN) 300 MG capsule Take 300 mg by mouth 3 (three) times daily.  . OXYCONTIN 80 MG 12 hr tablet Take 80 mg by mouth every 12 (twelve) hours.  . pantoprazole (PROTONIX) 40 MG tablet TAKE 1 TABLET BY MOUTH TWICE A DAY   Current Facility-Administered Medications on File Prior to Visit  Medication  . iron sucrose (VENOFER) 200 mg IVPB  . sodium chloride flush (NS) 0.9 % injection 10 mL   Imaging Review    Note: Available results from prior imaging studies were reviewed.        ROS  Cardiovascular History: No reported cardiovascular signs or symptoms such as High blood pressure, coronary artery disease, abnormal heart rate or rhythm, heart attack, blood thinner therapy or heart weakness and/or failure Pulmonary or Respiratory History: No reported  pulmonary signs or symptoms such as wheezing and difficulty taking a deep full breath (Asthma), difficulty blowing air out (Emphysema), coughing up mucus (Bronchitis), persistent dry cough, or temporary stoppage of breathing during sleep Neurological History: No reported neurological signs or symptoms such as seizures, abnormal skin sensations, urinary and/or fecal incontinence, being born with an abnormal open spine and/or a tethered spinal cord Review of Past Neurological Studies: No results found for this or any previous visit. Psychological-Psychiatric History: No reported psychological or psychiatric signs or symptoms such as difficulty sleeping, anxiety, depression, delusions or hallucinations (schizophrenial), mood swings (bipolar disorders) or suicidal ideations or attempts Gastrointestinal History: No reported gastrointestinal signs or symptoms such as vomiting or evacuating blood, reflux, heartburn, alternating episodes of diarrhea and constipation, inflamed or scarred liver, or pancreas or irrregular and/or infrequent bowel movements Genitourinary History: Peeing blood Hematological History: Weakness due to low blood hemoglobin or red blood cell count (Anemia) Endocrine History: No reported endocrine signs or symptoms such as high or low blood sugar, rapid heart rate due to high thyroid levels, obesity or weight gain due to slow thyroid or thyroid disease Rheumatologic History: No reported rheumatological signs and symptoms such as fatigue, joint pain, tenderness, swelling, redness, heat, stiffness, decreased range of motion, with or without associated rash Musculoskeletal History: Negative for myasthenia gravis, muscular dystrophy, multiple sclerosis or malignant hyperthermia Work History: Working full time  Allergies  Ms. Korpi  is allergic to nitrofuran derivatives.  Laboratory Chemistry  Inflammation Markers No results found for: CRP, ESRSEDRATE (CRP: Acute Phase) (ESR: Chronic  Phase) Renal Function Markers Lab Results  Component Value Date   BUN 12 10/29/2017   CREATININE 0.85 10/29/2017   GFRAA 92 10/29/2017   GFRNONAA 80 10/29/2017   Hepatic Function Markers Lab Results  Component Value Date   AST 21 10/29/2017   ALT 18 10/29/2017   ALBUMIN 4.2 10/29/2017   ALKPHOS 60 10/29/2017   Electrolytes Lab Results  Component Value Date   NA 139 10/29/2017   K 4.3 10/29/2017   CL 100 10/29/2017   CALCIUM 9.0 10/29/2017   Neuropathy Markers Lab Results  Component Value Date   VITAMINB12 730 10/29/2017   Bone Pathology Markers Lab Results  Component Value Date   ALKPHOS 60 10/29/2017   CALCIUM 9.0 10/29/2017   Coagulation Parameters Lab Results  Component Value Date   PLT 226 10/29/2017   Cardiovascular Markers Lab Results  Component Value Date   HGB 12.2 10/29/2017   HCT 36.7 10/29/2017   Note: Lab results reviewed.  [place]  Drug: Ms. Strand  reports that she has current or past drug history. Drug: Other-see comments. Alcohol:  reports that she does not drink alcohol. Tobacco:  reports that she has never smoked. She has never used smokeless tobacco. Medical:  has a past medical history of Arthritis, GERD (gastroesophageal reflux disease), Headache, Iron deficiency anemia (06/15/2017), and Neurofibromatosis ([place]). Family: family history includes Anemia in her mother.  Past Surgical History:  Procedure Laterality Date  . carpel tunnel release    . CESAREAN SECTION     x 4  . COLONOSCOPY WITH PROPOFOL N/A 09/04/2017   Procedure: COLONOSCOPY WITH PROPOFOL;  Surgeon: [name], MD;  Location[facility] ENDOSCOPY;  Service: Gastroenterology;  Laterality: N/A;  . COLONOSCOPY WITH PROPOFOL N/A 12/21/2017   Procedure: COLONOSCOPY WITH PROPOFOL;  Surgeon: [name], MD;  Location: [facility] ENDOSCOPY;  Service: Gastroenterology;  Laterality: N/A;  . ESOPHAGOGASTRODUODENOSCOPY (EGD) WITH PROPOFOL N/A 09/04/2017   Procedure: ESOPHAGOGASTRODUODENOSCOPY  (EGD) WITH PROPOFOL;  Surgeon: [name], MD;  Location: [facility] ENDOSCOPY;  Service: Gastroenterology;  Laterality: N/A;  . HERNIA REPAIR    . TUBAL LIGATION    . TUMOR REMOVAL     Active Ambulatory Problems    Diagnosis Date Noted  . Iron deficiency anemia 06/15/2017  . History of neuroblastoma 06/15/2017  . Chronic pain 06/15/2017  . Angiolipomatosis, familial 02/26/2017  . Lipoma of left upper extremity 02/25/2017  . Multiple lipomas 02/25/2017  . 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 Pain of multiple extremities 07/13/2015  . Plexiform neurofibroma 07/13/2015  . Type 1 neurofibromatosis ([place]) 07/17/2015  . Urinary tract infectious disease 10/27/2017  . Encounter for general adult medical examination with abnormal findings 12/27/2017  . Abnormal weight gain 12/27/2017  . Menorrhagia with regular cycle 12/27/2017  . Dysuria 12/27/2017  . Chronic pain syndrome 01/05/2018  . Long term current use of opiate analgesic 01/05/2018  . Pharmacologic therapy 01/05/2018  . Disorder of skeletal system 01/05/2018  . Problems influencing health status 01/05/2018  . Chronic neck pain (Primary Area of Pain)(right) 01/05/2018  . Chronic pain of both hips  [facility] Area of Pain) (L>R) 01/05/2018  . Chronic bilateral low back pain without sciatica  (Fourth Area of Pain)) 01/05/2018  . Chronic pain of right upper extremity (Secondary Area of Pain) 01/05/2018   Resolved Ambulatory Problems    Diagnosis Date Noted  . No Resolved  Ambulatory Problems   Past Medical History:  Diagnosis Date  . Arthritis   . GERD (gastroesophageal reflux disease)   . Headache   . Iron deficiency anemia 06/15/2017  . Neurofibromatosis ([place])    Constitutional Exam  General appearance: Well nourished, well developed, and well hydrated. In no apparent acute distress Vitals:   01/05/18 0804  BP: 110/79  Pulse: 61  Resp: 18  Temp: 98.3 F (36.8 C)  TempSrc: Oral  SpO2: 100%  Weight: 155 lb (70.3 kg)  Height: 5'  4" (1.626 m)   BMI Assessment: Estimated body mass index is 26.61 kg/m as calculated from the following:   Height as of this encounter: 5' 4"  (1.626 m).   Weight as of this encounter: 155 lb (70.3 kg).  BMI interpretation table: BMI level Category Range association with higher incidence of chronic pain  <18 kg/m2 Underweight   18.5-24.9 kg/m2 Ideal body weight   25-29.9 kg/m2 Overweight Increased incidence by 20%  30-34.9 kg/m2 Obese (Class I) Increased incidence by 68%  35-39.9 kg/m2 Severe obesity (Class II) Increased incidence by 136%  >40 kg/m2 Extreme obesity (Class III) Increased incidence by 254%   BMI Readings from Last 4 Encounters:  01/05/18 26.61 kg/m  12/21/17 26.61 kg/m  12/08/17 27.50 kg/m  11/06/17 27.41 kg/m   Wt Readings from Last 4 Encounters:  01/05/18 155 lb (70.3 kg)  12/21/17 155 lb (70.3 kg)  12/08/17 160 lb 3.2 oz (72.7 kg)  11/06/17 159 lb 11.2 oz (72.4 kg)  Psych/Mental status: Alert, oriented x 3 (person, place, & time)       Eyes: PERLA Respiratory: No evidence of acute respiratory distress  Cervical Spine Exam  Inspection: No masses, redness, or swelling Alignment: Symmetrical Functional ROM: Unrestricted ROM      Stability: No instability detected Muscle strength & Tone: Functionally intact Sensory: Unimpaired Palpation: No palpable anomalies              Upper Extremity (UE) Exam    Side: Right upper extremity  Side: Left upper extremity  Inspection: No masses, redness, swelling, or asymmetry. No contractures  Inspection: No masses, redness, swelling, or asymmetry. No contractures  Functional ROM: Unrestricted ROM          Functional ROM: Unrestricted ROM          Muscle strength & Tone: Functionally intact  Muscle strength & Tone: Functionally intact  Sensory: Unimpaired  Sensory: Unimpaired  Palpation: No palpable anomalies              Palpation: No palpable anomalies              Specialized Test(s): Deferred         Specialized  Test(s): Deferred          Thoracic Spine Exam  Inspection: No masses, redness, or swelling Alignment: Symmetrical Functional ROM: Unrestricted ROM Stability: No instability detected Sensory: Unimpaired Muscle strength & Tone: No palpable anomalies  Lumbar Spine Exam  Inspection: No masses, redness, or swelling Alignment: Symmetrical Functional ROM: Unrestricted ROM      Stability: No instability detected Muscle strength & Tone: Functionally intact Sensory: Unimpaired Palpation: No palpable anomalies       Provocative Tests: Lumbar Hyperextension and rotation test: evaluation deferred today       Patrick's Maneuver: evaluation deferred today                    Gait & Posture Assessment  Ambulation: Unassisted Gait: Relatively normal  for age and body habitus Posture: WNL   Lower Extremity Exam    Side: Right lower extremity  Side: Left lower extremity  Inspection: No masses, redness, swelling, or asymmetry. No contractures  Inspection: No masses, redness, swelling, or asymmetry. No contractures  Functional ROM: Unrestricted ROM          Functional ROM: Unrestricted ROM          Muscle strength & Tone: Able to Toe-walk & Heel-walk without problems  Muscle strength & Tone: Able to Toe-walk & Heel-walk without problems  Sensory: Unimpaired  Sensory: Unimpaired  Palpation: No palpable anomalies  Palpation: No palpable anomalies   Assessment  Primary Diagnosis & Pertinent Problem List: The primary encounter diagnosis was Chronic neck pain (Primary Area of Pain)(right). Diagnoses of Chronic pain of right upper extremity (Secondary Area of Pain), Chronic pain of both hips (Secondary Area of Pain) (L>R), Chronic bilateral low back pain without sciatica (Tertiary Area of Pain), Angiolipomatosis, familial, Chronic pain syndrome, Long term current use of opiate analgesic, Disorder of skeletal system, Pharmacologic therapy, and Problems influencing health status were also pertinent to this  visit.  Visit Diagnosis: 1. Chronic neck pain (Primary Area of Pain)(right)   2. Chronic pain of right upper extremity (Secondary Area of Pain)   3. Chronic pain of both hips (Secondary Area of Pain) (L>R)   4. Chronic bilateral low back pain without sciatica [facility] Area of Pain)   5. Angiolipomatosis, familial   6. Chronic pain syndrome   7. Long term current use of opiate analgesic   8. Disorder of skeletal system   9. Pharmacologic therapy   10. Problems influencing health status    Plan of Care  Initial treatment plan:  Please be advised that as per protocol, today's visit has been an evaluation only. We have not taken over the patient's controlled substance management.  Problem-specific plan: No problem-specific Assessment & Plan notes found for this encounter.  Ordered Lab-work, Procedure(s), Referral(s), & Consult(s): Orders Placed This Encounter  Procedures  . DG Cervical Spine With Flex & Extend  . DG Lumbar Spine Complete W/Bend  . Compliance Drug Analysis, Ur  . Comp. Metabolic Panel (12)  . Magnesium  . Vitamin B12  . Sedimentation rate  . 25-Hydroxyvitamin D Lcms D2+D3  . C-reactive protein  . Ambulatory referral to Psychology   Pharmacotherapy: Medications ordered:  No orders of the defined types were placed in this encounter.  Medications administered during this visit: Cindy L. Maturin "Jenny Reichmann" had no medications administered during this visit.   Pharmacotherapy under consideration:  Opioid Analgesics: The patient was informed that there is no guarantee that she would be a candidate for opioid analgesics. The decision will be made following CDC guidelines. This decision will be based on the results of diagnostic studies, as well as Ms. Shere's risk profile. Explained to patient that her current dose would be reduced secondary to the new guidelines. Membrane stabilizer: To be determined at a later time Muscle relaxant: To be determined at a later  time NSAID: To be determined at a later time Other analgesic(s): To be determined at a later time   Interventional therapies under consideration: Ms. Benedick was informed that there is no guarantee that she would be a candidate for interventional therapies. The decision will be based on the results of diagnostic studies, as well as Ms. Reddinger's risk profile.  Possible procedure(s):  trigger point injections Diagnostic right-sided CESI diagnostic right sided cervical facet nerve block  Possible  right sided cervical facet RFA Diagnostic bilateral intra-articular hip injections Diagnostic bilateral sacroiliac joint injections                                                                                                    Provider-requested follow-up: Return for 2nd Visit, w/ Dr. Dossie Arbour, after [facility] eval, xrays.  Future Appointments  Date Time Provider [place]  02/05/2018  3:45 PM [facility] None  02/19/2018 11:30 AM [facility] None    Primary Care Physician: [name], MD Location: [facility] Outpatient Pain Management Facility Note by:  Date: 01/05/2018; Time: 4:01 PM  Pain Score Disclaimer: We use the NRS-11 scale. This is a self-reported, subjective measurement of pain severity with only modest accuracy. It is used primarily to identify changes within a particular patient. It must be understood that outpatient pain scales are significantly less accurate that those used for research, where they can be applied under ideal controlled circumstances with minimal exposure to variables. In reality, the score is likely to be a combination of pain intensity and pain affect, where pain affect describes the degree of emotional arousal or changes in action readiness caused by the sensory experience of pain. Factors such as social and work situation, setting, emotional state, anxiety levels, expectation, and prior pain experience may influence pain  perception and show large inter-individual differences that may also be affected by time variables.  Patient instructions provided during this appointment: Patient Instructions   ____________________________________________________________________________________________  Appointment Policy Summary  It is our goal and responsibility to provide the medical community with assistance in the evaluation and management of patients with chronic pain. Unfortunately our resources are limited. Because we do not have an unlimited amount of time, or available appointments, we are required to closely monitor and manage their use. The following rules exist to maximize their use:  Patient's responsibilities: 1. Punctuality:  At what time should I arrive? You should be physically present in our office 30 minutes before your scheduled appointment. Your scheduled appointment is with your assigned healthcare provider. However, it takes 5-10 minutes to be "checked-in", and another 15 minutes for the nurses to do the admission. If you arrive to our office at the time you were given for your appointment, you will end up being at least 20-25 minutes late to your appointment with the provider. 2. Tardiness:  What happens if I arrive only a few minutes after my scheduled appointment time? You will need to reschedule your appointment. The cutoff is your appointment time. This is why it is so important that you arrive at least 30 minutes before that appointment. If you have an appointment scheduled for 10:00 AM and you arrive at 10:01, you will be required to reschedule your appointment.  3. Plan ahead:  Always assume that you will encounter traffic on your way in. Plan for it. If you are dependent on a driver, make sure they understand these rules and the need to arrive early. 4. Other appointments and responsibilities:  Avoid scheduling any other appointments before or after your pain clinic appointments.  5. Be  prepared:  Write down everything that you need to discuss with your healthcare provider and give this information to the admitting nurse. Write down the medications that you will need refilled. Bring your pills and bottles (even the empty ones), to all of your appointments, except for those where a procedure is scheduled. 6. No children or pets:  Find someone to take care of them. It is not appropriate to bring them in. 7. Scheduling changes:  We request "advanced notification" of any changes or cancellations. 8. Advanced notification:  Defined as a time period of more than 24 hours prior to the originally scheduled appointment. This allows for the appointment to be offered to other patients. 9. Rescheduling:  When a visit is rescheduled, it will require the cancellation of the original appointment. For this reason they both fall within the category of "Cancellations".  10. Cancellations:  They require advanced notification. Any cancellation less than 24 hours before the  appointment will be recorded as a "No Show". 11. No Show:  Defined as an unkept appointment where the patient failed to notify or declare to the practice their intention or inability to keep the appointment.  Corrective process for repeat offenders:  1. Tardiness: Three (3) episodes of rescheduling due to late arrivals will be recorded as one (1) "No Show". 2. Cancellation or reschedule: Three (3) cancellations or rescheduling will be recorded as one (1) "No Show". 3. "No Shows": Three (3) "No Shows" within a 12 month period will result in discharge from the practice. ____________________________________________________________________________________________  ____________________________________________________________________________________________  Pain Scale  Introduction: The pain score used by this practice is the Verbal Numerical Rating Scale (VNRS-11). This is an 11-point scale. It is for adults and children 10  years or older. There are significant differences in how the pain score is reported, used, and applied. Forget everything you learned in the past and learn this scoring system.  General Information: The scale should reflect your current level of pain. Unless you are specifically asked for the level of your worst pain, or your average pain. If you are asked for one of these two, then it should be understood that it is over the past 24 hours.  Basic Activities of Daily Living (ADL): Personal hygiene, dressing, eating, transferring, and using restroom.  Instructions: Most patients tend to report their level of pain as a combination of two factors, their physical pain and their psychosocial pain. This last one is also known as "suffering" and it is reflection of how physical pain affects you socially and psychologically. From now on, report them separately. From this point on, when asked to report your pain level, report only your physical pain. Use the following table for reference.  Pain Clinic Pain Levels (0-5/10)  Pain Level Score  Description  No Pain 0   Mild pain 1 Nagging, annoying, but does not interfere with basic activities of daily living (ADL). Patients are able to eat, bathe, get dressed, toileting (being able to get on and off the toilet and perform personal hygiene functions), transfer (move in and out of bed or a chair without assistance), and maintain continence (able to control bladder and bowel functions). Blood pressure and heart rate are unaffected. A normal heart rate for a healthy adult ranges from 60 to 100 bpm (beats per minute).   Mild to moderate pain 2 Noticeable and distracting. Impossible to hide from other people. More frequent flare-ups. Still possible to adapt and function close to normal. It can be  very annoying and may have occasional stronger flare-ups. With discipline, patients may get used to it and adapt.   Moderate pain 3 Interferes significantly with activities of  daily living (ADL). It becomes difficult to feed, bathe, get dressed, get on and off the toilet or to perform personal hygiene functions. Difficult to get in and out of bed or a chair without assistance. Very distracting. With effort, it can be ignored when deeply involved in activities.   Moderately severe pain 4 Impossible to ignore for more than a few minutes. With effort, patients may still be able to manage work or participate in some social activities. Very difficult to concentrate. Signs of autonomic nervous system discharge are evident: dilated pupils (mydriasis); mild sweating (diaphoresis); sleep interference. Heart rate becomes elevated (>115 bpm). Diastolic blood pressure (lower number) rises above 100 mmHg. Patients find relief in laying down and not moving.   Severe pain 5 Intense and extremely unpleasant. Associated with frowning face and frequent crying. Pain overwhelms the senses.  Ability to do any activity or maintain social relationships becomes significantly limited. Conversation becomes difficult. Pacing back and forth is common, as getting into a comfortable position is nearly impossible. Pain wakes you up from deep sleep. Physical signs will be obvious: pupillary dilation; increased sweating; goosebumps; brisk reflexes; cold, clammy hands and feet; nausea, vomiting or dry heaves; loss of appetite; significant sleep disturbance with inability to fall asleep or to remain asleep. When persistent, significant weight loss is observed due to the complete loss of appetite and sleep deprivation.  Blood pressure and heart rate becomes significantly elevated. Caution: If elevated blood pressure triggers a pounding headache associated with blurred vision, then the patient should immediately seek attention at an urgent or emergency care unit, as these may be signs of an impending stroke.    Emergency Department Pain Levels (6-10/10)  Emergency Room Pain 6 Severely limiting. Requires emergency  care and should not be seen or managed at an outpatient pain management facility. Communication becomes difficult and requires great effort. Assistance to reach the emergency department may be required. Facial flushing and profuse sweating along with potentially dangerous increases in heart rate and blood pressure will be evident.   Distressing pain 7 Self-care is very difficult. Assistance is required to transport, or use restroom. Assistance to reach the emergency department will be required. Tasks requiring coordination, such as bathing and getting dressed become very difficult.   Disabling pain 8 Self-care is no longer possible. At this level, pain is disabling. The individual is unable to do even the most "basic" activities such as walking, eating, bathing, dressing, transferring to a bed, or toileting. Fine motor skills are lost. It is difficult to think clearly.   Incapacitating pain 9 Pain becomes incapacitating. Thought processing is no longer possible. Difficult to remember your own name. Control of movement and coordination are lost.   The worst pain imaginable 10 At this level, most patients pass out from pain. When this level is reached, collapse of the autonomic nervous system occurs, leading to a sudden drop in blood pressure and heart rate. This in turn results in a temporary and dramatic drop in blood flow to the brain, leading to a loss of consciousness. Fainting is one of the body's self defense mechanisms. Passing out puts the brain in a calmed state and causes it to shut down for a while, in order to begin the healing process.    Summary: 1. Refer to this scale when providing [place] with  your pain level. 2. Be accurate and careful when reporting your pain level. This will help with your care. 3. Over-reporting your pain level will lead to loss of credibility. 4. Even a level of 1/10 means that there is pain and will be treated at our facility. 5. High, inaccurate reporting will be  documented as "Symptom Exaggeration", leading to loss of credibility and suspicions of possible secondary gains such as obtaining more narcotics, or wanting to appear disabled, for fraudulent reasons. 6. Only pain levels of 5 or below will be seen at our facility. 7. Pain levels of 6 and above will be sent to the Emergency Department and the appointment cancelled. ____________________________________________________________________________________________

## 2018-01-09 LAB — COMPLIANCE DRUG ANALYSIS, UR

## 2018-01-11 LAB — COMP. METABOLIC PANEL (12)
A/G RATIO: 2 (ref 1.2–2.2)
ALBUMIN: 4.2 g/dL (ref 3.5–5.5)
AST: 35 IU/L (ref 0–40)
Alkaline Phosphatase: 77 IU/L (ref 39–117)
BILIRUBIN TOTAL: 0.2 mg/dL (ref 0.0–1.2)
BUN / CREAT RATIO: 12 (ref 9–23)
BUN: 9 mg/dL (ref 6–24)
CHLORIDE: 101 mmol/L (ref 96–106)
CREATININE: 0.77 mg/dL (ref 0.57–1.00)
Calcium: 9.1 mg/dL (ref 8.7–10.2)
GFR calc Af Amer: 103 mL/min/{1.73_m2} (ref >59)
GFR calc non Af Amer: 90 mL/min/{1.73_m2} (ref >59)
Globulin, Total: 2.1 g/dL (ref 1.5–4.5)
Glucose: 89 mg/dL (ref 65–99)
Potassium: 4.6 mmol/L (ref 3.5–5.2)
SODIUM: 142 mmol/L (ref 134–144)
TOTAL PROTEIN: 6.3 g/dL (ref 6.0–8.5)

## 2018-01-11 LAB — 25-HYDROXYVITAMIN D LCMS D2+D3: 25-HYDROXY, VITAMIN D-2: 1 ng/mL

## 2018-01-11 LAB — MAGNESIUM: MAGNESIUM: 1.9 mg/dL (ref 1.6–2.3)

## 2018-01-11 LAB — VITAMIN B12: Vitamin B-12: 601 pg/mL (ref 232–1245)

## 2018-01-11 LAB — SEDIMENTATION RATE: Sed Rate: 2 mm/hr (ref 0–40)

## 2018-01-11 LAB — C-REACTIVE PROTEIN: CRP: <0.3 mg/L (ref 0.0–4.9)

## 2018-01-11 LAB — 25-HYDROXY VITAMIN D LCMS D2+D3
25-Hydroxy, Vitamin D-3: 35 ng/mL
25-Hydroxy, Vitamin D: 36 ng/mL

## 2018-01-12 ENCOUNTER — Telehealth: Payer: Self-pay

## 2018-01-12 ENCOUNTER — Ambulatory Visit: Payer: BLUE CROSS/BLUE SHIELD | Admitting: Nurse Practitioner

## 2018-01-12 NOTE — Telephone Encounter (Signed)
The patient called back wanting a letter of referral for her insurance stating why she was referred out the first time. It was due to her not having procedures and being a medication management patient only. If we can give her a letter for her insurance company they will let her stay at the pain clinic she is at, even though it is out of network and they will cover it. Can you do this for her?

## 2018-01-15 ENCOUNTER — Ambulatory Visit
Admission: RE | Admit: 2018-01-15 | Payer: BLUE CROSS/BLUE SHIELD | Source: Ambulatory Visit | Admitting: Gastroenterology

## 2018-01-15 ENCOUNTER — Encounter: Admission: RE | Payer: Self-pay | Source: Ambulatory Visit

## 2018-01-15 ENCOUNTER — Encounter: Payer: Self-pay | Admitting: Certified Registered"

## 2018-01-15 SURGERY — IMAGING PROCEDURE, GI TRACT, INTRALUMINAL, VIA CAPSULE
Anesthesia: General

## 2018-01-15 MED ORDER — GLYCOPYRROLATE 0.2 MG/ML IJ SOLN
INTRAMUSCULAR | Status: AC
  Filled 2018-01-15: qty 1

## 2018-01-15 MED ORDER — LIDOCAINE HCL (PF) 2 % IJ SOLN
INTRAMUSCULAR | Status: AC
  Filled 2018-01-15: qty 10

## 2018-01-15 MED ORDER — PROPOFOL 10 MG/ML IV BOLUS
INTRAVENOUS | Status: AC
  Filled 2018-01-15: qty 20

## 2018-01-15 MED ORDER — PHENYLEPHRINE HCL 10 MG/ML IJ SOLN
INTRAMUSCULAR | Status: AC
  Filled 2018-01-15: qty 1

## 2018-01-15 MED ORDER — PROPOFOL 500 MG/50ML IV EMUL
INTRAVENOUS | Status: AC
  Filled 2018-01-15: qty 50

## 2018-01-19 NOTE — Telephone Encounter (Signed)
Please let her know if you can do a letter for her insurance company stating that we cannot see her for medication management.

## 2018-01-19 NOTE — Telephone Encounter (Signed)
It has been taken care of. I typed up a letter, Dr. Dossie Arbour reviewed it and signed it. I have put it in the mail to the patient. Thank you

## 2018-01-19 NOTE — Telephone Encounter (Signed)
He has declined to have [place] complete this at this time. He has indicated that this for [place] to do. I suggest that she comes and see her options as it relates to treatment.

## 2018-01-21 ENCOUNTER — Ambulatory Visit: Payer: BLUE CROSS/BLUE SHIELD | Admitting: Nurse Practitioner

## 2018-01-21 ENCOUNTER — Encounter: Payer: Self-pay | Admitting: Nurse Practitioner

## 2018-01-21 VITALS — BP 96/58 | HR 59 | Resp 16 | Ht 64.0 in | Wt 160.0 lb

## 2018-01-21 DIAGNOSIS — J0100 Acute maxillary sinusitis, unspecified: Secondary | ICD-10-CM | POA: Diagnosis not present

## 2018-01-21 DIAGNOSIS — J300 Vasomotor rhinitis: Secondary | ICD-10-CM | POA: Diagnosis not present

## 2018-01-21 MED ORDER — AZITHROMYCIN 250 MG PO TABS: ORAL_TABLET | ORAL | 0 refills | Status: DC

## 2018-01-21 MED ORDER — FLUTICASONE PROPIONATE 50 MCG/ACT NA SUSP: 2.0000 | Freq: Every day | NASAL | 6 refills | Status: DC

## 2018-01-21 NOTE — Progress Notes (Signed)
[facility] [place], [place] [address]  Internal MEDICINE  Office Visit Note  Patient Name: Andrea Bernard  [id number]  [id number]  Date of Service: 02/10/2018   Pt is here for a sick visit.  Chief Complaint  Patient presents with  . Sinusitis    been going on for about three weeks taking OTC meds and not working much. no fevers. no chills, no vomiting. 1-2 months ago became dizzy for about three hours and then it went away.     The patient is here for sick visit. April 1, she started having headache in maxillary sinus region, on the right side. She is congested. She denies sore throat or fever. Taking motrin will alleviate the headache for a few hours, but comes right back.    Current Medication:  Outpatient Encounter Medications as of 01/21/2018  Medication Sig  . acetaminophen (TYLENOL) 325 MG tablet Take 650 mg by mouth.  . gabapentin (NEURONTIN) 300 MG capsule Take 300 mg by mouth 3 (three) times daily.  . OXYCONTIN 80 MG 12 hr tablet Take 80 mg by mouth every 12 (twelve) hours.  . pantoprazole (PROTONIX) 40 MG tablet TAKE 1 TABLET BY MOUTH TWICE A DAY  . azithromycin (ZITHROMAX) 250 MG tablet Take 2 tablets po on day one then take 1 tablet po days 2 through 10  . famotidine (PEPCID) 20 MG tablet Take 20 mg by mouth daily.  . fluticasone (FLONASE) 50 MCG/ACT nasal spray Place 2 sprays into both nostrils daily.   Facility-Administered Encounter Medications as of 01/21/2018  Medication  . iron sucrose (VENOFER) 200 mg IVPB  . sodium chloride flush (NS) 0.9 % injection 10 mL      Medical History: Past Medical History:  Diagnosis Date  . Arthritis   . GERD (gastroesophageal reflux disease)   . Headache   . Iron deficiency anemia 06/15/2017  . Neurofibromatosis ([place])      Vital Signs: BP (!) 96/58 (BP Location: Right Arm, Patient Position: Sitting, Cuff Size: Normal)   Pulse (!) 59   Resp 16   Ht 5\' 4"  (1.626 m)   Wt 160 lb (72.6  kg)   SpO2 98%   BMI 27.46 kg/m    Review of Systems  Constitutional: Positive for chills. Negative for activity change, fatigue and fever.  HENT: Positive for congestion, ear pain, postnasal drip, rhinorrhea, sinus pain, sore throat and voice change.   Eyes: Negative.   Respiratory: Negative for cough and wheezing.   Cardiovascular: Negative for chest pain and palpitations.  Gastrointestinal: Negative for constipation, diarrhea, nausea and vomiting.  Endocrine: Negative for cold intolerance, heat intolerance, polydipsia, polyphagia and polyuria.  Musculoskeletal: Positive for arthralgias and myalgias. Negative for back pain.  Skin: Negative for rash.  Allergic/Immunologic: Positive for environmental allergies.  Neurological: Positive for dizziness and headaches.  Hematological: Negative for adenopathy.  Psychiatric/Behavioral: Negative for agitation and dysphoric mood. The patient is not nervous/anxious.     Physical Exam  Constitutional: She is oriented to person, place, and time. She appears well-developed and well-nourished. No distress.  HENT:  Head: Normocephalic and atraumatic.  Right Ear: Tympanic membrane is erythematous and bulging.  Left Ear: Tympanic membrane is erythematous and bulging.  Nose: Rhinorrhea present.  Mouth/Throat: Oropharynx is clear and moist. No oropharyngeal exudate.  Eyes: Pupils are equal, round, and reactive to light. EOM are normal.  Neck: Normal range of motion. Neck supple. No JVD present. No tracheal deviation present. No thyromegaly present.  Cardiovascular: Normal rate, regular rhythm and normal heart sounds. Exam reveals no gallop and no friction rub.  No murmur heard. Pulmonary/Chest: Effort normal and breath sounds normal. No respiratory distress. She has no wheezes. She has no rales. She exhibits no tenderness.  Abdominal: Soft. Bowel sounds are normal. There is no tenderness.  Musculoskeletal: Normal range of motion.  Lymphadenopathy:     She has cervical adenopathy.  Neurological: She is alert and oriented to person, place, and time. No cranial nerve deficit.  Skin: Skin is warm and dry. She is not diaphoretic.  Psychiatric: She has a normal mood and affect. Her behavior is normal. Judgment and thought content normal.  Nursing note and vitals reviewed.  Assessment/Plan: 1. Acute non-recurrent maxillary sinusitis Headache most likely coming from sinusitis. Start z-pack. Take as directed for 5 days. Use OTC medication to alleviate symptoms.  - azithromycin (ZITHROMAX) 250 MG tablet; Take 2 tablets po on day one then take 1 tablet po days 2 through 10  Dispense: 11 tablet; Refill: 0  2. Vasomotor rhinitis - fluticasone (FLONASE) 50 MCG/ACT nasal spray; Place 2 sprays into both nostrils daily.  Dispense: 16 g; Refill: 6  General Counseling: Lesette verbalizes understanding of the findings of todays visit and agrees with plan of treatment. I have discussed any further diagnostic evaluation that may be needed or ordered today. We also reviewed her medications today. she has been encouraged to call the office with any questions or concerns that should arise related to todays visit.   This patient was seen by Leretha Pol, FNP- C in Collaboration with Dr [name] as a part of collaborative care agreement  Meds ordered this encounter  Medications  . azithromycin (ZITHROMAX) 250 MG tablet    Sig: Take 2 tablets po on day one then take 1 tablet po days 2 through 10    Dispense:  11 tablet    Refill:  0    Order Specific Question:   Supervising Provider    Answer:   [name] [place]  . fluticasone (FLONASE) 50 MCG/ACT nasal spray    Sig: Place 2 sprays into both nostrils daily.    Dispense:  16 g    Refill:  6    Order Specific Question:   Supervising Provider    Answer:   [name] [[id number]]    Time spent: 15 Minutes

## 2018-01-29 ENCOUNTER — Ambulatory Visit: Payer: BLUE CROSS/BLUE SHIELD | Attending: Urology

## 2018-01-29 DIAGNOSIS — M542 Cervicalgia: Secondary | ICD-10-CM | POA: Diagnosis not present

## 2018-01-29 DIAGNOSIS — M545 Low back pain: Secondary | ICD-10-CM | POA: Diagnosis not present

## 2018-01-29 DIAGNOSIS — G8929 Other chronic pain: Secondary | ICD-10-CM | POA: Diagnosis not present

## 2018-01-29 DIAGNOSIS — G894 Chronic pain syndrome: Secondary | ICD-10-CM | POA: Diagnosis not present

## 2018-01-29 DIAGNOSIS — F1120 Opioid dependence, uncomplicated: Secondary | ICD-10-CM | POA: Diagnosis not present

## 2018-01-29 DIAGNOSIS — M25552 Pain in left hip: Secondary | ICD-10-CM | POA: Diagnosis not present

## 2018-01-29 DIAGNOSIS — Z79891 Long term (current) use of opiate analgesic: Secondary | ICD-10-CM | POA: Diagnosis not present

## 2018-01-29 DIAGNOSIS — M25551 Pain in right hip: Secondary | ICD-10-CM | POA: Diagnosis not present

## 2018-02-05 ENCOUNTER — Ambulatory Visit: Payer: BLUE CROSS/BLUE SHIELD | Admitting: Urology

## 2018-02-05 ENCOUNTER — Telehealth: Payer: Self-pay | Admitting: Urology

## 2018-02-05 ENCOUNTER — Ambulatory Visit
Admission: RE | Admit: 2018-02-05 | Discharge: 2018-02-05 | Disposition: A | Discharge status: Home / Self Care | Payer: BLUE CROSS/BLUE SHIELD | Source: Ambulatory Visit | Attending: Urology | Admitting: Urology

## 2018-02-05 DIAGNOSIS — N281 Cyst of kidney, acquired: Secondary | ICD-10-CM | POA: Diagnosis not present

## 2018-02-05 DIAGNOSIS — Z85828 Personal history of other malignant neoplasm of skin: Secondary | ICD-10-CM | POA: Diagnosis not present

## 2018-02-05 DIAGNOSIS — Z1283 Encounter for screening for malignant neoplasm of skin: Secondary | ICD-10-CM | POA: Diagnosis not present

## 2018-02-05 DIAGNOSIS — D225 Melanocytic nevi of trunk: Secondary | ICD-10-CM | POA: Diagnosis not present

## 2018-02-05 DIAGNOSIS — D2371 Other benign neoplasm of skin of right lower limb, including hip: Secondary | ICD-10-CM | POA: Diagnosis not present

## 2018-02-05 DIAGNOSIS — N289 Disorder of kidney and ureter, unspecified: Secondary | ICD-10-CM | POA: Diagnosis not present

## 2018-02-05 DIAGNOSIS — D2372 Other benign neoplasm of skin of left lower limb, including hip: Secondary | ICD-10-CM | POA: Diagnosis not present

## 2018-02-05 DIAGNOSIS — D485 Neoplasm of uncertain behavior of skin: Secondary | ICD-10-CM | POA: Diagnosis not present

## 2018-02-05 DIAGNOSIS — D179 Benign lipomatous neoplasm, unspecified: Secondary | ICD-10-CM | POA: Diagnosis not present

## 2018-02-05 NOTE — Telephone Encounter (Signed)
I called pt and told her since she had RUS this morning, we wouldn't have results back this afternoon.  She doesn't want to reschedule at this time.  She said if they are cysts like she thought, it's not big deal unless RUS shows any changes or anything new.  Just F.Y.I.

## 2018-02-08 ENCOUNTER — Other Ambulatory Visit: Payer: Self-pay | Admitting: Family Medicine

## 2018-02-08 DIAGNOSIS — N281 Cyst of kidney, acquired: Secondary | ICD-10-CM

## 2018-02-08 NOTE — Telephone Encounter (Signed)
-----   Message from [name], MD sent at 02/08/2018 10:14 AM EDT ----- Please tell patient that the lesion on her left kidney is stable in size. However, it still needs to be monitored. Radiologist is recommending an MRI abdomen w and w/o contrast in 6 months. I think this a good idea. Can we get her set up for 6 month appt with MRI prior? thanks   ----- Message ----- From: Garnette Gunner, CMA Sent: 02/05/2018   2:59 PM To: [name], MD    ----- Message ----- From: Interface, Rad Results In Sent: 02/05/2018   2:11 PM To: [facility]

## 2018-02-08 NOTE — Telephone Encounter (Signed)
Patient notified of the results and will have MRI in 6 months.

## 2018-02-10 DIAGNOSIS — J300 Vasomotor rhinitis: Secondary | ICD-10-CM | POA: Insufficient documentation

## 2018-02-10 DIAGNOSIS — J0100 Acute maxillary sinusitis, unspecified: Secondary | ICD-10-CM | POA: Insufficient documentation

## 2018-02-12 ENCOUNTER — Other Ambulatory Visit: Payer: Self-pay | Admitting: Nurse Practitioner

## 2018-02-12 DIAGNOSIS — D509 Iron deficiency anemia, unspecified: Secondary | ICD-10-CM | POA: Diagnosis not present

## 2018-02-12 DIAGNOSIS — N920 Excessive and frequent menstruation with regular cycle: Secondary | ICD-10-CM | POA: Diagnosis not present

## 2018-02-12 DIAGNOSIS — E559 Vitamin D deficiency, unspecified: Secondary | ICD-10-CM | POA: Diagnosis not present

## 2018-02-12 DIAGNOSIS — Z0001 Encounter for general adult medical examination with abnormal findings: Secondary | ICD-10-CM | POA: Diagnosis not present

## 2018-02-13 LAB — LIPID PANEL W/O CHOL/HDL RATIO
CHOLESTEROL TOTAL: 207 mg/dL — AB (ref 100–199)
HDL: 68 mg/dL (ref >39)
LDL Calculated: 126 mg/dL — ABNORMAL HIGH (ref 0–99)
TRIGLYCERIDES: 65 mg/dL (ref 0–149)
VLDL Cholesterol Cal: 13 mg/dL (ref 5–40)

## 2018-02-13 LAB — COMPREHENSIVE METABOLIC PANEL
ALK PHOS: 84 IU/L (ref 39–117)
ALT: 34 IU/L — AB (ref 0–32)
AST: 30 IU/L (ref 0–40)
Albumin/Globulin Ratio: 2.1 (ref 1.2–2.2)
Albumin: 4.4 g/dL (ref 3.5–5.5)
BILIRUBIN TOTAL: 0.3 mg/dL (ref 0.0–1.2)
BUN / CREAT RATIO: 16 (ref 9–23)
BUN: 13 mg/dL (ref 6–24)
CO2: 27 mmol/L (ref 20–29)
Calcium: 9.2 mg/dL (ref 8.7–10.2)
Chloride: 102 mmol/L (ref 96–106)
Creatinine, Ser: 0.82 mg/dL (ref 0.57–1.00)
GFR calc Af Amer: 96 mL/min/{1.73_m2} (ref >59)
GFR calc non Af Amer: 83 mL/min/{1.73_m2} (ref >59)
GLUCOSE: 102 mg/dL — AB (ref 65–99)
Globulin, Total: 2.1 g/dL (ref 1.5–4.5)
POTASSIUM: 4.8 mmol/L (ref 3.5–5.2)
Sodium: 141 mmol/L (ref 134–144)
Total Protein: 6.5 g/dL (ref 6.0–8.5)

## 2018-02-13 LAB — B12 AND FOLATE PANEL
Folate: 4.3 ng/mL (ref >3.0)
VITAMIN B 12: 623 pg/mL (ref 232–1245)

## 2018-02-13 LAB — FSH/LH
FSH: 68.1 m[IU]/mL
LH: 49 m[IU]/mL

## 2018-02-13 LAB — IRON AND TIBC
Iron Saturation: 29 % (ref 15–55)
Iron: 74 ug/dL (ref 27–159)
TIBC: 252 ug/dL (ref 250–450)
UIBC: 178 ug/dL (ref 131–425)

## 2018-02-13 LAB — ESTRADIOL: ESTRADIOL: 79.5 pg/mL

## 2018-02-13 LAB — CBC
HEMOGLOBIN: 13.4 g/dL (ref 11.1–15.9)
Hematocrit: 39.2 % (ref 34.0–46.6)
MCH: 29.1 pg (ref 26.6–33.0)
MCHC: 34.2 g/dL (ref 31.5–35.7)
MCV: 85 fL (ref 79–97)
Platelets: 225 10*3/uL (ref 150–379)
RBC: 4.6 x10E6/uL (ref 3.77–5.28)
RDW: 12.2 % — AB (ref 12.3–15.4)
WBC: 4.7 10*3/uL (ref 3.4–10.8)

## 2018-02-13 LAB — VITAMIN D 25 HYDROXY (VIT D DEFICIENCY, FRACTURES): Vit D, 25-Hydroxy: 38.4 ng/mL (ref 30.0–100.0)

## 2018-02-13 LAB — TSH: TSH: 1.07 u[IU]/mL (ref 0.450–4.500)

## 2018-02-13 LAB — T4, FREE: FREE T4: 1.23 ng/dL (ref 0.82–1.77)

## 2018-02-13 LAB — FERRITIN: Ferritin: 34 ng/mL (ref 15–150)

## 2018-02-18 ENCOUNTER — Telehealth: Payer: Self-pay | Admitting: Nurse Practitioner

## 2018-02-18 NOTE — Telephone Encounter (Signed)
-----   Message from Ronnell Freshwater, NP sent at 02/17/2018  3:28 PM EDT ----- Please let the patient know that labs are back. Very mild elevated of bad and total cholesterol. She should limit intake of fried and fatty foods and exercise routinely. All other labs were good. thanks

## 2018-02-19 ENCOUNTER — Telehealth: Payer: Self-pay

## 2018-02-19 ENCOUNTER — Ambulatory Visit: Payer: BLUE CROSS/BLUE SHIELD

## 2018-02-19 DIAGNOSIS — G43719 Chronic migraine without aura, intractable, without status migrainosus: Secondary | ICD-10-CM | POA: Diagnosis not present

## 2018-02-19 DIAGNOSIS — N920 Excessive and frequent menstruation with regular cycle: Secondary | ICD-10-CM

## 2018-02-19 DIAGNOSIS — Q8500 Neurofibromatosis, unspecified: Secondary | ICD-10-CM | POA: Diagnosis not present

## 2018-02-19 NOTE — Telephone Encounter (Signed)
Pt came in for appt with [place] and requested a copy of labs.  We gave pt copy of labs and told her directions for her elevated levels.  dbs

## 2018-02-23 ENCOUNTER — Other Ambulatory Visit: Payer: Self-pay | Admitting: Neurology

## 2018-02-23 DIAGNOSIS — G43719 Chronic migraine without aura, intractable, without status migrainosus: Secondary | ICD-10-CM

## 2018-02-23 NOTE — Progress Notes (Signed)
Pt was notified. Will send message to [facility] for referral.

## 2018-02-24 ENCOUNTER — Other Ambulatory Visit: Payer: Self-pay | Admitting: Oncology

## 2018-02-24 ENCOUNTER — Telehealth: Payer: Self-pay | Admitting: *Deleted

## 2018-02-24 DIAGNOSIS — D509 Iron deficiency anemia, unspecified: Secondary | ICD-10-CM

## 2018-02-24 NOTE — Telephone Encounter (Signed)
Per conversation with Dr Tasia Catchings and Ellison Hughs -  Since patient just had labs drawn recently, only Md visit is needed, one day next week will be okay.

## 2018-02-24 NOTE — Telephone Encounter (Signed)
Patient is scheduled for MD Only Per Almyra Free "Verbal"  Appt was scheduled as requested.  Patient is scheduled for 03/05/18. Per patient request to come on 03/05/18

## 2018-02-24 NOTE — Telephone Encounter (Signed)
Patient left message with answering service that she needs an appointment. She has not seen physician since October and has no follow up appts scheduled. She reports that her counts are dropping and she needs to be seen. Please advise/ send message to scheduling if you want to schedule appointment to see her.

## 2018-03-02 ENCOUNTER — Other Ambulatory Visit: Payer: Self-pay | Admitting: Neurology

## 2018-03-02 DIAGNOSIS — G43719 Chronic migraine without aura, intractable, without status migrainosus: Secondary | ICD-10-CM

## 2018-03-03 DIAGNOSIS — G43719 Chronic migraine without aura, intractable, without status migrainosus: Secondary | ICD-10-CM | POA: Insufficient documentation

## 2018-03-04 ENCOUNTER — Ambulatory Visit: Payer: BLUE CROSS/BLUE SHIELD

## 2018-03-05 ENCOUNTER — Inpatient Hospital Stay: Payer: BLUE CROSS/BLUE SHIELD | Attending: Oncology | Admitting: Oncology

## 2018-03-05 ENCOUNTER — Other Ambulatory Visit: Payer: Self-pay

## 2018-03-05 ENCOUNTER — Encounter: Payer: Self-pay | Admitting: Oncology

## 2018-03-05 VITALS — BP 105/68 | HR 62 | Temp 96.6°F | Wt 163.0 lb

## 2018-03-05 DIAGNOSIS — D508 Other iron deficiency anemias: Secondary | ICD-10-CM

## 2018-03-05 DIAGNOSIS — L989 Disorder of the skin and subcutaneous tissue, unspecified: Secondary | ICD-10-CM

## 2018-03-05 DIAGNOSIS — M1990 Unspecified osteoarthritis, unspecified site: Secondary | ICD-10-CM

## 2018-03-05 DIAGNOSIS — Z79899 Other long term (current) drug therapy: Secondary | ICD-10-CM

## 2018-03-05 DIAGNOSIS — K5900 Constipation, unspecified: Secondary | ICD-10-CM

## 2018-03-05 DIAGNOSIS — Q8500 Neurofibromatosis, unspecified: Secondary | ICD-10-CM | POA: Diagnosis not present

## 2018-03-05 DIAGNOSIS — G8929 Other chronic pain: Secondary | ICD-10-CM

## 2018-03-05 DIAGNOSIS — D509 Iron deficiency anemia, unspecified: Secondary | ICD-10-CM | POA: Diagnosis not present

## 2018-03-05 DIAGNOSIS — K219 Gastro-esophageal reflux disease without esophagitis: Secondary | ICD-10-CM

## 2018-03-05 NOTE — Progress Notes (Signed)
Patient here today for follow up.   

## 2018-03-06 NOTE — Progress Notes (Signed)
Hematology/Oncology Follow up Note [facility] [phone] [phone]  CONSULT NOTE Patient Care Team: [name], MD as PCP - General (Internal Medicine)  Referring Physician: Wyatt Mage Latricia Heft  CHIEF COMPLAINTS/REASON FOR VISIT  Follow up for treatment of anemia     HISTORY OF PRESENTING ILLNESS:  Andrea Bernard 52 y.o.  female with past medical history listed presents for follow up for anemia treatment and genetic tests.  Her medical history was reviewed and listed as below. 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 She reports that some of her children, her father and other family members on the father's side also have the subcutaneous lesions. It was looked for evidence of NF2 and MRI was negative for acoustic neuroma, She used to follow up with [facility] neurology and genetic test of NF1/SPRED was ordered and patient did not get it done.  Patient recently was seen [facility] in clinic Dr.Lykins, Joelene Millin for acute pharyngitis was found to have anemia. She was told to follow up with her primary care provider Dr.Khan and was referred to me. Patient reports prfound fatigue. Denies weight loss, blood in stool. She is always constipated as she is on OXycontin 10m BID for chronic pain caused by her multiple lesions. She has an elective procedure next week for excision of her forearm lesions.    She is s/p IV Venofer 2073mweekly x 4 doses. She feels that her energy level has slightly improved but not much. During the interval she has had additional mass resected 06/23/2017 and has had sinus infection. She feels that she has not fully recovered from her infection. Husband presents in the clinic.  Genetic VUS of BRAC2 and NF1 results were discussed with patient and I will refer her to talk to genetic counselor  for additional information to see if her breast cancer screen should be affected.    INTERVAL HISTORY Andrea SULTONs a [age].o. female who has above history reviewed by me today presents for follow up visit for management of anemia.  During the interval she has had colonoscopy on 09/04/2017  which showed normal colon. EGD on 09/04/2017 showed gastritis and gastric polyps  She follows up with neurology for management of NF. Lately she developed worsening of headache and has been schedued to have brain MRI.  Overall feels good. Not much fatigue.   Review of Systems  Constitutional: Negative for chills, fever, malaise/fatigue and weight loss.  HENT: Negative for congestion, ear discharge, ear pain, nosebleeds, sinus pain and sore throat.   Eyes: Negative for double vision, photophobia, pain, discharge and redness.  Respiratory: Negative for cough, hemoptysis, sputum production, shortness of breath and wheezing.   Cardiovascular: Negative for chest pain, palpitations, orthopnea, claudication and leg swelling.  Gastrointestinal: Negative for abdominal pain, blood in stool, constipation, diarrhea, heartburn, melena, nausea and vomiting.  Genitourinary: Negative for dysuria, flank pain, frequency and hematuria.  Musculoskeletal: Negative for back pain, myalgias and neck pain.  Skin: Negative for itching and rash.  Neurological: Negative for dizziness, tingling, tremors, focal weakness, weakness and headaches.  Endo/Heme/Allergies: Negative for environmental allergies. Does not bruise/bleed easily.  Psychiatric/Behavioral: Negative for depression and hallucinations. The patient is not nervous/anxious.      MEDICAL HISTORY:  Past Medical History:  Diagnosis Date  . Arthritis   . GERD (gastroesophageal reflux disease)   . Headache   .  Iron deficiency anemia 06/15/2017  . Neurofibromatosis [facility])     SURGICAL HISTORY: Past Surgical History:  Procedure Laterality Date  . carpel tunnel  release    . CESAREAN SECTION     x 4  . COLONOSCOPY WITH PROPOFOL N/A 09/04/2017   Procedure: COLONOSCOPY WITH PROPOFOL;  Surgeon: [name], MD;  Location: [facility] ENDOSCOPY;  Service: Gastroenterology;  Laterality: N/A;  . COLONOSCOPY WITH PROPOFOL N/A 12/21/2017   Procedure: COLONOSCOPY WITH PROPOFOL;  Surgeon: [name], MD;  Location: [facility] ENDOSCOPY;  Service: Gastroenterology;  Laterality: N/A;  . ESOPHAGOGASTRODUODENOSCOPY (EGD) WITH PROPOFOL N/A 09/04/2017   Procedure: ESOPHAGOGASTRODUODENOSCOPY (EGD) WITH PROPOFOL;  Surgeon: [name], MD;  Location: [facility] ENDOSCOPY;  Service: Gastroenterology;  Laterality: N/A;  . HERNIA REPAIR    . TUBAL LIGATION    . TUMOR REMOVAL      SOCIAL HISTORY: Social History   Socioeconomic History  . Marital status: Married    Spouse name: Not on file  . Number of children: Not on file  . Years of education: Not on file  . Highest education level: Not on file  Occupational History  . Not on file  Social Needs  . Financial resource strain: Not on file  . Food insecurity:    Worry: Not on file    Inability: Not on file  . Transportation needs:    Medical: Not on file    Non-medical: Not on file  Tobacco Use  . Smoking status: Never Smoker  . Smokeless tobacco: Never Used  Substance and Sexual Activity  . Alcohol use: No  . Drug use: Yes    Types: Other-see comments    Comment: Oxycotin  . Sexual activity: Yes    Birth control/protection: Surgical  Lifestyle  . Physical activity:    Days per week: Not on file    Minutes per session: Not on file  . Stress: Not on file  Relationships  . Social connections:    Talks on phone: Not on file    Gets together: Not on file    Attends religious service: Not on file    Active member of club or organization: Not on file    Attends meetings of clubs or organizations: Not on file    Relationship status: Not on file  . Intimate partner violence:    Fear of current or ex partner: Not on  file    Emotionally abused: Not on file    Physically abused: Not on file    Forced sexual activity: Not on file  Other Topics Concern  . Not on file  Social History Narrative  . Not on file    FAMILY HISTORY: Family History  Problem Relation Age of Onset  . Anemia Mother   . Bladder Cancer Neg Hx   . Kidney cancer Neg Hx   . Breast cancer Neg Hx     ALLERGIES:  is allergic to nitrofuran derivatives.  MEDICATIONS:  Current Outpatient Medications  Medication Sig Dispense Refill  . acetaminophen (TYLENOL) 325 MG tablet Take 650 mg by mouth.    . famotidine (PEPCID) 20 MG tablet Take 20 mg by mouth daily.  3  . gabapentin (NEURONTIN) 300 MG capsule Take 300 mg by mouth 3 (three) times daily.  0  . OXYCONTIN 80 MG 12 hr tablet Take 80 mg by mouth every 12 (twelve) hours.  0  . pantoprazole (PROTONIX) 40 MG tablet TAKE 1 TABLET BY MOUTH TWICE A DAY 60 tablet 6  . SUMAtriptan (  IMITREX) 100 MG tablet Take 100 mg by mouth daily as needed.     No current facility-administered medications for this visit.    Facility-Administered Medications Ordered in Other Visits  Medication Dose Route Frequency Provider Last Rate Last Dose  . iron sucrose (VENOFER) 200 mg IVPB  200 mg Intravenous Weekly Earlie Server, MD      . sodium chloride flush (NS) 0.9 % injection 10 mL  10 mL Intracatheter PRN Earlie Server, MD          .  PHYSICAL EXAMINATION: ECOG PERFORMANCE STATUS: 0 - Asymptomatic Vitals:   03/05/18 1513  BP: 105/68  Pulse: 62  Temp: (!) 96.6 F (35.9 C)   Filed Weights   03/05/18 1513  Weight: 163 lb (73.9 kg)   Physical Exam  Constitutional: She is oriented to person, place, and time and well-developed, well-nourished, and in no distress. No distress.  HENT:  Head: Normocephalic and atraumatic.  Nose: Nose normal.  Mouth/Throat: Oropharynx is clear and moist. No oropharyngeal exudate.  Eyes: Pupils are equal, round, and reactive to light. EOM are normal. Left eye exhibits no  discharge. No scleral icterus.  Neck: Normal range of motion. Neck supple. No JVD present.  Cardiovascular: Normal rate, regular rhythm and normal heart sounds.  No murmur heard. Pulmonary/Chest: Effort normal and breath sounds normal. No respiratory distress. She has no wheezes. She has no rales. She exhibits no tenderness.  Abdominal: Soft. She exhibits no distension and no mass. There is no tenderness. There is no rebound.  Musculoskeletal: Normal range of motion. She exhibits no edema or tenderness.  Lymphadenopathy:    She has no cervical adenopathy.  Neurological: She is alert and oriented to person, place, and time. No cranial nerve deficit. She exhibits normal muscle tone. Coordination normal.  Skin: Skin is warm and dry. She is not diaphoretic. No erythema.  Psychiatric: Affect and judgment normal.   LABORATORY DATA:  I have reviewed the data as listed Lab Results  Component Value Date   WBC 4.7 02/12/2018   HGB 13.4 02/12/2018   HCT 39.2 02/12/2018   MCV 85 02/12/2018   PLT 225 02/12/2018   Iron/TIBC/Ferritin/ %Sat    Component Value Date/Time   IRON 74 02/12/2018 1006   TIBC 252 02/12/2018 1006   FERRITIN 34 02/12/2018 1006   IRONPCTSAT 29 02/12/2018 1006   Recent Labs    06/15/17 0945 10/29/17 1624 01/05/18 0908 02/12/18 1006  NA 137 139 142 141  K 3.9 4.3 4.6 4.8  CL 103 100 101 102  CO2 27 27  --  27  GLUCOSE 106* 106* 89 102*  BUN _0 CREATININE 0.76 0.85 0.77 0.82  CALCIUM 8.8* 9.0 9.1 9.2  GFRNONAA >60 80 90 83  GFRAA >60 92 103 96  PROT 6.9 6.6 6.3 6.5  ALBUMIN 3.7 4.2 4.2 4.4  AST 24 21 35 30  ALT 12* 18  --  34*  ALKPHOS 64 60 77 84  BILITOT 0.3 0.4 0.2 0.3   Iron/TIBC/Ferritin/ %Sat    Component Value Date/Time   IRON 74 02/12/2018 1006   TIBC 252 02/12/2018 1006   FERRITIN 34 02/12/2018 1006   IRONPCTSAT 29 02/12/2018 1006    INVITAE genetic test results (sample collected on9/18/2018)  Variants of uncertain Significance  identified in BRCA 2 and NF1  RADIOGRAPHIC STUDIES: I have personally reviewed the radiological images as listed and agreed with the findings in the report. 07/03/2017 Mammogram screening negative.  ASSESSMENT & PLAN:  1. Iron deficiency anemia, unspecified iron deficiency anemia type   2. Other iron deficiency anemia   3. History of neurofibromatosis ([place])   4. Microcytic anemia    # Iron panel was reviewed with patient.  Her ferritin level is 34, lower than level in January. TSAT is 29. Will hold additional IV venofer today.  Advise patient to eat healthy and balanced diet.   # Her genetic testing results were discussed with her. I have referred her to genetic couselor and she did not go. Offered her to refer again she feels that there is too much going on for now and she would defer it.   # NF: She was referred to Dr.Shah and encourage patient to continue follow up with him. .  All questions were answered. The patient knows to call the clinic with any problems questions or concerns.  Return of visit:  6 months with repeat cbc, iron tibc done a few days earlier.     Earlie Server, MD, PhD Hematology Oncology [facility] at [facility] Pager- [phone]

## 2018-03-12 DIAGNOSIS — Q8500 Neurofibromatosis, unspecified: Secondary | ICD-10-CM | POA: Diagnosis not present

## 2018-03-12 DIAGNOSIS — J019 Acute sinusitis, unspecified: Secondary | ICD-10-CM | POA: Diagnosis not present

## 2018-03-12 DIAGNOSIS — R51 Headache: Secondary | ICD-10-CM | POA: Diagnosis not present

## 2018-03-12 DIAGNOSIS — N951 Menopausal and female climacteric states: Secondary | ICD-10-CM | POA: Diagnosis not present

## 2018-03-13 ENCOUNTER — Ambulatory Visit
Admission: RE | Admit: 2018-03-13 | Discharge: 2018-03-13 | Disposition: A | Discharge status: Home / Self Care | Payer: BLUE CROSS/BLUE SHIELD | Source: Ambulatory Visit | Attending: Neurology | Admitting: Neurology

## 2018-03-13 DIAGNOSIS — H4710 Unspecified papilledema: Secondary | ICD-10-CM | POA: Diagnosis not present

## 2018-03-13 DIAGNOSIS — G43719 Chronic migraine without aura, intractable, without status migrainosus: Secondary | ICD-10-CM

## 2018-03-13 DIAGNOSIS — Q8501 Neurofibromatosis, type 1: Secondary | ICD-10-CM | POA: Insufficient documentation

## 2018-03-13 DIAGNOSIS — R51 Headache: Secondary | ICD-10-CM | POA: Diagnosis not present

## 2018-03-13 MED ORDER — GADOBENATE DIMEGLUMINE 529 MG/ML IV SOLN
15.0000 mL | Freq: Once | INTRAVENOUS | Status: AC | PRN
  Administered 2018-03-13: 15 mL via INTRAVENOUS

## 2018-03-19 ENCOUNTER — Ambulatory Visit: Payer: BLUE CROSS/BLUE SHIELD | Admitting: Obstetrics and Gynecology

## 2018-03-19 ENCOUNTER — Encounter: Payer: Self-pay | Admitting: Obstetrics and Gynecology

## 2018-03-19 VITALS — BP 104/66 | HR 57 | Ht 64.0 in | Wt 161.8 lb

## 2018-03-19 DIAGNOSIS — N951 Menopausal and female climacteric states: Secondary | ICD-10-CM

## 2018-03-19 DIAGNOSIS — Z79890 Hormone replacement therapy (postmenopausal): Secondary | ICD-10-CM

## 2018-03-19 MED ORDER — ESTRADIOL-NORETHINDRONE ACET 1-0.5 MG PO TABS: 1.0000 | ORAL_TABLET | Freq: Every day | ORAL | 2 refills | Status: DC

## 2018-03-19 NOTE — Progress Notes (Signed)
HPI:      [name] is a 52 y.o. [id number] who LMP was Patient's last menstrual period was 12/18/2017.  Subjective:   She presents today to discuss her most recent [facility] LH and ultrasound findings as well as to review the work-up and possible solution to her headache.  She has had a headache for several weeks and because of her diagnosis of neurofibromatosis has undergone multiple tests including MRI which have proved negative. She reports that she has not had a menstrual period in 3 months and prior to that had very regular monthly cycles.  She has a tubal ligation for birth control.    Hx: The following portions of the patient's history were reviewed and updated as appropriate:             She  has a past medical history of Arthritis, GERD (gastroesophageal reflux disease), Headache, Iron deficiency anemia (06/15/2017), and Neurofibromatosis ([place]). She does not have any pertinent problems on file. She  has a past surgical history that includes Hernia repair; Tubal ligation; Tumor removal; Colonoscopy with propofol (N/A, 09/04/2017); Esophagogastroduodenoscopy (egd) with propofol (N/A, 09/04/2017); carpel tunnel release; Colonoscopy with propofol (N/A, 12/21/2017); and Cesarean section. Her family history includes Anemia in her mother. She  reports that she has never smoked. She has never used smokeless tobacco. She reports that she has current or past drug history. Drug: Other-see comments. She reports that she does not drink alcohol. She has a current medication list which includes the following prescription(s): acetaminophen, famotidine, gabapentin, oxycontin, pantoprazole, sumatriptan, and estradiol-norethindrone, and the following Facility-Administered Medications: iron sucrose (VENOFER) 200 mg IVPB and sodium chloride flush. She is allergic to nitrofuran derivatives.       Review of Systems:  Review of Systems  Constitutional: Denied constitutional symptoms, night sweats, recent  illness, fatigue, fever, insomnia and weight loss.  Eyes: Denied eye symptoms, eye pain, photophobia, vision change and visual disturbance.  Ears/Nose/Throat/Neck: Denied ear, nose, throat or neck symptoms, hearing loss, nasal discharge, sinus congestion and sore throat.  Cardiovascular: Denied cardiovascular symptoms, arrhythmia, chest pain/pressure, edema, exercise intolerance, orthopnea and palpitations.  Respiratory: Denied pulmonary symptoms, asthma, pleuritic pain, productive sputum, cough, dyspnea and wheezing.  Gastrointestinal: Denied, gastro-esophageal reflux, melena, nausea and vomiting.  Genitourinary:. Denied genitourinary symptoms including symptomatic vaginal discharge, pelvic relaxation issues, and urinary complaints.  Musculoskeletal: Denied musculoskeletal symptoms, stiffness, swelling, muscle weakness and myalgia.  Dermatologic: Denied dermatology symptoms, rash and scar.  Neurologic: Denied neurology symptoms, dizziness, headache, neck pain and syncope.  Psychiatric: Denied psychiatric symptoms, anxiety and depression.  Endocrine: Denied endocrine symptoms including hot flashes and night sweats.   Meds:   Current Outpatient Medications on File Prior to Visit  Medication Sig Dispense Refill  . acetaminophen (TYLENOL) 325 MG tablet Take 650 mg by mouth.    . famotidine (PEPCID) 20 MG tablet Take 20 mg by mouth daily.  3  . gabapentin (NEURONTIN) 300 MG capsule Take 300 mg by mouth 3 (three) times daily.  0  . OXYCONTIN 80 MG 12 hr tablet Take 80 mg by mouth every 12 (twelve) hours.  0  . pantoprazole (PROTONIX) 40 MG tablet TAKE 1 TABLET BY MOUTH TWICE A DAY 60 tablet 6  . SUMAtriptan (IMITREX) 100 MG tablet Take 100 mg by mouth daily as needed.     Current Facility-Administered Medications on File Prior to Visit  Medication Dose Route Frequency Provider Last Rate Last Dose  . iron sucrose (VENOFER) 200 mg IVPB  200  mg Intravenous Weekly Earlie Server, MD      . sodium  chloride flush (NS) 0.9 % injection 10 mL  10 mL Intracatheter PRN Earlie Server, MD        Objective:     Vitals:   03/19/18 0802  BP: 104/66  Pulse: (!) 57              Ultrasound results and multiple labs reviewed in detail with the patient.  We specifically discussed her estradiol FSH/LH.  Assessment:    [id number] Patient Active Problem List   Diagnosis Date Noted  . Acute non-recurrent maxillary sinusitis 02/10/2018  . Vasomotor rhinitis 02/10/2018  . Chronic pain syndrome 01/05/2018  . Long term current use of opiate analgesic 01/05/2018  . Pharmacologic therapy 01/05/2018  . Disorder of skeletal system 01/05/2018  . Problems influencing health status 01/05/2018  . Chronic neck pain (Primary Area of Pain)(right) 01/05/2018  . Chronic pain of both hips  [facility] Area of Pain) (L>R) 01/05/2018  . Chronic bilateral low back pain without sciatica  (Fourth Area of Pain)) 01/05/2018  . Chronic pain of right upper extremity (Secondary Area of Pain) 01/05/2018  . Encounter for general adult medical examination with abnormal findings 12/27/2017  . Abnormal weight gain 12/27/2017  . Menorrhagia with regular cycle 12/27/2017  . Dysuria 12/27/2017  . Urinary tract infectious disease 10/27/2017  . Iron deficiency anemia 06/15/2017  . History of neuroblastoma 06/15/2017  . Chronic pain 06/15/2017  . Angiolipomatosis, familial 02/26/2017  . Lipoma of left upper extremity 02/25/2017  . Multiple lipomas 02/25/2017  . Type 1 neurofibromatosis ([place]) 07/17/2015  . Optic glioma ([place]) 07/13/2015  . Pain of multiple extremities 07/13/2015  . 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 Symptomatic menopausal or female climacteric states   2. Postmenopausal hormone therapy     Patient is in menopause based on [facility] findings.  It is not uncommon to have migraines in early menopause because of hormone changes.  This is similar to what some patients experience as menstrual migraines.  Hers being  around her right eye is somewhat atypical.   Plan:            1.  HRT I have discussed HRT with the patient in detail.  The risk/benefits of it were reviewed.  She understands that during menopause Estrogen decreases dramatically and that this results in an increased risk of cardiovascular disease as well as osteoporosis.  We have also discussed the fact that hot flashes often result from a decrease in Estrogen, and that by replacing Estrogen, they can often be alleviated.  We have discussed skin, vaginal and urinary tract changes that may also take place from this drop in Estrogen.  Emotional changes have also been linked to Estrogen and we have briefly discussed this.  The benefits of HRT including decrease in hot flashes, vaginal dryness, and osteoporosis were discussed.  The emotional benefit and a possible change in her cardiovascular risk profile was also reviewed.  The risks associated with Hormone Replacement Therapy were also reviewed.  The use of unopposed Estrogen and its relationship to endometrial cancer was discussed.  The addition of Progesterone and its beneficial effect on endometrial cancer was also noted.  The fact that there has been no consistent definitive studies showing an increase in breast cancer in women who use HRT was discussed with the patient.  The possible side effects including breast tenderness, fluid retention, mood changes and vaginal bleeding were discussed.  The patient  was informed that this is an elective medication and that she may choose not to take Hormone Replacement Therapy.  Literature on HRT was given, and I believe that after answering all of the patient's questions, she has an adequate and informed understanding of HRT.  Special emphasis on the WHI study, as well as several studies since that pertaining to the risks and benefits of estrogen replacement therapy were compared.  The possible limitations of these studies were discussed including the age  stratification of the WHI study.  The possible role of Progesterone in these studies was discussed in detail.  I believe that the patient has an informed knowledge of the risks and benefits of HRT.  I have specifically discussed WHI findings and current updates.  Different type of hormone formulation and methods of taking hormone replacement therapy discussed.  I think it is entirely reasonable to begin her on HRT in an attempt to prevent osteoporosis (her mother had significant osteoporotic disease) and as a trial to see if this affects her daily headaches.  We have discussed this in detail. Plan to begin Activella and adjust dosing as necessary. 2.  Possible uterine fibroid    there is nothing further to do about this as fibroids are known to shrink and become much less symptomatic when in menopause.  The patient did not have significant cycle related bleeding even premenopausally. 3.  9 mm endometrium   this is somewhat thickened for someone in menopause but she is having no postmenopausal bleeding and is newly menopausal.  I believe that this will shrink over time especially on HRT.  A follow-up ultrasound in 3 months is warranted to re-evaluate.  Orders No orders of the defined types were placed in this encounter.    Meds ordered this encounter  Medications  . estradiol-norethindrone (ACTIVELLA) 1-0.5 MG tablet    Sig: Take 1 tablet by mouth daily.    Dispense:  30 tablet    Refill:  2      F/U  Return in about 3 months (around 06/19/2018). I spent 32 minutes involved in the care of this patient of which greater than 50% was spent discussing headaches, neurofibromatosis, effect of hormones on headache, uterine fibroids, natural course and history of uterine fibroids, endometrial hyperplasia and prelude to cancer, possible follow-up for endometrial thickening, hormone replacement therapy risks and benefits.  Finis Bud, M.D. 03/19/2018 9:47 AM

## 2018-03-26 DIAGNOSIS — M545 Low back pain: Secondary | ICD-10-CM | POA: Diagnosis not present

## 2018-03-26 DIAGNOSIS — Z79891 Long term (current) use of opiate analgesic: Secondary | ICD-10-CM | POA: Diagnosis not present

## 2018-03-26 DIAGNOSIS — M25552 Pain in left hip: Secondary | ICD-10-CM | POA: Diagnosis not present

## 2018-03-26 DIAGNOSIS — M542 Cervicalgia: Secondary | ICD-10-CM | POA: Diagnosis not present

## 2018-03-26 DIAGNOSIS — G8929 Other chronic pain: Secondary | ICD-10-CM | POA: Diagnosis not present

## 2018-03-26 DIAGNOSIS — G894 Chronic pain syndrome: Secondary | ICD-10-CM | POA: Diagnosis not present

## 2018-03-26 DIAGNOSIS — F1120 Opioid dependence, uncomplicated: Secondary | ICD-10-CM | POA: Diagnosis not present

## 2018-03-27 DIAGNOSIS — Q8500 Neurofibromatosis, unspecified: Secondary | ICD-10-CM | POA: Insufficient documentation

## 2018-05-03 ENCOUNTER — Other Ambulatory Visit: Payer: Self-pay | Admitting: Internal Medicine

## 2018-05-07 DIAGNOSIS — G894 Chronic pain syndrome: Secondary | ICD-10-CM | POA: Diagnosis not present

## 2018-05-07 DIAGNOSIS — M25552 Pain in left hip: Secondary | ICD-10-CM | POA: Diagnosis not present

## 2018-05-07 DIAGNOSIS — M542 Cervicalgia: Secondary | ICD-10-CM | POA: Diagnosis not present

## 2018-05-07 DIAGNOSIS — G8929 Other chronic pain: Secondary | ICD-10-CM | POA: Diagnosis not present

## 2018-05-07 DIAGNOSIS — M545 Low back pain: Secondary | ICD-10-CM | POA: Diagnosis not present

## 2018-05-07 DIAGNOSIS — F1120 Opioid dependence, uncomplicated: Secondary | ICD-10-CM | POA: Diagnosis not present

## 2018-05-07 DIAGNOSIS — Z79891 Long term (current) use of opiate analgesic: Secondary | ICD-10-CM | POA: Diagnosis not present

## 2018-05-07 DIAGNOSIS — L908 Other atrophic disorders of skin: Secondary | ICD-10-CM | POA: Diagnosis not present

## 2018-06-18 DIAGNOSIS — Z79891 Long term (current) use of opiate analgesic: Secondary | ICD-10-CM | POA: Diagnosis not present

## 2018-06-18 DIAGNOSIS — M25552 Pain in left hip: Secondary | ICD-10-CM | POA: Diagnosis not present

## 2018-06-18 DIAGNOSIS — G8929 Other chronic pain: Secondary | ICD-10-CM | POA: Diagnosis not present

## 2018-06-18 DIAGNOSIS — G894 Chronic pain syndrome: Secondary | ICD-10-CM | POA: Diagnosis not present

## 2018-06-18 DIAGNOSIS — F1120 Opioid dependence, uncomplicated: Secondary | ICD-10-CM | POA: Diagnosis not present

## 2018-06-18 DIAGNOSIS — M545 Low back pain: Secondary | ICD-10-CM | POA: Diagnosis not present

## 2018-06-18 DIAGNOSIS — M542 Cervicalgia: Secondary | ICD-10-CM | POA: Diagnosis not present

## 2018-07-30 DIAGNOSIS — M542 Cervicalgia: Secondary | ICD-10-CM | POA: Diagnosis not present

## 2018-07-30 DIAGNOSIS — G894 Chronic pain syndrome: Secondary | ICD-10-CM | POA: Diagnosis not present

## 2018-07-30 DIAGNOSIS — M25552 Pain in left hip: Secondary | ICD-10-CM | POA: Diagnosis not present

## 2018-07-30 DIAGNOSIS — F1120 Opioid dependence, uncomplicated: Secondary | ICD-10-CM | POA: Diagnosis not present

## 2018-07-30 DIAGNOSIS — Z79891 Long term (current) use of opiate analgesic: Secondary | ICD-10-CM | POA: Diagnosis not present

## 2018-07-30 DIAGNOSIS — M545 Low back pain: Secondary | ICD-10-CM | POA: Diagnosis not present

## 2018-07-30 DIAGNOSIS — G8929 Other chronic pain: Secondary | ICD-10-CM | POA: Diagnosis not present

## 2018-08-27 ENCOUNTER — Ambulatory Visit: Payer: Self-pay | Admitting: Nurse Practitioner

## 2018-09-10 ENCOUNTER — Ambulatory Visit: Payer: BLUE CROSS/BLUE SHIELD | Admitting: Nurse Practitioner

## 2018-09-10 ENCOUNTER — Encounter: Payer: Self-pay | Admitting: Nurse Practitioner

## 2018-09-10 VITALS — BP 116/74 | HR 68 | Temp 97.9°F | Resp 16 | Ht 65.0 in | Wt 160.0 lb

## 2018-09-10 DIAGNOSIS — N959 Unspecified menopausal and perimenopausal disorder: Secondary | ICD-10-CM

## 2018-09-10 DIAGNOSIS — R5383 Other fatigue: Secondary | ICD-10-CM | POA: Diagnosis not present

## 2018-09-10 DIAGNOSIS — Q8501 Neurofibromatosis, type 1: Secondary | ICD-10-CM | POA: Diagnosis not present

## 2018-09-10 NOTE — Progress Notes (Signed)
[facility] [place],  [id number]  Internal MEDICINE  Office Visit Note  Patient Name: Andrea Bernard  [id number]  [id number]  Date of Service: 09/15/2018  Chief Complaint  Patient presents with  . Rash    pt complaining of salty skin when she licks her lips it turns her mouth inside out.  and have a mass on her spine. been going on for about 3 months, pt states it hurting wants to make sure that its not going through her spine.   . Fever    has been running low grade fever for about 6wks now, taking OTC medication when taking the medication she breaks out into a sweat     The patient is here for sick visit. She has long history of neurofibromatosis. She has noted new growth of neurofibromas along the lower portion of her back. They are tender. They are getting more numerous and larger with time. She has also noted a very salty taste to her skin and lips. Feels like this started when she stopped having menstrual cycles. Unsure if this is related to something metabolic or if it is also related to NF. She does not see a specialist for her neurofibromatosis.   .     Current Medication:  Outpatient Encounter Medications as of 09/10/2018  Medication Sig  . acetaminophen (TYLENOL) 325 MG tablet Take 650 mg by mouth.  . famotidine (PEPCID) 20 MG tablet TAKE 1 TABLET BY MOUTH EVERY DAY  . gabapentin (NEURONTIN) 300 MG capsule Take 300 mg by mouth 3 (three) times daily.  . OXYCONTIN 80 MG 12 hr tablet Take 80 mg by mouth every 12 (twelve) hours.  . pantoprazole (PROTONIX) 40 MG tablet TAKE 1 TABLET BY MOUTH TWICE A DAY  . SUMAtriptan (IMITREX) 100 MG tablet Take 100 mg by mouth daily as needed.  [name] estradiol-norethindrone (ACTIVELLA) 1-0.5 MG tablet Take 1 tablet by mouth daily.   Facility-Administered Encounter Medications as of 09/10/2018  Medication  . iron sucrose (VENOFER) 200 mg IVPB  . sodium chloride flush (NS) 0.9 % injection 10 mL       Medical History: Past Medical History:  Diagnosis Date  . Arthritis   . GERD (gastroesophageal reflux disease)   . Headache   . Iron deficiency anemia 06/15/2017  . Neurofibromatosis ([place])      Today's Vitals   09/10/18 1532  BP: 116/74  Pulse: 68  Resp: 16  Temp: 97.9 F (36.6 C)  SpO2: 95%  Weight: 160 lb (72.6 kg)  Height: 5\' 5"  (1.651 m)    Review of Systems  Constitutional: Positive for fatigue. Negative for activity change, chills, diaphoresis and unexpected weight change.       Salty taste to her skin and lips over past few weeks.   HENT: Negative for congestion, postnasal drip, rhinorrhea, sneezing and sore throat.   Eyes: Negative for redness.  Respiratory: Negative for cough, chest tightness and shortness of breath.   Cardiovascular: Negative for chest pain and palpitations.  Gastrointestinal: Negative for abdominal pain, constipation, diarrhea, nausea and vomiting.  Endocrine: Positive for heat intolerance. Negative for polydipsia and polyuria.  Musculoskeletal: Positive for back pain. Negative for arthralgias, joint swelling and neck pain.  Skin: Negative for rash.       New growths under the skin of right lower back, stretching from the spine to the right flank area.   Allergic/Immunologic: Positive for environmental allergies.  Neurological: Positive for headaches. Negative for tremors and  numbness.  Hematological: Negative for adenopathy. Does not bruise/bleed easily.  Psychiatric/Behavioral: Negative for behavioral problems (Depression), sleep disturbance and suicidal ideas. The patient is not nervous/anxious.     Physical Exam  Constitutional: She is oriented to person, place, and time. She appears well-developed and well-nourished. No distress.  HENT:  Head: Normocephalic and atraumatic.  Mouth/Throat: No oropharyngeal exudate.  Eyes: Pupils are equal, round, and reactive to light. EOM are normal.  Neck: Normal range of motion. Neck supple. No  JVD present. No tracheal deviation present. No thyromegaly present.  Cardiovascular: Normal rate, regular rhythm and normal heart sounds. Exam reveals no gallop and no friction rub.  No murmur heard. Pulmonary/Chest: Effort normal and breath sounds normal. No respiratory distress. She has no wheezes. She has no rales. She exhibits no tenderness.  Abdominal: Soft. Bowel sounds are normal. There is no tenderness.  Musculoskeletal: Normal range of motion.  Lymphadenopathy:    She has no cervical adenopathy.  Neurological: She is alert and oriented to person, place, and time. No cranial nerve deficit.  Skin: Skin is warm and dry. She is not diaphoretic.  Multiple, subcutaneous masses along the right lower back. New masses stretch from the spine to the right flank. They are soft and smooth in contour. Slightly tender to palpate.   Psychiatric: Her speech is normal and behavior is normal. Judgment and thought content normal. Her mood appears anxious. Cognition and memory are normal.  Nursing note and vitals reviewed.  Assessment/Plan:  1. 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 Will provide referral as needed.   2. Unspecified menopausal and perimenopausal disorder Check labs and discuss with patient when results are available.   3. Fatigue, unspecified type Check labs and discuss with patient when results are available.    General Counseling: josephene marrone understanding of the findings of todays visit and agrees with plan of treatment. I have discussed any further diagnostic evaluation that may be needed or ordered today. We also reviewed her medications today. she has been encouraged to call the office with any questions or concerns that should arise related to todays visit.    Counseling:  This patient was seen by Leretha Pol FNP Collaboration with Dr [name] as a part of collaborative  care agreement  Time spent: 25 Minutes

## 2018-09-15 DIAGNOSIS — R5383 Other fatigue: Secondary | ICD-10-CM | POA: Insufficient documentation

## 2018-09-15 DIAGNOSIS — N959 Unspecified menopausal and perimenopausal disorder: Secondary | ICD-10-CM | POA: Insufficient documentation

## 2018-09-17 DIAGNOSIS — M542 Cervicalgia: Secondary | ICD-10-CM | POA: Diagnosis not present

## 2018-09-17 DIAGNOSIS — F1120 Opioid dependence, uncomplicated: Secondary | ICD-10-CM | POA: Diagnosis not present

## 2018-09-17 DIAGNOSIS — G894 Chronic pain syndrome: Secondary | ICD-10-CM | POA: Diagnosis not present

## 2018-09-17 DIAGNOSIS — G8929 Other chronic pain: Secondary | ICD-10-CM | POA: Diagnosis not present

## 2018-09-17 DIAGNOSIS — M25552 Pain in left hip: Secondary | ICD-10-CM | POA: Diagnosis not present

## 2018-09-17 DIAGNOSIS — M545 Low back pain: Secondary | ICD-10-CM | POA: Diagnosis not present

## 2018-09-17 DIAGNOSIS — Z79891 Long term (current) use of opiate analgesic: Secondary | ICD-10-CM | POA: Diagnosis not present

## 2018-10-20 ENCOUNTER — Other Ambulatory Visit: Payer: Self-pay

## 2018-10-20 MED ORDER — PANTOPRAZOLE SODIUM 40 MG PO TBEC: 40.0000 mg | DELAYED_RELEASE_TABLET | Freq: Two times a day (BID) | ORAL | 6 refills | Status: DC

## 2018-11-11 IMAGING — CT CT ABD-PEL WO/W CM
3 of 12 series · 11 of 46 positions shown, 17 images · IV contrast (APPLIED)
Comparison: None.

CLINICAL DATA: Hematuria with dysuria and polyuria

EXAM:
CT ABDOMEN AND PELVIS WITHOUT AND WITH CONTRAST
TECHNIQUE: Multidetector CT imaging of the abdomen and pelvis was performed
following the standard protocol before and following the bolus
administration of intravenous contrast.
CONTRAST:  125mL 7MZQMB-EMM IOPAMIDOL (7MZQMB-EMM) INJECTION 61%

[Series 2: axial pre · axial · non-contrast · 0.91mm/px · z∈[-894,-564]mm · 6 of 94 slices shown, 11 images]
[im 14/94  soft-tissue]
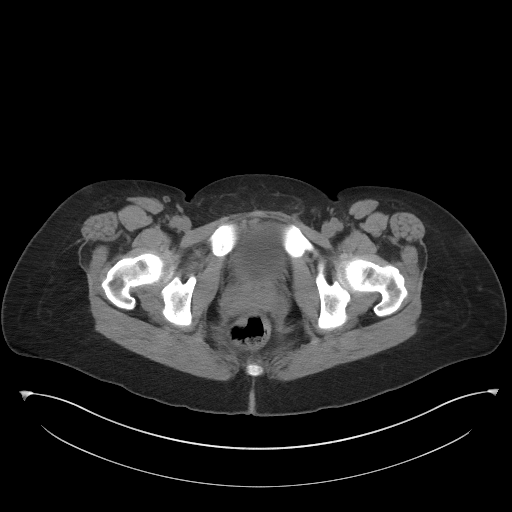
[im 14/94  bone]
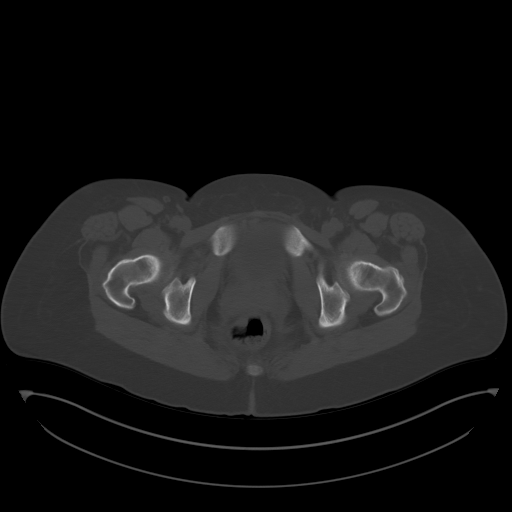
[im 27/94  soft-tissue]
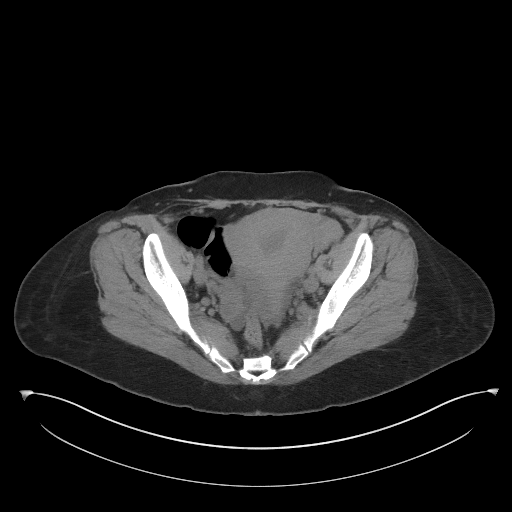
[im 40/94  soft-tissue]
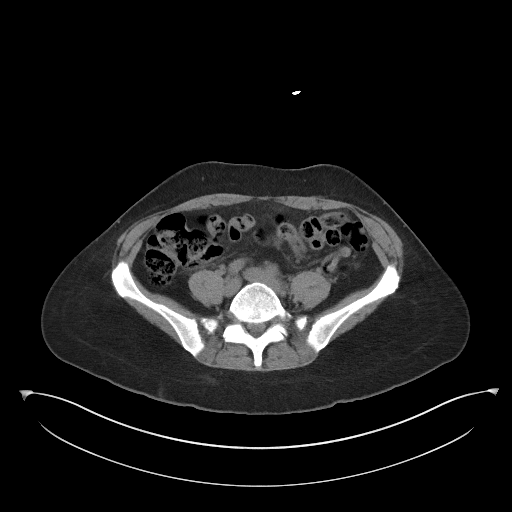
[im 40/94  lung]
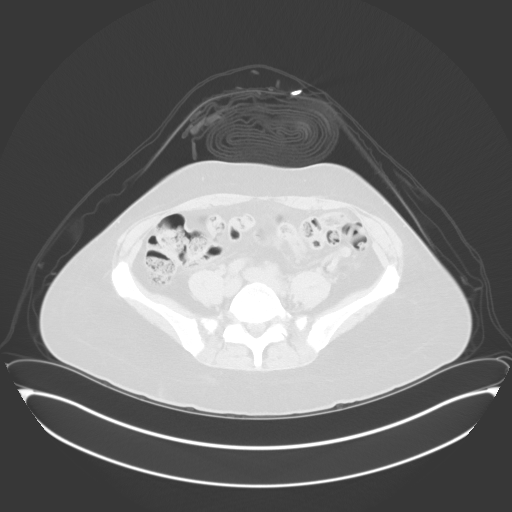
[im 54/94  soft-tissue]
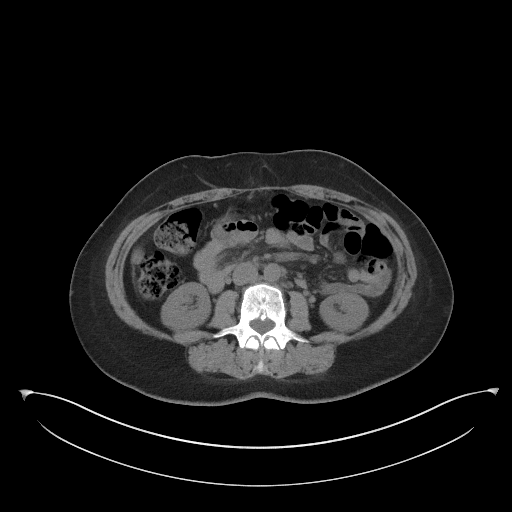
[im 54/94  lung]
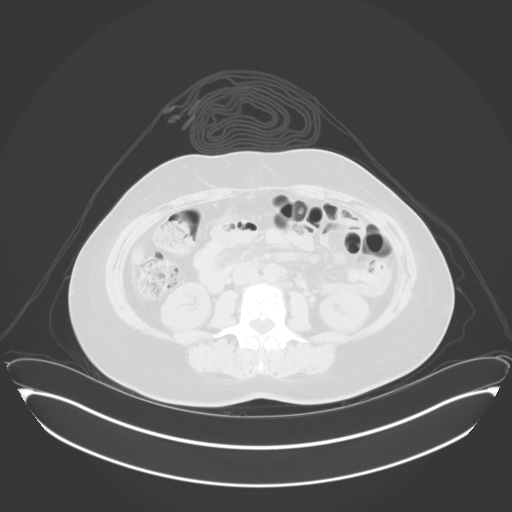
[im 67/94  soft-tissue]
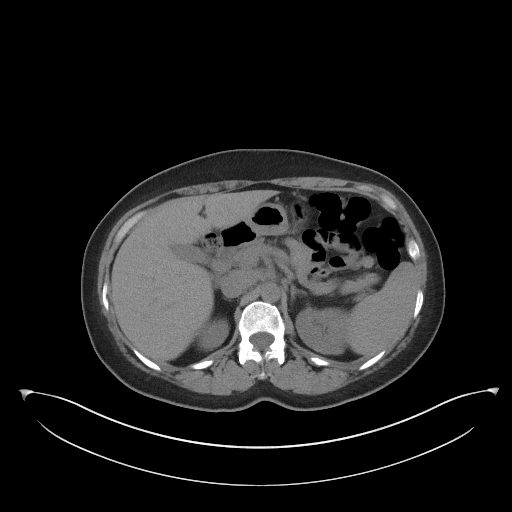
[im 67/94  lung]
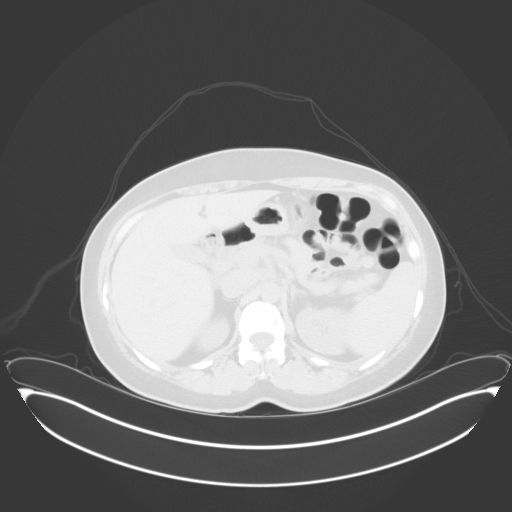
[im 80/94  soft-tissue]
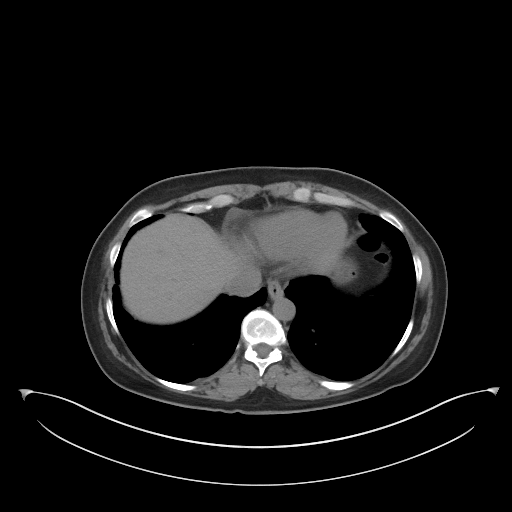
[im 80/94  lung]
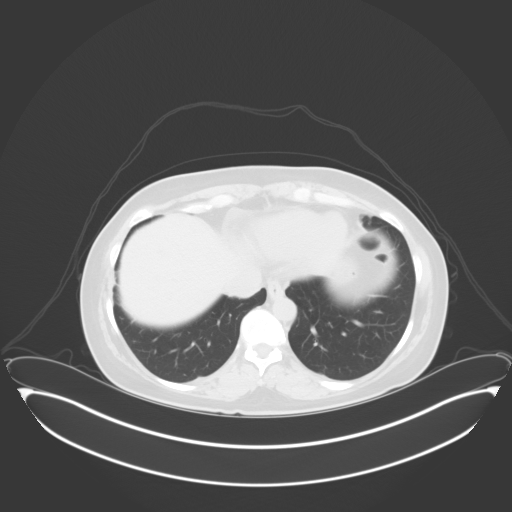

[Series 4: axial post · axial · 0.91mm/px · z∈[-894,-764]mm · 3 of 94 slices shown]
[im 14/94  soft-tissue]
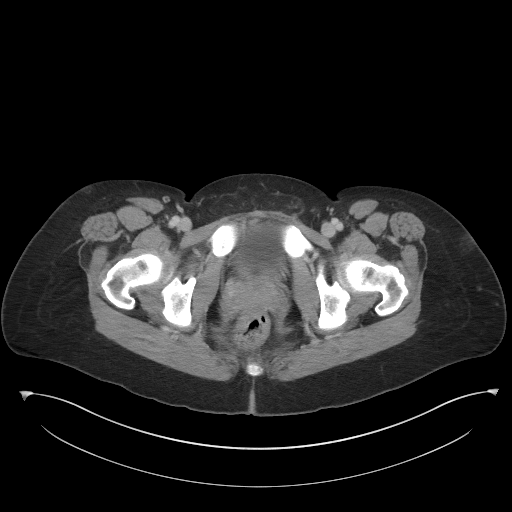
[im 27/94  soft-tissue]
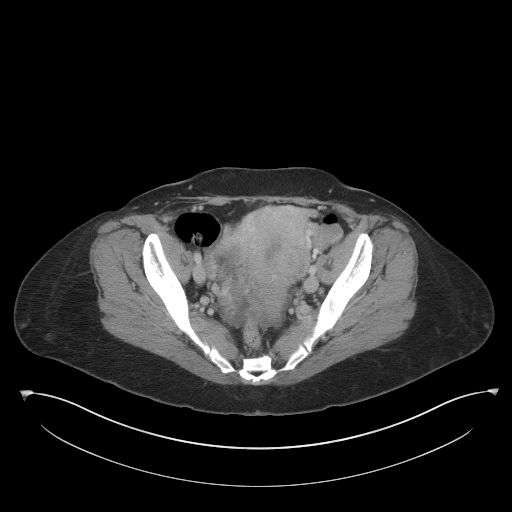
[im 40/94  soft-tissue]
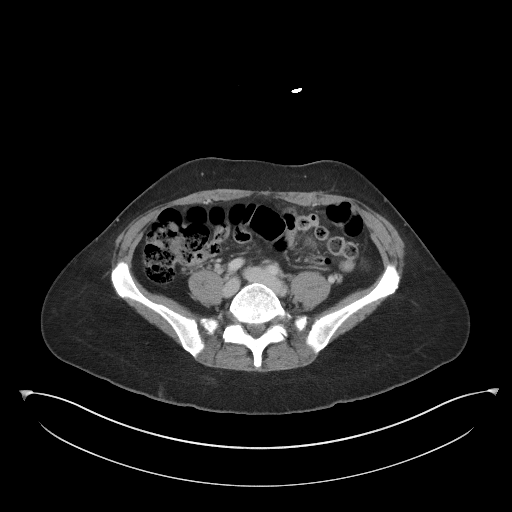

[Series 6: coronal pre · coronal · non-contrast · 0.78mm/px · 2 of 78 slices shown, 3 images]
[im 26/78  soft-tissue]
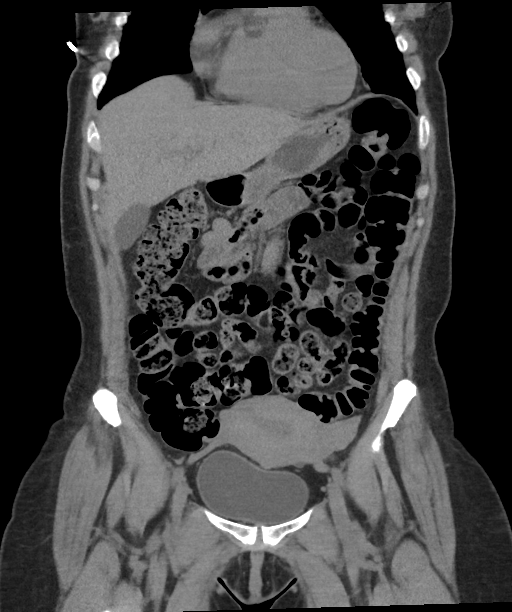
[im 26/78  bone]
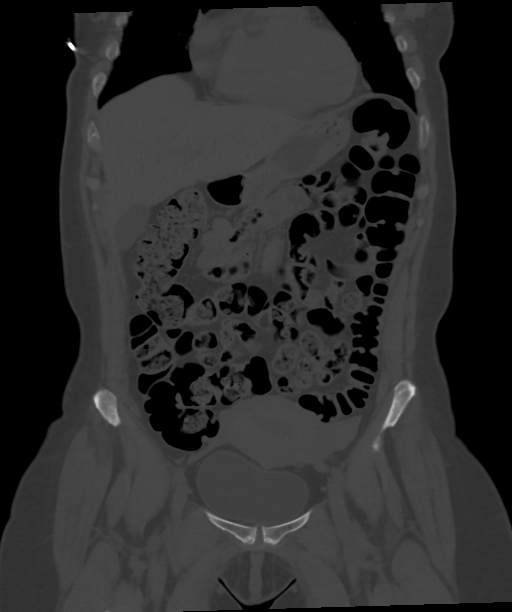
[im 52/78  soft-tissue]
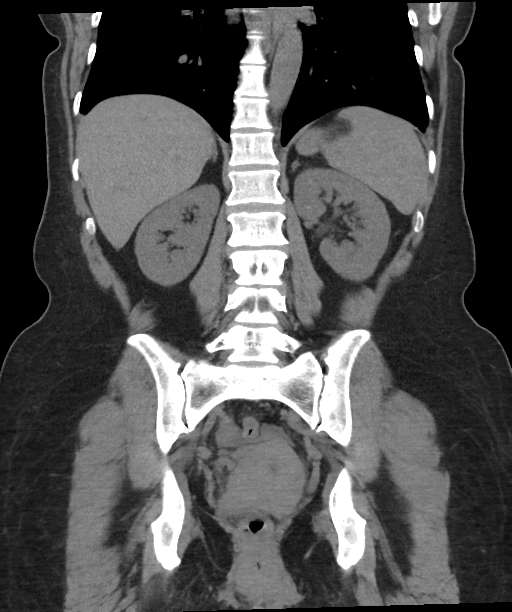

[11 of 46 positions shown; findings below may reference images not displayed]

FINDINGS: Lower chest: Unremarkable.

Hepatobiliary: 7 mm low-density lesion anterior right liver is too
small to characterize but likely a cyst. There is no evidence for
gallstones, gallbladder wall thickening, or pericholecystic fluid.
No intrahepatic or extrahepatic biliary dilation.

Pancreas: No focal mass lesion. No dilatation of the main duct. No
intraparenchymal cyst. No peripancreatic edema.

Spleen: No splenomegaly. No focal mass lesion.

Adrenals/Urinary Tract: No adrenal nodule or mass.

No stones are seen in either kidney or ureter.  No bladder stones.

Imaging after IV contrast administration shows no enhancing lesion
in the right kidney kidney. 10 mm low-density lesion identified
interpolar left kidney increases mildly in attenuation on portal
venous and delayed imaging. 12 mm cyst identified in the lower pole
of the left kidney.

Delayed imaging shows no wall thickening or soft tissue filling
defect in either intrarenal collecting system or renal pelvis. Both
ureters are well opacified with normal CT imaging appearance. No
focal bladder wall abnormality is evident.

Stomach/Bowel: Stomach is nondistended. No gastric wall thickening.
No evidence of outlet obstruction. Duodenum is normally positioned
as is the ligament of Treitz. No small bowel wall thickening. No
small bowel dilatation. The terminal ileum is normal. The appendix
is normal. No gross colonic mass. No colonic wall thickening. No
substantial diverticular change.

Vascular/Lymphatic: No abdominal aortic aneurysm. No abdominal
aortic atherosclerotic calcification. There is no gastrohepatic or
hepatoduodenal ligament lymphadenopathy. No intraperitoneal or
retroperitoneal lymphadenopathy. No pelvic sidewall lymphadenopathy.

Reproductive: Endometrium measures about 13 mm in thickness on
sagittal images.. There is no adnexal mass.

Other: Trace intraperitoneal free fluid noted in the cul-de-sac.

Musculoskeletal: Bone windows reveal no worrisome lytic or sclerotic
osseous lesions.
IMPRESSION: 10 mm low-density lesion interpolar left kidney may enhance slightly
after IV contrast administration. Given the history of hematuria,
MRI of the abdomen without and with contrast recommended to further
evaluate.

12 mm simple cyst identified lower pole left kidney.

## 2018-11-22 ENCOUNTER — Other Ambulatory Visit: Payer: Self-pay

## 2018-11-22 MED ORDER — FAMOTIDINE 20 MG PO TABS: 20.0000 mg | ORAL_TABLET | Freq: Every day | ORAL | 4 refills | Status: DC

## 2018-12-31 ENCOUNTER — Ambulatory Visit: Payer: BC Managed Care – PPO | Admitting: Nurse Practitioner

## 2018-12-31 ENCOUNTER — Other Ambulatory Visit: Payer: Self-pay

## 2018-12-31 ENCOUNTER — Encounter: Payer: Self-pay | Admitting: Nurse Practitioner

## 2018-12-31 VITALS — BP 93/65 | HR 68 | Temp 98.7°F | Resp 16 | Ht 64.0 in | Wt 163.6 lb

## 2018-12-31 DIAGNOSIS — R05 Cough: Secondary | ICD-10-CM

## 2018-12-31 DIAGNOSIS — J0100 Acute maxillary sinusitis, unspecified: Secondary | ICD-10-CM

## 2018-12-31 DIAGNOSIS — R509 Fever, unspecified: Secondary | ICD-10-CM | POA: Diagnosis not present

## 2018-12-31 DIAGNOSIS — R059 Cough, unspecified: Secondary | ICD-10-CM

## 2018-12-31 LAB — POCT RAPID STREP A (OFFICE): Rapid Strep A Screen: NEGATIVE

## 2018-12-31 LAB — POCT INFLUENZA A/B
Influenza A, POC: NEGATIVE
Influenza B, POC: NEGATIVE

## 2018-12-31 MED ORDER — HYDROCOD POLST-CPM POLST ER 10-8 MG/5ML PO SUER: 5.0000 mL | Freq: Two times a day (BID) | ORAL | 0 refills | Status: DC | PRN

## 2018-12-31 MED ORDER — AZITHROMYCIN 250 MG PO TABS: ORAL_TABLET | ORAL | 0 refills | Status: DC

## 2018-12-31 NOTE — Progress Notes (Signed)
[facility] [place], [place] [address]  Internal MEDICINE  Office Visit Note  Patient Name: Andrea Bernard  [id number]  [id number]  Date of Service: 12/31/2018   Pt is here for a sick visit.  Chief Complaint  Patient presents with  . Fever    FEVER, HEADACHE, RUNNY NOSE STARTED 4WKS AGO, fever went away week and half later cough is back with chest congestion,   . Cough    cough is worse at night,  . Wheezing  . Sore Throat    due to coughing,      The patient states that she has not had known contact with anyone who has been diagnosed with COVID 19. She has not travelled out of state.   Cough  This is a recurrent problem. The current episode started 1 to 4 weeks ago. The problem has been waxing and waning. The problem occurs every few minutes. The cough is non-productive. Associated symptoms include ear congestion, headaches, myalgias, nasal congestion, postnasal drip, rhinorrhea and a sore throat. Pertinent negatives include no chest pain, chills, fever, rash, shortness of breath or wheezing. The symptoms are aggravated by lying down and stress. She has tried OTC cough suppressant for the symptoms. The treatment provided no relief.        Current Medication:  Outpatient Encounter Medications as of 12/31/2018  Medication Sig  . acetaminophen (TYLENOL) 325 MG tablet Take 650 mg by mouth.  . famotidine (PEPCID) 20 MG tablet Take 1 tablet (20 mg total) by mouth daily.  [name] gabapentin (NEURONTIN) 300 MG capsule Take 300 mg by mouth 3 (three) times daily.  . OXYCONTIN 80 MG 12 hr tablet Take 80 mg by mouth every 12 (twelve) hours.  . pantoprazole (PROTONIX) 40 MG tablet Take 1 tablet (40 mg total) by mouth 2 (two) times daily.  . SUMAtriptan (IMITREX) 100 MG tablet Take 100 mg by mouth daily as needed.  [name] azithromycin (ZITHROMAX) 250 MG tablet z-pack - take as directed for 5 days  . chlorpheniramine-HYDROcodone (TUSSIONEX PENNKINETIC ER) 10-8 MG/5ML  SUER Take 5 mLs by mouth every 12 (twelve) hours as needed for cough.  . estradiol-norethindrone (ACTIVELLA) 1-0.5 MG tablet Take 1 tablet by mouth daily.   Facility-Administered Encounter Medications as of 12/31/2018  Medication  . iron sucrose (VENOFER) 200 mg IVPB  . sodium chloride flush (NS) 0.9 % injection 10 mL      Medical History: Past Medical History:  Diagnosis Date  . Arthritis   . GERD (gastroesophageal reflux disease)   . Headache   . Iron deficiency anemia 06/15/2017  . Neurofibromatosis ([place])    Today's Vitals   12/31/18 0904  BP: 93/65  Pulse: 68  Resp: 16  Temp: 98.7 F (37.1 C)  SpO2: 99%  Weight: 163 lb 9.6 oz (74.2 kg)  Height: 5\' 4"  (1.626 m)   Body mass index is 28.08 kg/m.  Review of Systems  Constitutional: Positive for fatigue. Negative for activity change, chills, fever and unexpected weight change.  HENT: Positive for postnasal drip, rhinorrhea, sore throat and voice change. Negative for congestion and sneezing.   Respiratory: Positive for cough. Negative for chest tightness, shortness of breath and wheezing.   Cardiovascular: Negative for chest pain and palpitations.  Gastrointestinal: Negative for abdominal pain, constipation, diarrhea, nausea and vomiting.  Musculoskeletal: Positive for myalgias. Negative for arthralgias, back pain, joint swelling and neck pain.  Skin: Negative for rash.  Neurological: Positive for headaches. Negative for tremors and numbness.  Hematological: Negative for adenopathy. Does not bruise/bleed easily.  Psychiatric/Behavioral: Negative for behavioral problems (Depression), sleep disturbance and suicidal ideas. The patient is not nervous/anxious.     Physical Exam Vitals signs and nursing note reviewed.  Constitutional:      General: She is not in acute distress.    Appearance: She is well-developed. She is ill-appearing. She is not diaphoretic.  HENT:     Head: Normocephalic and atraumatic.     Right Ear:  Tympanic membrane is erythematous and bulging.     Left Ear: Tympanic membrane is erythematous and bulging.     Nose: Congestion present.     Right Sinus: Maxillary sinus tenderness and frontal sinus tenderness present.     Left Sinus: Maxillary sinus tenderness and frontal sinus tenderness present.     Mouth/Throat:     Pharynx: Posterior oropharyngeal erythema present. No oropharyngeal exudate.  Eyes:     Pupils: Pupils are equal, round, and reactive to light.  Neck:     Musculoskeletal: Normal range of motion and neck supple.     Thyroid: No thyromegaly.     Vascular: No JVD.     Trachea: No tracheal deviation.  Cardiovascular:     Rate and Rhythm: Normal rate and regular rhythm.     Heart sounds: Normal heart sounds. No murmur. No friction rub. No gallop.   Pulmonary:     Effort: Pulmonary effort is normal. No respiratory distress.     Breath sounds: Normal breath sounds. No wheezing or rales.     Comments: Harsh, non-productive cough noted.  Chest:     Chest wall: No tenderness.  Abdominal:     General: Bowel sounds are normal.     Palpations: Abdomen is soft.  Musculoskeletal: Normal range of motion.  Lymphadenopathy:     Cervical: Cervical adenopathy present.  Skin:    General: Skin is warm and dry.  Neurological:     Mental Status: She is alert and oriented to person, place, and time.     Cranial Nerves: No cranial nerve deficit.  Psychiatric:        Behavior: Behavior normal.        Thought Content: Thought content normal.        Judgment: Judgment normal.   Assessment/Plan:  1. Acute non-recurrent maxillary sinusitis z-pack. Take as directed for 5 days. Rest and increase fluids. Use OTC medication as needed and as prescribed  - azithromycin (ZITHROMAX) 250 MG tablet; z-pack - take as directed for 5 days  Dispense: 6 tablet; Refill: 0  2. Fever, unspecified fever cause Strep and flu tests both negative today.  - POCT rapid strep A - POCT Influenza A/B  3.  Cough Written prescription for tussionex given. May be used twice daily as needed for cough. Advised patient not to overuse this medicine and not to mix with other medications or alcohol as it can cause respiratory distress, sleepiness or dizziness. Should also avoid driving. Patient voiced understanding and agreement.  - chlorpheniramine-HYDROcodone (TUSSIONEX PENNKINETIC ER) 10-8 MG/5ML SUER; Take 5 mLs by mouth every 12 (twelve) hours as needed for cough.  Dispense: 115 mL; Refill: 0.   General Counseling: norleen xie understanding of the findings of todays visit and agrees with plan of treatment. I have discussed any further diagnostic evaluation that may be needed or ordered today. We also reviewed her medications today. she has been encouraged to call the office with any questions or concerns that should arise related to todays visit.  Counseling:  Rest and increase fluids. Continue using OTC medication to control symptoms.   This patient was seen by Leretha Pol FNP Collaboration with Dr [name] as a part of collaborative care agreement  Orders Placed This Encounter  Procedures  . POCT rapid strep A  . POCT Influenza A/B    Meds ordered this encounter  Medications  . azithromycin (ZITHROMAX) 250 MG tablet    Sig: z-pack - take as directed for 5 days    Dispense:  6 tablet    Refill:  0    Order Specific Question:   Supervising Provider    Answer:   [name] Morrison  . chlorpheniramine-HYDROcodone (TUSSIONEX PENNKINETIC ER) 10-8 MG/5ML SUER    Sig: Take 5 mLs by mouth every 12 (twelve) hours as needed for cough.    Dispense:  115 mL    Refill:  0    Order Specific Question:   Supervising Provider    Answer:   [name] [[id number]]    Time spent: 25 Minutes

## 2019-01-07 DIAGNOSIS — G43009 Migraine without aura, not intractable, without status migrainosus: Secondary | ICD-10-CM | POA: Diagnosis not present

## 2019-01-07 DIAGNOSIS — G43719 Chronic migraine without aura, intractable, without status migrainosus: Secondary | ICD-10-CM | POA: Diagnosis not present

## 2019-01-07 DIAGNOSIS — Q8500 Neurofibromatosis, unspecified: Secondary | ICD-10-CM | POA: Diagnosis not present

## 2019-01-19 ENCOUNTER — Other Ambulatory Visit: Payer: Self-pay | Admitting: Nurse Practitioner

## 2019-01-19 ENCOUNTER — Telehealth: Payer: Self-pay

## 2019-01-19 DIAGNOSIS — J0100 Acute maxillary sinusitis, unspecified: Secondary | ICD-10-CM

## 2019-01-19 MED ORDER — METHYLPREDNISOLONE 4 MG PO TBPK: ORAL_TABLET | ORAL | 0 refills | Status: DC

## 2019-01-19 MED ORDER — AZITHROMYCIN 250 MG PO TABS
ORAL_TABLET | ORAL | 0 refills | Status: DC
Start: 2019-01-19 — End: 2019-03-25

## 2019-01-19 NOTE — Telephone Encounter (Signed)
I sent second round z-pack and will add medrol dose pack for 6 days. Take as directed. If no better in next week, she should schedule another visit. Thanks.

## 2019-01-19 NOTE — Telephone Encounter (Signed)
Pt was notified.  

## 2019-02-25 ENCOUNTER — Other Ambulatory Visit: Payer: Self-pay | Admitting: Nurse Practitioner

## 2019-02-25 ENCOUNTER — Telehealth: Payer: Self-pay

## 2019-02-25 DIAGNOSIS — Z1239 Encounter for other screening for malignant neoplasm of breast: Secondary | ICD-10-CM

## 2019-02-25 NOTE — Progress Notes (Signed)
Order for screening mammogram placed in Epic

## 2019-02-25 NOTE — Telephone Encounter (Signed)
Order for screening mammogram placed in Epic

## 2019-02-25 NOTE — Telephone Encounter (Signed)
Called Andrea Bernard and lmom

## 2019-03-20 IMAGING — MR MR HEAD WO/W CM
13 series · 48 of 48 positions shown · IV contrast (multihance)
Comparison: Paranasal sinus CT 04/09/2005.

CLINICAL DATA: 51-year-old female with intractable headache for the
past 9 weeks. Right eye [REDACTED] region constant pain. Episodes of
dizziness. One episode of temporary right side hearing loss. History
of Neurofibromatosis with known left eye lesion.

EXAM:
MRI HEAD WITHOUT AND WITH CONTRAST
TECHNIQUE: Multiplanar, multiecho pulse sequences of the brain and surrounding
structures were obtained without and with intravenous contrast.
CONTRAST:  15mL MULTIHANCE GADOBENATE DIMEGLUMINE 529 MG/ML IV SOLN

[Series 2: T1 · sagittal · 5.0mm · 0.45mm/px · 1 of 27 slices shown (1 of 2)]
[im 1/27]
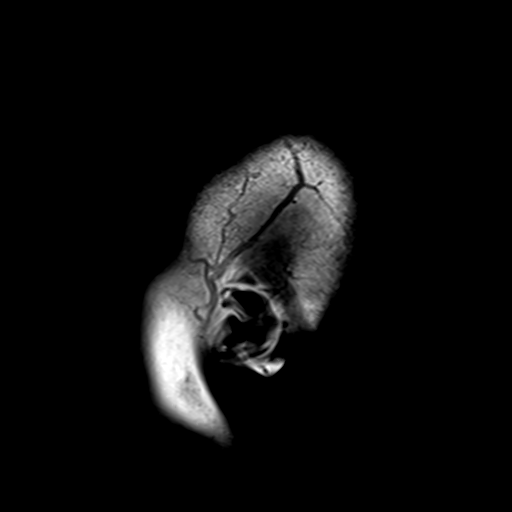

[Series 4: DWI · axial · 3.0mm · 1.80mm/px · z∈[-90,+70]mm · 3 of 54 slices shown (1 of 2)]
[im 1/54]
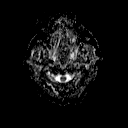
[im 27/54]
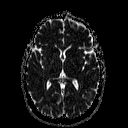
[im 54/54]
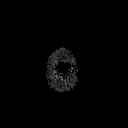

[Series 6: DWI · coronal · 3.0mm · 1.80mm/px · 3 of 47 slices shown (2 of 2)]
[im 1/47]
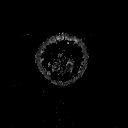
[im 24/47]
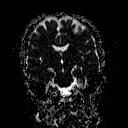
[im 47/47]
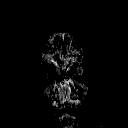

[Series 7: T2 · axial · 5.0mm · 0.60mm/px · z∈[-86,+69]mm · 2 of 25 slices shown (1 of 2)]
[im 1/25]
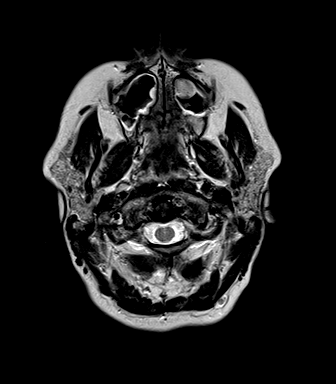
[im 25/25]
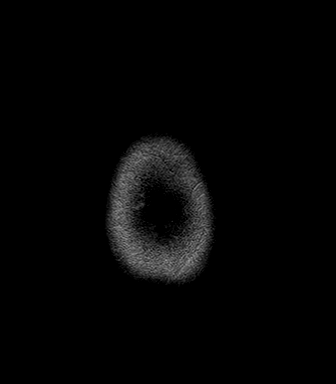

[Series 8: FLAIR · axial · 3.0mm · 0.45mm/px · z∈[-86,+69]mm · 3 of 53 slices shown]
[im 1/53]
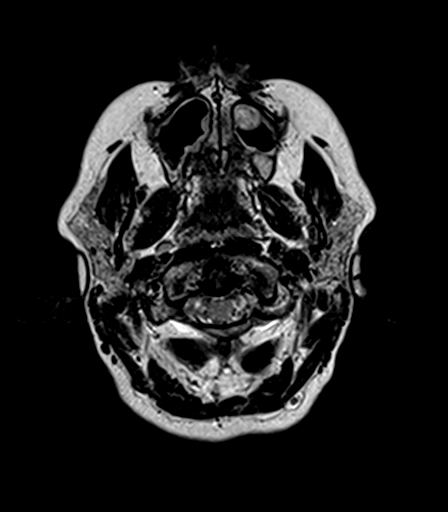
[im 27/53]
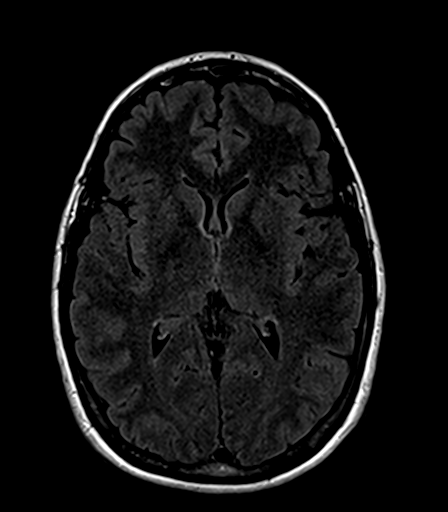
[im 53/53]
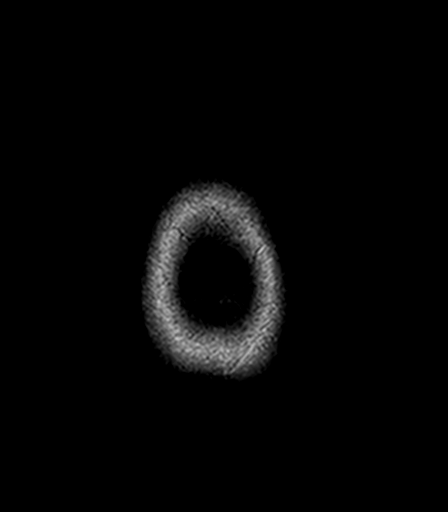

[Series 9: T2 · axial · 5.0mm · 0.45mm/px · z∈[-86,+69]mm · 2 of 25 slices shown (2 of 2)]
[im 1/25]
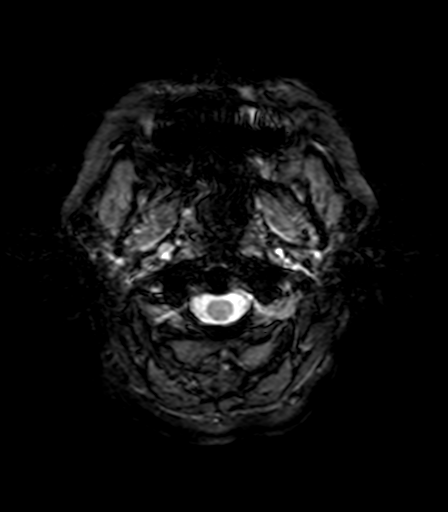
[im 25/25]
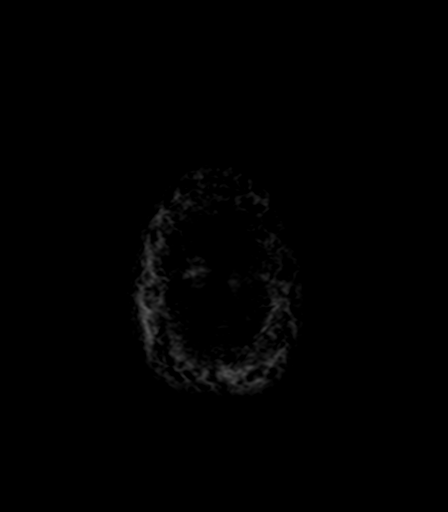

[Series 10: T1 · axial · 1.0mm · 1.00mm/px · z∈[-93,+80]mm · 11 of 176 slices shown (2 of 2)]
[im 1/176]
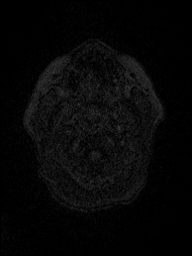
[im 18/176]
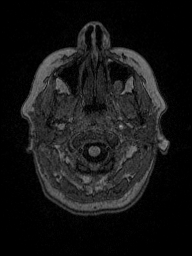
[im 36/176]
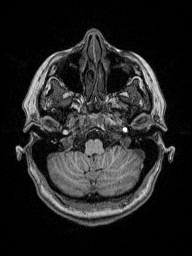
[im 53/176]
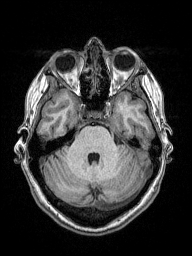
[im 71/176]
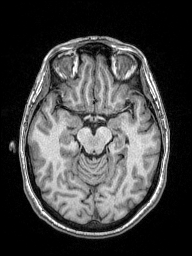
[im 88/176]
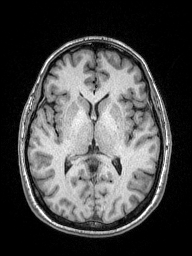
[im 106/176]
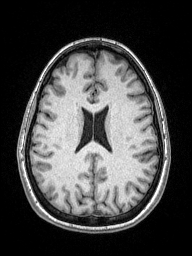
[im 123/176]
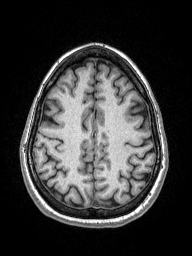
[im 141/176]
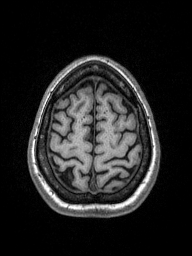
[im 158/176]
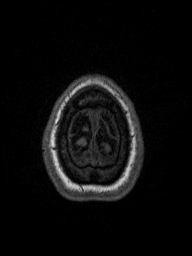
[im 176/176]
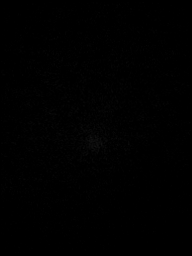

[Series 11: T2 post-contrast · coronal · 5.0mm · 0.49mm/px · 2 of 29 slices shown]
[im 1/29]
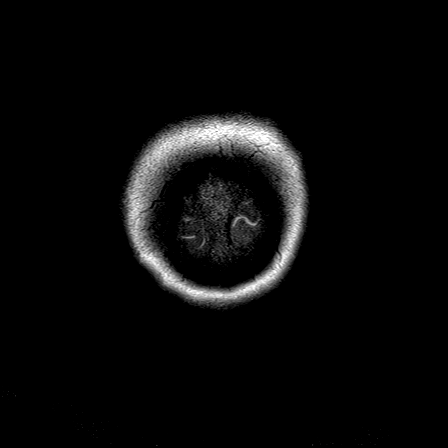
[im 29/29]
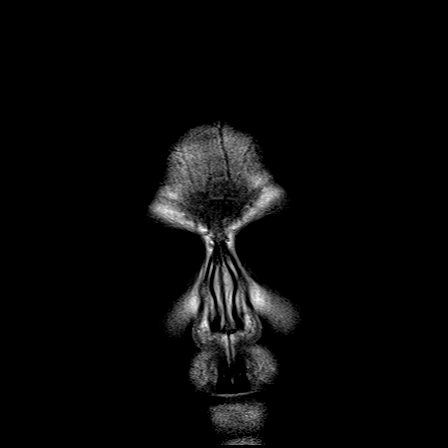

[Series 12: T1 post-contrast · axial · 1.0mm · 1.00mm/px · z∈[-93,+80]mm · 11 of 176 slices shown (1 of 3)]
[im 1/176]
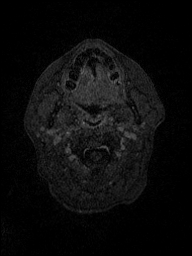
[im 18/176]
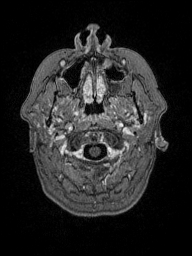
[im 36/176]
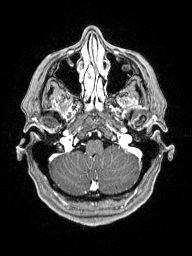
[im 53/176]
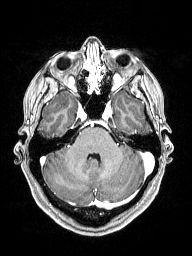
[im 71/176]
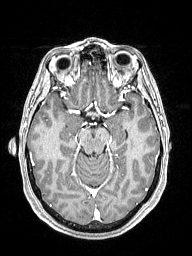
[im 88/176]
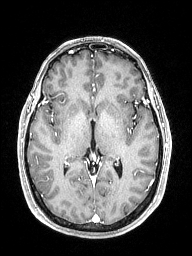
[im 106/176]
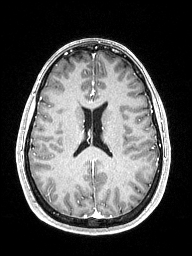
[im 123/176]
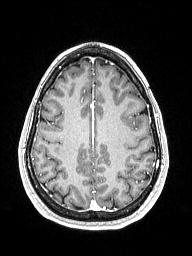
[im 141/176]
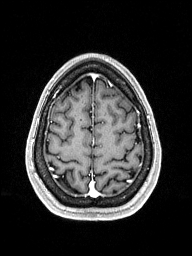
[im 158/176]
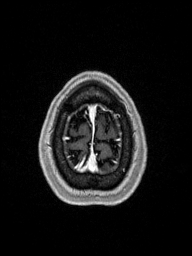
[im 176/176]
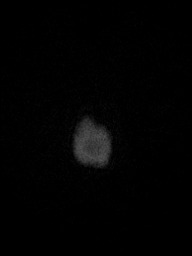

[Series 13: T1 post-contrast · coronal · 5.0mm · 0.43mm/px · 2 of 29 slices shown (2 of 3)]
[im 1/29]
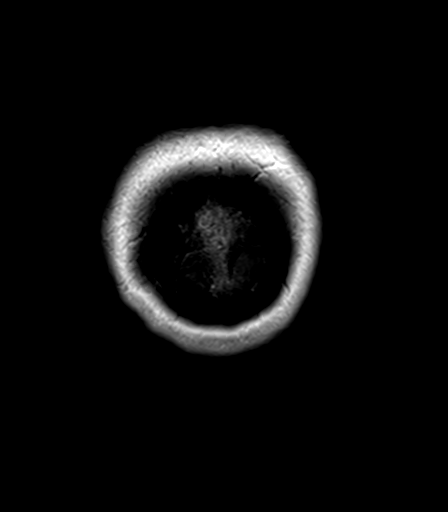
[im 29/29]
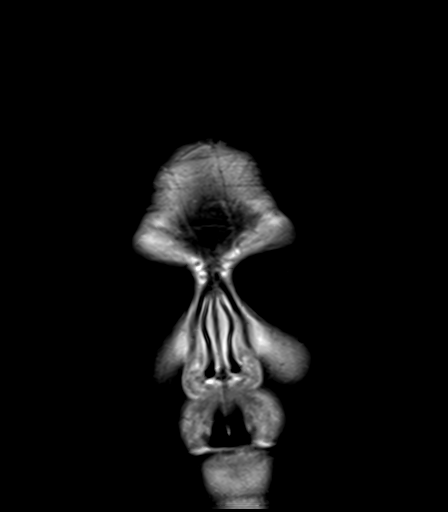

[Series 14: T1 post-contrast · sagittal · 5.0mm · 0.45mm/px · 2 of 27 slices shown (3 of 3)]
[im 1/27]
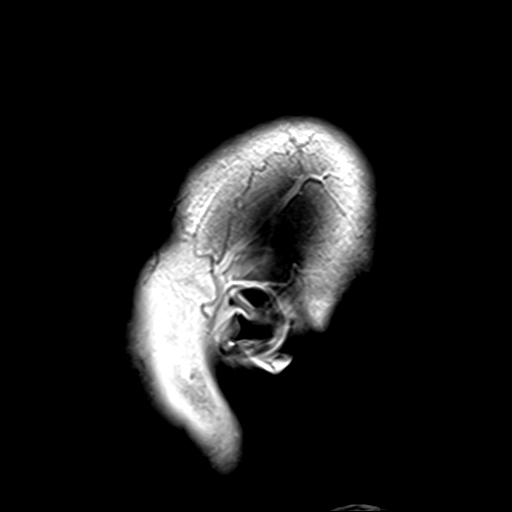
[im 27/27]
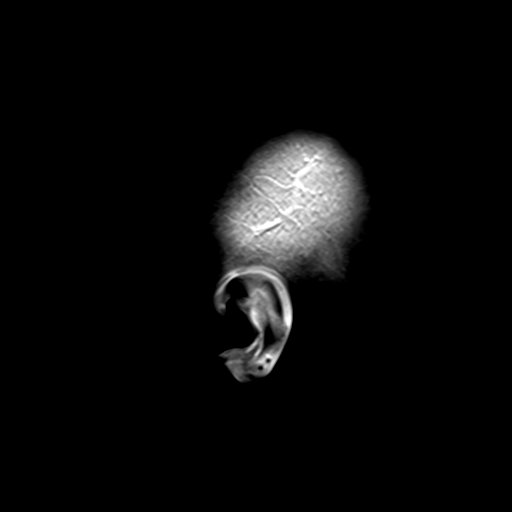

[Series 100: ax (id) · axial · 3.0mm · 1.80mm/px · z∈[-90,+70]mm · 3 of 55 slices shown]
[im 1/55]
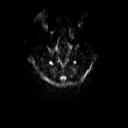
[im 28/55]
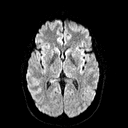
[im 55/55]
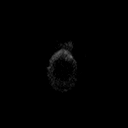

[Series 101: cor (id) · coronal · 3.0mm · 1.80mm/px · 3 of 47 slices shown]
[im 1/47]
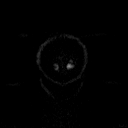
[im 24/47]
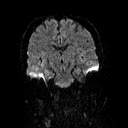
[im 47/47]
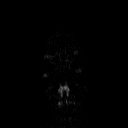

[48 of 48 positions shown; findings below may reference images not displayed]

FINDINGS: Brain: Left orbit findings are described below.

Cerebral volume is within normal limits. No restricted diffusion to
suggest acute infarction. No midline shift, mass effect,
ventriculomegaly, extra-axial collection or acute intracranial
hemorrhage. Cervicomedullary junction and pituitary are within
normal limits.

Gray and white matter signal is within normal limits throughout the
brain. No T2 hyperintensities are identified in the brain. No
encephalomalacia or chronic cerebral blood products. No abnormal
parenchymal enhancement. No dural thickening.

Vascular: Major intracranial vascular flow voids are preserved. The
major dural venous sinuses are enhancing and appear patent.

Skull and upper cervical spine: Normal visible cervical spine.
Visualized bone marrow signal is within normal limits.

Sinuses/Orbits: Chronic fusiform enlargement of the left optic nerve
within the orbit up to 13 millimeters diameter appears stable since
the 3993 CT. The enlarged nerve has decreased T1 and T2 signal with
no discernible enhancement following contrast (series 12, image 63
and series 13, image 24). At the left orbital apex and to the optic
chiasm the left optic nerve appears normal.

The chiasm is normal.

The right optic nerve is mildly enlarged up to 7 millimeters
diameter, without enhancement, and also stable since 3993. The other
right side intraorbital soft tissues are normal.

Scattered mostly mild paranasal sinus mucosal thickening. Mucosal
thickening is maximal in the ethmoids, with multiple small
superimposed mucous retention cysts in the left maxillary sinus.

Other: There is mild left petrous apex air cell fluid. No petrous
expansion or enhancement. Diffusion is facilitated. The bilateral
mastoids are clear. Visible internal auditory structures appear
normal.

The scalp and face soft tissues appear negative.
IMPRESSION: 1. No acute intracranial abnormality and normal MRI appearance of
the brain.
2. Chronic bilateral optic nerve enlargement, greater on the left,
is stable since 3993. These are compatible with chronic optic nerve
glioma is in the setting of type 1 Neurofibromatosis.
3. Mild paranasal sinus mucosal thickening. Multiple small mucous
retention cysts in the left maxillary sinus.
4. Trace fluid in the left petrous apex is likely postinflammatory
and inconsequential.

## 2019-03-25 ENCOUNTER — Other Ambulatory Visit: Payer: Self-pay

## 2019-03-25 ENCOUNTER — Encounter: Payer: Self-pay | Admitting: Nurse Practitioner

## 2019-03-25 ENCOUNTER — Ambulatory Visit: Payer: BC Managed Care – PPO | Admitting: Nurse Practitioner

## 2019-03-25 VITALS — Ht 64.0 in | Wt 164.0 lb

## 2019-03-25 DIAGNOSIS — R300 Dysuria: Secondary | ICD-10-CM

## 2019-03-25 DIAGNOSIS — N390 Urinary tract infection, site not specified: Secondary | ICD-10-CM

## 2019-03-25 DIAGNOSIS — B373 Candidiasis of vulva and vagina: Secondary | ICD-10-CM

## 2019-03-25 DIAGNOSIS — B3731 Acute candidiasis of vulva and vagina: Secondary | ICD-10-CM

## 2019-03-25 MED ORDER — PHENAZOPYRIDINE HCL 200 MG PO TABS: 200.0000 mg | ORAL_TABLET | Freq: Three times a day (TID) | ORAL | 1 refills | Status: DC | PRN

## 2019-03-25 MED ORDER — SULFAMETHOXAZOLE-TRIMETHOPRIM 800-160 MG PO TABS: 1.0000 | ORAL_TABLET | Freq: Two times a day (BID) | ORAL | 0 refills | Status: DC

## 2019-03-25 MED ORDER — FLUCONAZOLE 150 MG PO TABS
ORAL_TABLET | ORAL | 1 refills | Status: DC
Start: 2019-03-25 — End: 2019-06-16

## 2019-03-25 NOTE — Progress Notes (Signed)
[facility] [place], [place] [address]  Internal MEDICINE  Telephone Visit  Patient Name: Andrea Bernard  [id number]  [id number]  Date of Service: 03/29/2019  I connected with the patient at 1:06pm by webcam and verified the patients identity using two identifiers.   I discussed the limitations, risks, security and privacy concerns of performing an evaluation and management service by webcam and the availability of in person appointments. I also discussed with the patient that there may be a patient responsible charge related to the service.  The patient expressed understanding and agrees to proceed.    Chief Complaint  Patient presents with  . Urinary Tract Infection    PAINFUL TO URINATE , TO HAVE SEX IS PAINFUL, ALOT OF PRESSURE .  [name] Telephone Screen  . Telephone Assessment    The patient has been contacted via webcam for follow up visit due to concerns for spread of novel coronavirus. The patient has symptoms of dysuria, having a lot of pain with urination, and pain with intercourse. She denies abdominal pain, nausea, or vomiting. She denies fever or headache.       Current Medication: Outpatient Encounter Medications as of 03/25/2019  Medication Sig  . acetaminophen (TYLENOL) 325 MG tablet Take 650 mg by mouth.  . estradiol-norethindrone (ACTIVELLA) 1-0.5 MG tablet Take 1 tablet by mouth daily.  . famotidine (PEPCID) 20 MG tablet Take 1 tablet (20 mg total) by mouth daily.  . fluconazole (DIFLUCAN) 150 MG tablet Take 1 tablet po once. May repeat dose in 3 days as needed for persistent symptoms.  [name] gabapentin (NEURONTIN) 300 MG capsule Take 300 mg by mouth 3 (three) times daily.  . OXYCONTIN 80 MG 12 hr tablet Take 80 mg by mouth every 12 (twelve) hours.  . pantoprazole (PROTONIX) 40 MG tablet Take 1 tablet (40 mg total) by mouth 2 (two) times daily.  . phenazopyridine (PYRIDIUM) 200 MG tablet Take 1 tablet (200 mg total) by mouth 3 (three) times  daily as needed for pain.  [name] sulfamethoxazole-trimethoprim (BACTRIM DS) 800-160 MG tablet Take 1 tablet by mouth 2 (two) times daily.  . SUMAtriptan (IMITREX) 100 MG tablet Take 100 mg by mouth daily as needed.  . [DISCONTINUED] azithromycin (ZITHROMAX) 250 MG tablet z-pack - take as directed for 5 days (Patient not taking: Reported on 03/25/2019)  . [DISCONTINUED] chlorpheniramine-HYDROcodone (TUSSIONEX PENNKINETIC ER) 10-8 MG/5ML SUER Take 5 mLs by mouth every 12 (twelve) hours as needed for cough. (Patient not taking: Reported on 03/25/2019)  . [DISCONTINUED] methylPREDNISolone (MEDROL) 4 MG TBPK tablet Take by mouth as directed for 6 days (Patient not taking: Reported on 03/25/2019)   Facility-Administered Encounter Medications as of 03/25/2019  Medication  . iron sucrose (VENOFER) 200 mg IVPB  . sodium chloride flush (NS) 0.9 % injection 10 mL    Surgical History: Past Surgical History:  Procedure Laterality Date  . carpel tunnel release    . CESAREAN SECTION     x 4  . COLONOSCOPY WITH PROPOFOL N/A 09/04/2017   Procedure: COLONOSCOPY WITH PROPOFOL;  Surgeon: [name], MD;  Location: [facility] ENDOSCOPY;  Service: Gastroenterology;  Laterality: N/A;  . COLONOSCOPY WITH PROPOFOL N/A 12/21/2017   Procedure: COLONOSCOPY WITH PROPOFOL;  Surgeon: [name], MD;  Location: [facility] ENDOSCOPY;  Service: Gastroenterology;  Laterality: N/A;  . ESOPHAGOGASTRODUODENOSCOPY (EGD) WITH PROPOFOL N/A 09/04/2017   Procedure: ESOPHAGOGASTRODUODENOSCOPY (EGD) WITH PROPOFOL;  Surgeon: [name], MD;  Location: [facility] ENDOSCOPY;  Service: Gastroenterology;  Laterality: N/A;  .  HERNIA REPAIR    . TUBAL LIGATION    . TUMOR REMOVAL      Medical History: Past Medical History:  Diagnosis Date  . Arthritis   . GERD (gastroesophageal reflux disease)   . Headache   . Iron deficiency anemia 06/15/2017  . Neurofibromatosis ([place])     Family History: Family History  Problem Relation Age of Onset  . Anemia  Mother   . Bladder Cancer Neg Hx   . Kidney cancer Neg Hx   . Breast cancer Neg Hx   . Ovarian cancer Neg Hx   . Colon cancer Neg Hx     Social History   Socioeconomic History  . Marital status: Married    Spouse name: Not on file  . Number of children: Not on file  . Years of education: Not on file  . Highest education level: Not on file  Occupational History  . Not on file  Social Needs  . Financial resource strain: Not on file  . Food insecurity    Worry: Not on file    Inability: Not on file  . Transportation needs    Medical: Not on file    Non-medical: Not on file  Tobacco Use  . Smoking status: Never Smoker  . Smokeless tobacco: Never Used  Substance and Sexual Activity  . Alcohol use: No  . Drug use: Yes    Types: Other-see comments    Comment: Oxycotin  . Sexual activity: Yes    Birth control/protection: Surgical  Lifestyle  . Physical activity    Days per week: Not on file    Minutes per session: Not on file  . Stress: Not on file  Relationships  . Social Herbalist on phone: Not on file    Gets together: Not on file    Attends religious service: Not on file    Active member of club or organization: Not on file    Attends meetings of clubs or organizations: Not on file    Relationship status: Not on file  . Intimate partner violence    Fear of current or ex partner: Not on file    Emotionally abused: Not on file    Physically abused: Not on file    Forced sexual activity: Not on file  Other Topics Concern  . Not on file  Social History Narrative  . Not on file      Review of Systems  Constitutional: Negative for chills, fatigue, fever and unexpected weight change.  HENT: Negative for congestion, postnasal drip, rhinorrhea, sneezing and sore throat.   Eyes: Negative for redness.  Respiratory: Negative for cough, chest tightness, shortness of breath and wheezing.   Cardiovascular: Negative for chest pain and palpitations.   Gastrointestinal: Negative for abdominal pain, constipation, diarrhea, nausea and vomiting.  Genitourinary: Positive for dysuria, frequency and urgency.  Musculoskeletal: Positive for back pain. Negative for arthralgias, joint swelling and neck pain.  Skin: Negative for rash.  Neurological: Negative for tremors, numbness and headaches.  Hematological: Negative for adenopathy. Does not bruise/bleed easily.  Psychiatric/Behavioral: Negative for behavioral problems (Depression), sleep disturbance and suicidal ideas. The patient is not nervous/anxious.     Today's Vitals   03/25/19 1227  Weight: 164 lb (74.4 kg)  Height: 5\' 4"  (1.626 m)   Body mass index is 28.15 kg/m.  Observation/Objective:   The patient is alert and oriented. She is pleasant and answers all questions appropriately. Breathing is non-labored. She is in no  acute distress at this time.    Assessment/Plan: 1. Urinary tract infection without hematuria, site unspecified Treat with bactrim ds twice daily for 10 days. Advised she increase intake of water and rest when possible.  - sulfamethoxazole-trimethoprim (BACTRIM DS) 800-160 MG tablet; Take 1 tablet by mouth 2 (two) times daily.  Dispense: 20 tablet; Refill: 0  2. Vaginal yeast infection Diflucan may be taken as needed and as prescribed if yeast infection develops.  - fluconazole (DIFLUCAN) 150 MG tablet; Take 1 tablet po once. May repeat dose in 3 days as needed for persistent symptoms.  Dispense: 3 tablet; Refill: 1  3. Dysuria Pyridium 200mg  may be taken up to three times daily if needed for bladder pain/spasms.  - phenazopyridine (PYRIDIUM) 200 MG tablet; Take 1 tablet (200 mg total) by mouth 3 (three) times daily as needed for pain.  Dispense: 10 tablet; Refill: 1  General Counseling: Berlynn verbalizes understanding of the findings of today's phone visit and agrees with plan of treatment. I have discussed any further diagnostic evaluation that may be needed or  ordered today. We also reviewed her medications today. she has been encouraged to call the office with any questions or concerns that should arise related to todays visit.  This patient was seen by [place] with Dr [name] as a part of collaborative care agreement  Meds ordered this encounter  Medications  . sulfamethoxazole-trimethoprim (BACTRIM DS) 800-160 MG tablet    Sig: Take 1 tablet by mouth 2 (two) times daily.    Dispense:  20 tablet    Refill:  0    Order Specific Question:   Supervising Provider    Answer:   [name] [[id number]]  . phenazopyridine (PYRIDIUM) 200 MG tablet    Sig: Take 1 tablet (200 mg total) by mouth 3 (three) times daily as needed for pain.    Dispense:  10 tablet    Refill:  1    Order Specific Question:   Supervising Provider    Answer:   [name] [[id number]]  . fluconazole (DIFLUCAN) 150 MG tablet    Sig: Take 1 tablet po once. May repeat dose in 3 days as needed for persistent symptoms.    Dispense:  3 tablet    Refill:  1    Order Specific Question:   Supervising Provider    Answer:   [name] [[id number]]    Time spent: 41 Minutes    Dr [name] Internal medicine

## 2019-03-29 DIAGNOSIS — B3731 Acute candidiasis of vulva and vagina: Secondary | ICD-10-CM | POA: Insufficient documentation

## 2019-03-29 DIAGNOSIS — B373 Candidiasis of vulva and vagina: Secondary | ICD-10-CM | POA: Insufficient documentation

## 2019-04-03 NOTE — Progress Notes (Deleted)
04/04/2019 12:19 PM   Andrea Bernard 09-14-1966 [id number]  Referring provider: [name], [place],  [place] [address]  No chief complaint on file.   HPI: Andrea Bernard is a 53 year old female with a history of hematuria who presents today for the complaint of dysuria.  History of hematuria Non- smoker.  CTU in 10/2017 noted no adrenal nodule or mass.  No stones are seen in either kidney or ureter.  No bladder stones.  Imaging after IV contrast administration shows no enhancing lesion in the right kidney kidney. 10 mm low-density lesion identified interpolar left kidney increases mildly in attenuation on portal venous and delayed imaging. 12 mm cyst identified in the lower pole of the left kidney.  Delayed imaging shows no wall thickening or soft tissue filling defect in either intrarenal collecting system or renal pelvis. Both ureters are well opacified with normal CT imaging appearance. No focal bladder wall abnormality is evident.  RUS in 02/2018 noted hypoechoic 1.4 cm renal cortical lesion in the medial interpolar left kidney, stable in size since 11/04/2017 CT study, where it was indeterminate. While the size stability is reassuring, this lesion is not confidently characterized as a benign cyst on either the prior CT or the current sonogram study. Follow-up MRI abdomen without and with IV contrast is recommended in 6 months for further characterization and to reassess the size.  Stable size of minimally complex septated 1.3 cm renal cyst in the lower left kidney.  Otherwise normal ultrasound of the kidneys and bladder, with no hydronephrosis.  Cystoscopy by Dr. Pilar Jarvis in 11/2017 was negative.    Left renal mass Seen on CTU and RUS and not confidently characterized, she is still in need of a MRI at this time.      PMH: Past Medical History:  Diagnosis Date  . Arthritis   . GERD (gastroesophageal reflux disease)   . Headache   . Iron deficiency anemia  06/15/2017  . Neurofibromatosis [facility])     Surgical History: Past Surgical History:  Procedure Laterality Date  . carpel tunnel release    . CESAREAN SECTION     x 4  . COLONOSCOPY WITH PROPOFOL N/A 09/04/2017   Procedure: COLONOSCOPY WITH PROPOFOL;  Surgeon: [name], MD;  Location: [facility] ENDOSCOPY;  Service: Gastroenterology;  Laterality: N/A;  . COLONOSCOPY WITH PROPOFOL N/A 12/21/2017   Procedure: COLONOSCOPY WITH PROPOFOL;  Surgeon: [name], MD;  Location: [facility] ENDOSCOPY;  Service: Gastroenterology;  Laterality: N/A;  . ESOPHAGOGASTRODUODENOSCOPY (EGD) WITH PROPOFOL N/A 09/04/2017   Procedure: ESOPHAGOGASTRODUODENOSCOPY (EGD) WITH PROPOFOL;  Surgeon: [name], MD;  Location: [facility] ENDOSCOPY;  Service: Gastroenterology;  Laterality: N/A;  . HERNIA REPAIR    . TUBAL LIGATION    . TUMOR REMOVAL      Home Medications:  Allergies as of 04/04/2019      Reactions   Nitrofuran Derivatives Nausea And Vomiting      Medication List       Accurate as of April 03, 2019 12:19 PM. If you have any questions, ask your nurse or doctor.        acetaminophen 325 MG tablet Commonly known as: TYLENOL Take 650 mg by mouth.   estradiol-norethindrone 1-0.5 MG tablet Commonly known as: Activella Take 1 tablet by mouth daily.   famotidine 20 MG tablet Commonly known as: PEPCID Take 1 tablet (20 mg total) by mouth daily.   fluconazole 150 MG tablet Commonly known as: DIFLUCAN Take 1 tablet  po once. May repeat dose in 3 days as needed for persistent symptoms.   gabapentin 300 MG capsule Commonly known as: NEURONTIN Take 300 mg by mouth 3 (three) times daily.   OxyCONTIN 80 mg 12 hr tablet Generic drug: oxyCODONE Take 80 mg by mouth every 12 (twelve) hours.   pantoprazole 40 MG tablet Commonly known as: PROTONIX Take 1 tablet (40 mg total) by mouth 2 (two) times daily.   phenazopyridine 200 MG tablet Commonly known as: PYRIDIUM Take 1 tablet (200 mg total) by mouth 3 (three)  times daily as needed for pain.   sulfamethoxazole-trimethoprim 800-160 MG tablet Commonly known as: BACTRIM DS Take 1 tablet by mouth 2 (two) times daily.   SUMAtriptan 100 MG tablet Commonly known as: IMITREX Take 100 mg by mouth daily as needed.       Allergies:  Allergies  Allergen Reactions  . Nitrofuran Derivatives Nausea And Vomiting    Family History: Family History  Problem Relation Age of Onset  . Anemia Mother   . Bladder Cancer Neg Hx   . Kidney cancer Neg Hx   . Breast cancer Neg Hx   . Ovarian cancer Neg Hx   . Colon cancer Neg Hx     Social History:  reports that she has never smoked. She has never used smokeless tobacco. She reports current drug use. Drug: Other-see comments. She reports that she does not drink alcohol.  ROS:                                        Physical Exam: There were no vitals taken for this visit.  Constitutional:  Well nourished. Alert and oriented, No acute distress. HEENT: [place] AT, moist mucus membranes.  Trachea midline, no masses. Cardiovascular: No clubbing, cyanosis, or edema. Respiratory: Normal respiratory effort, no increased work of breathing. GI: Abdomen is soft, non tender, non distended, no abdominal masses. Liver and spleen not palpable.  No hernias appreciated.  Stool sample for occult testing is not indicated.   GU: No CVA tenderness.  No bladder fullness or masses.  Patient with circumcised/uncircumcised phallus. ***Foreskin easily retracted***  Urethral meatus is patent.  No penile discharge. No penile lesions or rashes. Scrotum without lesions, cysts, rashes and/or edema.  Testicles are located scrotally bilaterally. No masses are appreciated in the testicles. Left and right epididymis are normal. Rectal: Patient with  normal sphincter tone. Anus and perineum without scarring or rashes. No rectal masses are appreciated. Prostate is approximately *** grams, *** nodules are appreciated. Seminal  vesicles are normal. Skin: No rashes, bruises or suspicious lesions. Lymph: No cervical or inguinal adenopathy. Neurologic: Grossly intact, no focal deficits, moving all 4 extremities. Psychiatric: Normal mood and affect.  Laboratory Data: Lab Results  Component Value Date   WBC 4.7 02/12/2018   HGB 13.4 02/12/2018   HCT 39.2 02/12/2018   MCV 85 02/12/2018   PLT 225 02/12/2018    Lab Results  Component Value Date   CREATININE 0.82 02/12/2018    No results found for: PSA  No results found for: TESTOSTERONE  No results found for: HGBA1C  Lab Results  Component Value Date   TSH 1.070 02/12/2018       Component Value Date/Time   CHOL 207 (H) 02/12/2018 1006   HDL 68 02/12/2018 1006   LDLCALC 126 (H) 02/12/2018 1006    Lab Results  Component Value  Date   AST 30 02/12/2018   Lab Results  Component Value Date   ALT 34 (H) 02/12/2018   No components found for: ALKALINEPHOPHATASE No components found for: BILIRUBINTOTAL  No results found for: ESTRADIOL  Urinalysis    Component Value Date/Time   COLORURINE YELLOW (A) 08/13/2017 1201   APPEARANCEUR Clear 12/08/2017 1625   LABSPEC 1.015 08/13/2017 1201   PHURINE 7.0 08/13/2017 1201   GLUCOSEU Negative 12/08/2017 1625   HGBUR NEGATIVE 08/13/2017 1201   BILIRUBINUR Negative 12/08/2017 1625   KETONESUR NEGATIVE 08/13/2017 1201   PROTEINUR Negative 12/08/2017 1625   PROTEINUR NEGATIVE 08/13/2017 1201   NITRITE Negative 12/08/2017 1625   NITRITE NEGATIVE 08/13/2017 1201   LEUKOCYTESUR Negative 12/08/2017 1625    I have reviewed the labs.   Pertinent Imaging: *** I have independently reviewed the films.    Assessment & Plan:  ***  There are no diagnoses linked to this encounter.  No follow-ups on file.  These notes generated with voice recognition software. I apologize for typographical errors.  Zara Council, PA-C  [facility] [address]  [place],  [place] [address] [phone]

## 2019-04-04 ENCOUNTER — Ambulatory Visit: Payer: BLUE CROSS/BLUE SHIELD | Admitting: Urology

## 2019-04-04 ENCOUNTER — Encounter: Payer: Self-pay | Admitting: Urology

## 2019-04-07 ENCOUNTER — Other Ambulatory Visit: Payer: Self-pay

## 2019-04-07 MED ORDER — FAMOTIDINE 20 MG PO TABS: 20.0000 mg | ORAL_TABLET | Freq: Every day | ORAL | 4 refills | Status: DC

## 2019-05-09 ENCOUNTER — Other Ambulatory Visit: Payer: Self-pay

## 2019-05-09 MED ORDER — PANTOPRAZOLE SODIUM 40 MG PO TBEC: 40.0000 mg | DELAYED_RELEASE_TABLET | Freq: Two times a day (BID) | ORAL | 1 refills | Status: DC

## 2019-05-27 ENCOUNTER — Ambulatory Visit
Admission: RE | Admit: 2019-05-27 | Discharge: 2019-05-27 | Disposition: A | Discharge status: Home / Self Care | Payer: BC Managed Care – PPO | Source: Ambulatory Visit | Attending: Nurse Practitioner | Admitting: Nurse Practitioner

## 2019-05-27 DIAGNOSIS — Z1239 Encounter for other screening for malignant neoplasm of breast: Secondary | ICD-10-CM

## 2019-05-27 DIAGNOSIS — Z1231 Encounter for screening mammogram for malignant neoplasm of breast: Secondary | ICD-10-CM | POA: Diagnosis not present

## 2019-05-30 NOTE — Progress Notes (Signed)
Negative mammogram

## 2019-06-16 ENCOUNTER — Encounter: Payer: Self-pay | Admitting: Nurse Practitioner

## 2019-06-16 ENCOUNTER — Other Ambulatory Visit: Payer: Self-pay

## 2019-06-16 ENCOUNTER — Ambulatory Visit: Payer: BC Managed Care – PPO | Admitting: Nurse Practitioner

## 2019-06-16 VITALS — Temp 96.8°F | Wt 162.0 lb

## 2019-06-16 DIAGNOSIS — B373 Candidiasis of vulva and vagina: Secondary | ICD-10-CM

## 2019-06-16 DIAGNOSIS — J069 Acute upper respiratory infection, unspecified: Secondary | ICD-10-CM

## 2019-06-16 DIAGNOSIS — R5383 Other fatigue: Secondary | ICD-10-CM | POA: Diagnosis not present

## 2019-06-16 DIAGNOSIS — E559 Vitamin D deficiency, unspecified: Secondary | ICD-10-CM

## 2019-06-16 DIAGNOSIS — B3731 Acute candidiasis of vulva and vagina: Secondary | ICD-10-CM

## 2019-06-16 DIAGNOSIS — D509 Iron deficiency anemia, unspecified: Secondary | ICD-10-CM

## 2019-06-16 DIAGNOSIS — Z0001 Encounter for general adult medical examination with abnormal findings: Secondary | ICD-10-CM

## 2019-06-16 MED ORDER — AZITHROMYCIN 250 MG PO TABS: ORAL_TABLET | ORAL | 0 refills | Status: DC

## 2019-06-16 MED ORDER — FLUCONAZOLE 150 MG PO TABS: ORAL_TABLET | ORAL | 1 refills | Status: DC

## 2019-06-16 NOTE — Progress Notes (Signed)
[facility] [place], [place] [address]  Internal MEDICINE  Telephone Visit  Patient Name: Andrea Bernard  [id number]  [id number]  Date of Service: 06/16/2019  I connected with the patient at 8:57am by webcam and verified the patients identity using two identifiers.   I discussed the limitations, risks, security and privacy concerns of performing an evaluation and management service by webcam and the availability of in person appointments. I also discussed with the patient that there may be a patient responsible charge related to the service.  The patient expressed understanding and agrees to proceed.    Chief Complaint  Patient presents with  . Telephone Screen  . Telephone Assessment  . URI    The patient has been contacted via webcam for follow up visit due to concerns for spread of novel coronavirus. The patient states that she has had headache, fatigue, scratchy throat, and congested cough. Started about a week ago. She has had intermittent low-grade fever. No high fever. She states that cough is productive of very thick mucus. She has been unable to go to work due to symptoms. First day  Of work missed was yesterday, 06/15/2019. Recommend she stay out of work the rest of the week. She does not believe she needs to have a work note. She did spend this past weekend at [facility] in [place]. She was around thousands of people. She states that most, but not all visitors were wearing masks.       Current Medication: Outpatient Encounter Medications as of 06/16/2019  Medication Sig  . acetaminophen (TYLENOL) 325 MG tablet Take 650 mg by mouth.  . famotidine (PEPCID) 20 MG tablet Take 1 tablet (20 mg total) by mouth daily.  . fluconazole (DIFLUCAN) 150 MG tablet Take 1 tablet po once. May repeat dose in 3 days as needed for persistent symptoms.  [name] gabapentin (NEURONTIN) 300 MG capsule Take 300 mg by mouth 3 (three) times daily.  . OXYCONTIN 80 MG 12 hr tablet  Take 80 mg by mouth every 12 (twelve) hours.  . pantoprazole (PROTONIX) 40 MG tablet Take 1 tablet (40 mg total) by mouth 2 (two) times daily.  . phenazopyridine (PYRIDIUM) 200 MG tablet Take 1 tablet (200 mg total) by mouth 3 (three) times daily as needed for pain.  [name] sulfamethoxazole-trimethoprim (BACTRIM DS) 800-160 MG tablet Take 1 tablet by mouth 2 (two) times daily.  . SUMAtriptan (IMITREX) 100 MG tablet Take 100 mg by mouth daily as needed.  . [DISCONTINUED] fluconazole (DIFLUCAN) 150 MG tablet Take 1 tablet po once. May repeat dose in 3 days as needed for persistent symptoms.  [name] azithromycin (ZITHROMAX) 250 MG tablet Take 2 tablets po on day one. Take one tablet po daily on days two through ten for upper respiratory infection.  [name] estradiol-norethindrone (ACTIVELLA) 1-0.5 MG tablet Take 1 tablet by mouth daily.   Facility-Administered Encounter Medications as of 06/16/2019  Medication  . iron sucrose (VENOFER) 200 mg IVPB  . sodium chloride flush (NS) 0.9 % injection 10 mL    Surgical History: Past Surgical History:  Procedure Laterality Date  . carpel tunnel release    . CESAREAN SECTION     x 4  . COLONOSCOPY WITH PROPOFOL N/A 09/04/2017   Procedure: COLONOSCOPY WITH PROPOFOL;  Surgeon: [name], MD;  Location: [facility] ENDOSCOPY;  Service: Gastroenterology;  Laterality: N/A;  . COLONOSCOPY WITH PROPOFOL N/A 12/21/2017   Procedure: COLONOSCOPY WITH PROPOFOL;  Surgeon: [name], MD;  Location: [facility]  ENDOSCOPY;  Service: Gastroenterology;  Laterality: N/A;  . ESOPHAGOGASTRODUODENOSCOPY (EGD) WITH PROPOFOL N/A 09/04/2017   Procedure: ESOPHAGOGASTRODUODENOSCOPY (EGD) WITH PROPOFOL;  Surgeon: [name], MD;  Location: [facility] ENDOSCOPY;  Service: Gastroenterology;  Laterality: N/A;  . HERNIA REPAIR    . TUBAL LIGATION    . TUMOR REMOVAL      Medical History: Past Medical History:  Diagnosis Date  . Arthritis   . GERD (gastroesophageal reflux disease)   . Headache   . Iron  deficiency anemia 06/15/2017  . Neurofibromatosis ([place])     Family History: Family History  Problem Relation Age of Onset  . Anemia Mother   . Bladder Cancer Neg Hx   . Kidney cancer Neg Hx   . Breast cancer Neg Hx   . Ovarian cancer Neg Hx   . Colon cancer Neg Hx     Social History   Socioeconomic History  . Marital status: Married    Spouse name: Not on file  . Number of children: Not on file  . Years of education: Not on file  . Highest education level: Not on file  Occupational History  . Not on file  Social Needs  . Financial resource strain: Not on file  . Food insecurity    Worry: Not on file    Inability: Not on file  . Transportation needs    Medical: Not on file    Non-medical: Not on file  Tobacco Use  . Smoking status: Never Smoker  . Smokeless tobacco: Never Used  Substance and Sexual Activity  . Alcohol use: No  . Drug use: Not Currently  . Sexual activity: Yes    Birth control/protection: Surgical  Lifestyle  . Physical activity    Days per week: Not on file    Minutes per session: Not on file  . Stress: Not on file  Relationships  . Social Herbalist on phone: Not on file    Gets together: Not on file    Attends religious service: Not on file    Active member of club or organization: Not on file    Attends meetings of clubs or organizations: Not on file    Relationship status: Not on file  . Intimate partner violence    Fear of current or ex partner: Not on file    Emotionally abused: Not on file    Physically abused: Not on file    Forced sexual activity: Not on file  Other Topics Concern  . Not on file  Social History Narrative  . Not on file      Review of Systems  Constitutional: Positive for chills, fatigue and fever.  HENT: Positive for congestion, postnasal drip, rhinorrhea, sinus pain, sore throat and voice change. Negative for ear pain.   Respiratory: Positive for cough and wheezing.   Cardiovascular: Negative  for chest pain and palpitations.  Gastrointestinal: Negative for diarrhea and vomiting.  Genitourinary:       Develops yeast infection every time she takes antibiotics .  Allergic/Immunologic: Positive for environmental allergies.  Neurological: Positive for dizziness and headaches.   Today's Vitals   06/16/19 0854  Temp: (!) 96.8 F (36 C)  Weight: 162 lb (73.5 kg)   Body mass index is 27.81 kg/m.  Observation/Objective:    The patient is alert and oriented. She is pleasant and answers all questions appropriately. Breathing is non-labored. She is in no acute distress at this time. The patient is hoarse and very congested,  nasally.    Assessment/Plan: 1. Acute upper respiratory infection Start zithromax. Take two tablets on day one. Take one tablet on days two through ten. Recommend OTC medications to treat acute symptoms. Rest and increase fluids . - azithromycin (ZITHROMAX) 250 MG tablet; Take 2 tablets po on day one. Take one tablet po daily on days two through ten for upper respiratory infection.  Dispense: 11 tablet; Refill: 0  2. Vaginal yeast infection - fluconazole (DIFLUCAN) 150 MG tablet; Take 1 tablet po once. May repeat dose in 3 days as needed for persistent symptoms.  Dispense: 3 tablet; Refill: 1  General Counseling: Palma verbalizes understanding of the findings of today's phone visit and agrees with plan of treatment. I have discussed any further diagnostic evaluation that may be needed or ordered today. We also reviewed her medications today. she has been encouraged to call the office with any questions or concerns that should arise related to todays visit.  Rest and increase fluids. Continue using OTC medication to control symptoms.   This patient was seen by [place] with Dr [name] as a part of collaborative care agreement  Meds ordered this encounter  Medications  . azithromycin (ZITHROMAX) 250 MG tablet    Sig: Take 2  tablets po on day one. Take one tablet po daily on days two through ten for upper respiratory infection.    Dispense:  11 tablet    Refill:  0    Order Specific Question:   Supervising Provider    Answer:   [name] [id number]  . fluconazole (DIFLUCAN) 150 MG tablet    Sig: Take 1 tablet po once. May repeat dose in 3 days as needed for persistent symptoms.    Dispense:  3 tablet    Refill:  1    Order Specific Question:   Supervising Provider    Answer:   [name] [id number]    Time spent: 14 Minutes    Dr [name] Internal medicine

## 2019-06-24 ENCOUNTER — Ambulatory Visit (INDEPENDENT_AMBULATORY_CARE_PROVIDER_SITE_OTHER): Payer: BC Managed Care – PPO | Admitting: Nurse Practitioner

## 2019-06-24 ENCOUNTER — Other Ambulatory Visit: Payer: Self-pay

## 2019-06-24 ENCOUNTER — Encounter: Payer: Self-pay | Admitting: Nurse Practitioner

## 2019-06-24 VITALS — BP 90/68 | HR 62 | Temp 97.2°F | Resp 16 | Ht 64.0 in | Wt 172.0 lb

## 2019-06-24 DIAGNOSIS — D3613 Benign neoplasm of peripheral nerves and autonomic nervous system of lower limb, including hip: Secondary | ICD-10-CM

## 2019-06-24 DIAGNOSIS — G43909 Migraine, unspecified, not intractable, without status migrainosus: Secondary | ICD-10-CM | POA: Diagnosis not present

## 2019-06-24 DIAGNOSIS — Z0001 Encounter for general adult medical examination with abnormal findings: Secondary | ICD-10-CM

## 2019-06-24 DIAGNOSIS — R300 Dysuria: Secondary | ICD-10-CM | POA: Diagnosis not present

## 2019-06-24 DIAGNOSIS — K219 Gastro-esophageal reflux disease without esophagitis: Secondary | ICD-10-CM

## 2019-06-24 DIAGNOSIS — R635 Abnormal weight gain: Secondary | ICD-10-CM | POA: Diagnosis not present

## 2019-06-24 MED ORDER — PHENTERMINE HCL 37.5 MG PO TABS: 37.5000 mg | ORAL_TABLET | Freq: Every day | ORAL | 1 refills | Status: DC

## 2019-06-24 MED ORDER — PANTOPRAZOLE SODIUM 40 MG PO TBEC: 40.0000 mg | DELAYED_RELEASE_TABLET | Freq: Two times a day (BID) | ORAL | 3 refills | Status: DC

## 2019-06-24 MED ORDER — SUMATRIPTAN SUCCINATE 100 MG PO TABS: 100.0000 mg | ORAL_TABLET | Freq: Every day | ORAL | 3 refills | Status: DC | PRN

## 2019-06-24 NOTE — Progress Notes (Signed)
[facility] [place], [place] [address]  Internal MEDICINE  Office Visit Note  Patient Name: Andrea Bernard  [id number]  [id number]  Date of Service: 07/03/2019    Pt is here for routine health maintenance examination  Chief Complaint  Patient presents with  . Annual Exam  . Gastroesophageal Reflux  . Arthritis  . Anemia  . Chest Congestion  . Mass    at the bottom of her foot will need referral     The patient is here for health maintenance exam. The patient is concerned about 12 pound weight gain over the past few months. Feels like she has "eaten her way" through pandemic. In the past, when she has gained weight, she has taken phentermine in the past which has helped her lose weight and helps her have less pain overall.  She has developed neurofibroma on the bottom of her foot. Hurts when she steps down on it. Finds herself walking on the outer aspect of the right foot and causing her to have pain up through her right hip.  Toenail fungus in both big toes. Has been present for several months. Stripe of black across both big toenails.  Migraine headaches. Have been worse since she has stopped having periods. Has had prescription for imitrex in the past which has helped a lot. Needs to have a new prescription.     Current Medication: Outpatient Encounter Medications as of 06/24/2019  Medication Sig  . acetaminophen (TYLENOL) 325 MG tablet Take 650 mg by mouth.  . famotidine (PEPCID) 20 MG tablet Take 1 tablet (20 mg total) by mouth daily.  . fluconazole (DIFLUCAN) 150 MG tablet Take 1 tablet po once. May repeat dose in 3 days as needed for persistent symptoms.  [name] gabapentin (NEURONTIN) 300 MG capsule Take 300 mg by mouth 3 (three) times daily.  . OXYCONTIN 80 MG 12 hr tablet Take 80 mg by mouth every 12 (twelve) hours.  . pantoprazole (PROTONIX) 40 MG tablet Take 1 tablet (40 mg total) by mouth 2 (two) times daily.  . SUMAtriptan (IMITREX) 100 MG  tablet Take 1 tablet (100 mg total) by mouth daily as needed for migraine.  . [DISCONTINUED] azithromycin (ZITHROMAX) 250 MG tablet Take 2 tablets po on day one. Take one tablet po daily on days two through ten for upper respiratory infection.  . [DISCONTINUED] pantoprazole (PROTONIX) 40 MG tablet Take 1 tablet (40 mg total) by mouth 2 (two) times daily.  . [DISCONTINUED] phenazopyridine (PYRIDIUM) 200 MG tablet Take 1 tablet (200 mg total) by mouth 3 (three) times daily as needed for pain.  . [DISCONTINUED] sulfamethoxazole-trimethoprim (BACTRIM DS) 800-160 MG tablet Take 1 tablet by mouth 2 (two) times daily.  . [DISCONTINUED] SUMAtriptan (IMITREX) 100 MG tablet Take 100 mg by mouth daily as needed.  [name] estradiol-norethindrone (ACTIVELLA) 1-0.5 MG tablet Take 1 tablet by mouth daily.  . phentermine (ADIPEX-P) 37.5 MG tablet Take 1 tablet (37.5 mg total) by mouth daily before breakfast.   Facility-Administered Encounter Medications as of 06/24/2019  Medication  . iron sucrose (VENOFER) 200 mg IVPB  . sodium chloride flush (NS) 0.9 % injection 10 mL    Surgical History: Past Surgical History:  Procedure Laterality Date  . carpel tunnel release    . CESAREAN SECTION     x 4  . COLONOSCOPY WITH PROPOFOL N/A 09/04/2017   Procedure: COLONOSCOPY WITH PROPOFOL;  Surgeon: [name], MD;  Location: [facility] ENDOSCOPY;  Service: Gastroenterology;  Laterality: N/A;  .  COLONOSCOPY WITH PROPOFOL N/A 12/21/2017   Procedure: COLONOSCOPY WITH PROPOFOL;  Surgeon: [name], MD;  Location: [facility] ENDOSCOPY;  Service: Gastroenterology;  Laterality: N/A;  . ESOPHAGOGASTRODUODENOSCOPY (EGD) WITH PROPOFOL N/A 09/04/2017   Procedure: ESOPHAGOGASTRODUODENOSCOPY (EGD) WITH PROPOFOL;  Surgeon: [name], MD;  Location: [facility] ENDOSCOPY;  Service: Gastroenterology;  Laterality: N/A;  . HERNIA REPAIR    . TUBAL LIGATION    . TUMOR REMOVAL      Medical History: Past Medical History:  Diagnosis Date  . Arthritis    . GERD (gastroesophageal reflux disease)   . Headache   . Iron deficiency anemia 06/15/2017  . Neurofibromatosis ([place])     Family History: Family History  Problem Relation Age of Onset  . Anemia Mother   . Bladder Cancer Neg Hx   . Kidney cancer Neg Hx   . Breast cancer Neg Hx   . Ovarian cancer Neg Hx   . Colon cancer Neg Hx       Review of Systems  Constitutional: Positive for unexpected weight change. Negative for chills and fatigue.       Weight gain of 12 pounds since her last visit   HENT: Negative for congestion, postnasal drip, rhinorrhea, sneezing and sore throat.   Respiratory: Negative for cough, chest tightness, shortness of breath and wheezing.   Cardiovascular: Negative for chest pain and palpitations.  Gastrointestinal: Negative for abdominal pain, constipation, diarrhea, nausea and vomiting.  Endocrine: Negative for cold intolerance, heat intolerance, polydipsia and polyuria.  Musculoskeletal: Negative for arthralgias, back pain, joint swelling and neck pain.  Skin: Negative for rash.  Allergic/Immunologic: Negative for environmental allergies.  Neurological: Negative for dizziness, tremors, numbness and headaches.  Hematological: Negative for adenopathy. Does not bruise/bleed easily.  Psychiatric/Behavioral: Negative for behavioral problems (Depression), sleep disturbance and suicidal ideas. The patient is not nervous/anxious.      Today's Vitals   06/24/19 1504  BP: 90/68  Pulse: 62  Resp: 16  Temp: (!) 97.2 F (36.2 C)  SpO2: 96%  Weight: 172 lb (78 kg)  Height: 5\' 4"  (1.626 m)   Body mass index is 29.52 kg/m.  Physical Exam Vitals signs and nursing note reviewed.  Constitutional:      General: She is not in acute distress.    Appearance: Normal appearance. She is well-developed. She is not diaphoretic.  HENT:     Head: Normocephalic and atraumatic.     Mouth/Throat:     Pharynx: No oropharyngeal exudate.  Eyes:     Pupils: Pupils are  equal, round, and reactive to light.  Neck:     Musculoskeletal: Normal range of motion and neck supple.     Thyroid: No thyromegaly.     Vascular: No carotid bruit or JVD.     Trachea: No tracheal deviation.  Cardiovascular:     Rate and Rhythm: Normal rate and regular rhythm.     Pulses: Normal pulses.     Heart sounds: Normal heart sounds. No murmur. No friction rub. No gallop.   Pulmonary:     Effort: Pulmonary effort is normal. No respiratory distress.     Breath sounds: Normal breath sounds. No wheezing or rales.  Chest:     Chest wall: No tenderness.     Breasts:        Right: Normal. No swelling, bleeding, inverted nipple, mass, nipple discharge, skin change or tenderness.        Left: Normal. No swelling, bleeding, inverted nipple, mass, nipple discharge, skin change or  tenderness.     Comments: Multiple nodular densities in bilateral breasts. Patient with known history of neurofibromatosis.  Abdominal:     General: Bowel sounds are normal.     Palpations: Abdomen is soft.     Tenderness: There is no abdominal tenderness.  Musculoskeletal: Normal range of motion.     Comments: Visible and palpable neurofibroma on the plantar aspect of right foot. Tender with palpated and when applying weight on the foot.   Lymphadenopathy:     Cervical: No cervical adenopathy.  Skin:    General: Skin is warm and dry.  Neurological:     Mental Status: She is alert and oriented to person, place, and time.     Cranial Nerves: No cranial nerve deficit.  Psychiatric:        Behavior: Behavior normal.        Thought Content: Thought content normal.        Judgment: Judgment normal.   LABS: Recent Results (from the past 2160 hour(s))  UA/M w/rflx Culture, Routine     Status: Abnormal   Collection Time: 06/24/19  4:00 PM   Specimen: Urine   URINE  Result Value Ref Range   Specific Gravity, UA      >=1.030 (A) 1.005 - 1.030   pH, UA 5.0 5.0 - 7.5   Color, UA Yellow Yellow   Appearance  Ur Clear Clear   Leukocytes,UA Negative Negative   Protein,UA Negative Negative/Trace   Glucose, UA Negative Negative   Ketones, UA Negative Negative   RBC, UA Negative Negative   Bilirubin, UA Negative Negative   Urobilinogen, Ur 1.0 0.2 - 1.0 mg/dL   Nitrite, UA Negative Negative   Microscopic Examination Comment     Comment: Microscopic follows if indicated.   Microscopic Examination See below:     Comment: Microscopic was indicated and was performed.   Urinalysis Reflex Comment     Comment: This specimen will not reflex to a Urine Culture.  Microscopic Examination     Status: Abnormal   Collection Time: 06/24/19  4:00 PM   URINE  Result Value Ref Range   WBC, UA 0-5 0 - 5 /hpf   RBC 0-2 0 - 2 /hpf   Epithelial Cells (non renal) >10 (A) 0 - 10 /hpf   Casts None seen None seen /lpf   Crystals Present (A) N/A   Crystal Type Calcium Oxalate N/A   Mucus, UA Present Not Estab.   Bacteria, UA None seen None seen/Few    Assessment/Plan:  1. Encounter for general adult medical examination with abnormal findings Annual health maintenance exam todoay.   2. Neurofibroma of foot Will refer to podiatry for further evaluation.  - Ambulatory referral to Podiatry  3. Migraine syndrome Add imitrex 100mg  to take as needed for acute headaches. Reassess at next visit.  - SUMAtriptan (IMITREX) 100 MG tablet; Take 1 tablet (100 mg total) by mouth daily as needed for migraine.  Dispense: 9 tablet; Refill: 3  4. Abnormal weight gain Start phentermine 37.5mg  daily. Limit calorie intake to 1200 calories per day. Incorporate exercise into daily routine.  - phentermine (ADIPEX-P) 37.5 MG tablet; Take 1 tablet (37.5 mg total) by mouth daily before breakfast.  Dispense: 30 tablet; Refill: 1  5. Gastroesophageal reflux disease without esophagitis Pay continue to protonix 40mg  daily.  - pantoprazole (PROTONIX) 40 MG tablet; Take 1 tablet (40 mg total) by mouth 2 (two) times daily.  Dispense: 60  tablet; Refill: 3  6. Dysuria -  UA/M w/rflx Culture, Routine   General Counseling: Jabrea verbalizes understanding of the findings of todays visit and agrees with plan of treatment. I have discussed any further diagnostic evaluation that may be needed or ordered today. We also reviewed her medications today. she has been encouraged to call the office with any questions or concerns that should arise related to todays visit.    Counseling:   There is a liability release in patients' chart. There has been a 10 minute discussion about the side effects including but not limited to elevated blood pressure, anxiety, lack of sleep and dry mouth. Pt understands and will like to start/continue on appetite suppressant at this time. There will be one month RX given at the time of visit with proper follow up. [place] diet plan with restricted calories is given to the pt. Pt understands and agrees with  plan of treatment  This patient was seen by Leretha Pol FNP Collaboration with Dr [name] as a part of collaborative care agreement  Orders Placed This Encounter  Procedures  . Microscopic Examination  . UA/M w/rflx Culture, Routine  . Ambulatory referral to [place] ordered this encounter  Medications  . SUMAtriptan (IMITREX) 100 MG tablet    Sig: Take 1 tablet (100 mg total) by mouth daily as needed for migraine.    Dispense:  9 tablet    Refill:  3    Order Specific Question:   Supervising Provider    Answer:   [name] [id number]  . phentermine (ADIPEX-P) 37.5 MG tablet    Sig: Take 1 tablet (37.5 mg total) by mouth daily before breakfast.    Dispense:  30 tablet    Refill:  1    Order Specific Question:   Supervising Provider    Answer:   [name] [place]  . pantoprazole (PROTONIX) 40 MG tablet    Sig: Take 1 tablet (40 mg total) by mouth 2 (two) times daily.    Dispense:  60 tablet    Refill:  3    Order Specific Question:   Supervising Provider    Answer:    [name] [id number]    Time spent: [place], MD  Internal Medicine

## 2019-06-25 LAB — UA/M W/RFLX CULTURE, ROUTINE
Bilirubin, UA: NEGATIVE
Glucose, UA: NEGATIVE
Ketones, UA: NEGATIVE
Leukocytes,UA: NEGATIVE
Nitrite, UA: NEGATIVE
Protein,UA: NEGATIVE
RBC, UA: NEGATIVE
Specific Gravity, UA: >=1.03 — AB (ref 1.005–1.030)
Urobilinogen, Ur: 1 mg/dL (ref 0.2–1.0)
pH, UA: 5 (ref 5.0–7.5)

## 2019-06-25 LAB — MICROSCOPIC EXAMINATION
Bacteria, UA: NONE SEEN
Casts: NONE SEEN /lpf
Epithelial Cells (non renal): >10 /hpf — AB (ref 0–10)

## 2019-07-03 DIAGNOSIS — K219 Gastro-esophageal reflux disease without esophagitis: Secondary | ICD-10-CM | POA: Insufficient documentation

## 2019-07-03 DIAGNOSIS — G43909 Migraine, unspecified, not intractable, without status migrainosus: Secondary | ICD-10-CM | POA: Insufficient documentation

## 2019-07-03 DIAGNOSIS — D3613 Benign neoplasm of peripheral nerves and autonomic nervous system of lower limb, including hip: Secondary | ICD-10-CM | POA: Insufficient documentation

## 2019-07-08 ENCOUNTER — Ambulatory Visit: Payer: Self-pay | Admitting: Podiatry

## 2019-07-08 ENCOUNTER — Other Ambulatory Visit: Payer: Self-pay

## 2019-07-08 ENCOUNTER — Ambulatory Visit (INDEPENDENT_AMBULATORY_CARE_PROVIDER_SITE_OTHER): Payer: BC Managed Care – PPO

## 2019-07-08 ENCOUNTER — Ambulatory Visit (INDEPENDENT_AMBULATORY_CARE_PROVIDER_SITE_OTHER): Payer: BC Managed Care – PPO | Admitting: Podiatry

## 2019-07-08 DIAGNOSIS — M722 Plantar fascial fibromatosis: Secondary | ICD-10-CM

## 2019-07-08 DIAGNOSIS — Q8500 Neurofibromatosis, unspecified: Secondary | ICD-10-CM

## 2019-07-08 NOTE — Patient Instructions (Signed)
Pre-Operative Instructions  Congratulations, you have decided to take an important step towards improving your quality of life.  You can be assured that the doctors and staff at [facility] will be with you every step of the way.  Here are some important things you should know:  1. Plan to be at the surgery center/hospital at least 1 (one) hour prior to your scheduled time, unless otherwise directed by the surgical center/hospital staff.  You must have a responsible adult accompany you, remain during the surgery and drive you home.  Make sure you have directions to the surgical center/hospital to ensure you arrive on time. 2. If you are having surgery at [facility] or  hospitals, you will need a copy of your medical history and physical form from your family physician within one month prior to the date of surgery. We will give you a form for your primary physician to complete.  3. We make every effort to accommodate the date you request for surgery.  However, there are times where surgery dates or times have to be moved.  We will contact you as soon as possible if a change in schedule is required.   4. No aspirin/ibuprofen for one week before surgery.  If you are on aspirin, any non-steroidal anti-inflammatory medications (Mobic, Aleve, Ibuprofen) should not be taken seven (7) days prior to your surgery.  You make take Tylenol for pain prior to surgery.  5. Medications - If you are taking daily heart and blood pressure medications, seizure, reflux, allergy, asthma, anxiety, pain or diabetes medications, make sure you notify the surgery center/hospital before the day of surgery so they can tell you which medications you should take or avoid the day of surgery. 6. No food or drink after midnight the night before surgery unless directed otherwise by surgical center/hospital staff. 7. No alcoholic beverages 24-hours prior to surgery.  No smoking 24-hours prior or 24-hours after  surgery. 8. Wear loose pants or shorts. They should be loose enough to fit over bandages, boots, and casts. 9. Don't wear slip-on shoes. Sneakers are preferred. 10. Bring your boot with you to the surgery center/hospital.  Also bring crutches or a walker if your physician has prescribed it for you.  If you do not have this equipment, it will be provided for you after surgery. 11. If you have not been contacted by the surgery center/hospital by the day before your surgery, call to confirm the date and time of your surgery. 12. Leave-time from work may vary depending on the type of surgery you have.  Appropriate arrangements should be made prior to surgery with your employer. 13. Prescriptions will be provided immediately following surgery by your doctor.  Fill these as soon as possible after surgery and take the medication as directed. Pain medications will not be refilled on weekends and must be approved by the doctor. 14. Remove nail polish on the operative foot and avoid getting pedicures prior to surgery. 15. Wash the night before surgery.  The night before surgery wash the foot and leg well with water and the antibacterial soap provided. Be sure to pay special attention to beneath the toenails and in between the toes.  Wash for at least three (3) minutes. Rinse thoroughly with water and dry well with a towel.  Perform this wash unless told not to do so by your physician.  Enclosed: 1 Ice pack (please put in freezer the night before surgery)   1 Hibiclens skin cleaner     Pre-op instructions  If you have any questions regarding the instructions, please do not hesitate to call our office.  [place]: [address], [place], [place] [address] -- [phone]  [place]: [address] [place], [place] [address] -- [phone]  [place]: [address]  [place], [place] [address] -- [phone]   Website: [email or web address] 

## 2019-07-13 NOTE — Progress Notes (Signed)
   HPI: 53 y.o. female presenting today as a new patient with a chief complaint of constant pressure and burning pain of the right plantar arch that has been present for the past several months. Walking intermittently increases the pain. She has been taking Oxycontin and Motrin for pain. Patient is here for further evaluation and treatment.   Past Medical History:  Diagnosis Date  . Arthritis   . GERD (gastroesophageal reflux disease)   . Headache   . Iron deficiency anemia 06/15/2017  . Neurofibromatosis [facility])      Physical Exam: General: The patient is alert and oriented x3 in no acute distress.  Dermatology: Skin is warm, dry and supple bilateral lower extremities. Negative for open lesions or macerations.  Vascular: Palpable pedal pulses bilaterally. No edema or erythema noted. Capillary refill within normal limits.  Neurological: Epicritic and protective threshold grossly intact bilaterally.   Musculoskeletal Exam: Pain with palpation noted to the right plantar foot. Likely neurofibroma measuring 1 cm in diameter on the right plantar foot. Range of motion within normal limits to all pedal and ankle joints bilateral. Muscle strength 5/5 in all groups bilateral.   Radiographic Exam:  Normal osseous mineralization. Joint spaces preserved. No fracture/dislocation/boney destruction.    Assessment: 1. Neurofibromatosis  2. Likely neurofibroma right plantar foot    Plan of Care:  1. Patient evaluated. X-Rays reviewed.  2. Today we discussed the conservative versus surgical management of the presenting pathology. The patient opts for surgical management. All possible complications and details of the procedure were explained. All patient questions were answered. No guarantees were expressed or implied. 3. Authorization for in-office surgery was initiated today. In-office surgery will consist of excision of soft tissue mass right foot.  4. Return to clinic morning of in-office surgery  in [place].       Edrick Kins, DPM [facility]  Dr. Edrick Kins, DPM    [address], [place] [address]                Office [phone]  Fax [phone]

## 2019-07-22 ENCOUNTER — Ambulatory Visit: Payer: BC Managed Care – PPO | Admitting: Nurse Practitioner

## 2019-07-29 DIAGNOSIS — Z0001 Encounter for general adult medical examination with abnormal findings: Secondary | ICD-10-CM | POA: Diagnosis not present

## 2019-07-29 DIAGNOSIS — R5383 Other fatigue: Secondary | ICD-10-CM | POA: Diagnosis not present

## 2019-07-29 DIAGNOSIS — D509 Iron deficiency anemia, unspecified: Secondary | ICD-10-CM | POA: Diagnosis not present

## 2019-07-29 DIAGNOSIS — E559 Vitamin D deficiency, unspecified: Secondary | ICD-10-CM | POA: Diagnosis not present

## 2019-08-03 ENCOUNTER — Telehealth: Payer: Self-pay | Admitting: *Deleted

## 2019-08-03 LAB — COMPREHENSIVE METABOLIC PANEL
ALT: 15 IU/L (ref 0–32)
AST: 18 IU/L (ref 0–40)
Albumin/Globulin Ratio: 1.8 (ref 1.2–2.2)
Albumin: 4.6 g/dL (ref 3.8–4.9)
Alkaline Phosphatase: 121 IU/L — ABNORMAL HIGH (ref 39–117)
BUN/Creatinine Ratio: 18 (ref 9–23)
BUN: 16 mg/dL (ref 6–24)
Bilirubin Total: 0.3 mg/dL (ref 0.0–1.2)
CO2: 26 mmol/L (ref 20–29)
Calcium: 9.7 mg/dL (ref 8.7–10.2)
Chloride: 100 mmol/L (ref 96–106)
Creatinine, Ser: 0.89 mg/dL (ref 0.57–1.00)
GFR calc Af Amer: 86 mL/min/{1.73_m2} (ref >59)
GFR calc non Af Amer: 74 mL/min/{1.73_m2} (ref >59)
Globulin, Total: 2.6 g/dL (ref 1.5–4.5)
Glucose: 92 mg/dL (ref 65–99)
Potassium: 4.2 mmol/L (ref 3.5–5.2)
Sodium: 139 mmol/L (ref 134–144)
Total Protein: 7.2 g/dL (ref 6.0–8.5)

## 2019-08-03 LAB — IRON,TIBC AND FERRITIN PANEL
Ferritin: 39 ng/mL (ref 15–150)
Iron Saturation: 19 % (ref 15–55)
Iron: 60 ug/dL (ref 27–159)
Total Iron Binding Capacity: 316 ug/dL (ref 250–450)
UIBC: 256 ug/dL (ref 131–425)

## 2019-08-03 LAB — CBC WITH DIFFERENTIAL/PLATELET
Basophils Absolute: 0.1 10*3/uL (ref 0.0–0.2)
Basos: 1 %
EOS (ABSOLUTE): 0.3 10*3/uL (ref 0.0–0.4)
Eos: 6 %
Hematocrit: 41.7 % (ref 34.0–46.6)
Hemoglobin: 14.4 g/dL (ref 11.1–15.9)
Immature Grans (Abs): 0 10*3/uL (ref 0.0–0.1)
Immature Granulocytes: 0 %
Lymphocytes Absolute: 2 10*3/uL (ref 0.7–3.1)
Lymphs: 35 %
MCH: 28.7 pg (ref 26.6–33.0)
MCHC: 34.5 g/dL (ref 31.5–35.7)
MCV: 83 fL (ref 79–97)
Monocytes Absolute: 0.5 10*3/uL (ref 0.1–0.9)
Monocytes: 9 %
Neutrophils Absolute: 2.7 10*3/uL (ref 1.4–7.0)
Neutrophils: 49 %
Platelets: 263 10*3/uL (ref 150–450)
RBC: 5.01 x10E6/uL (ref 3.77–5.28)
RDW: 12.1 % (ref 11.7–15.4)
WBC: 5.6 10*3/uL (ref 3.4–10.8)

## 2019-08-03 LAB — LIPID PANEL
Chol/HDL Ratio: 3 ratio (ref 0.0–4.4)
Cholesterol, Total: 221 mg/dL — ABNORMAL HIGH (ref 100–199)
HDL: 73 mg/dL (ref >39)
LDL Chol Calc (NIH): 137 mg/dL — ABNORMAL HIGH (ref 0–99)
Triglycerides: 61 mg/dL (ref 0–149)
VLDL Cholesterol Cal: 11 mg/dL (ref 5–40)

## 2019-08-03 LAB — VITAMIN D 1,25 DIHYDROXY
Vitamin D 1, 25 (OH)2 Total: 63 pg/mL
Vitamin D2 1, 25 (OH)2: <10 pg/mL
Vitamin D3 1, 25 (OH)2: 63 pg/mL

## 2019-08-03 LAB — FOLATE: Folate: 4.3 ng/mL (ref >3.0)

## 2019-08-03 LAB — T4, FREE: Free T4: 1.45 ng/dL (ref 0.82–1.77)

## 2019-08-03 LAB — VITAMIN B12: Vitamin B-12: 754 pg/mL (ref 232–1245)

## 2019-08-03 LAB — TSH: TSH: 0.47 u[IU]/mL (ref 0.450–4.500)

## 2019-08-03 NOTE — Telephone Encounter (Signed)
I am returning your call.  You want to schedule your surgery?  "Yes, I do.  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  I need to make sure I am able to get these kids there toys before I have my surgery.  When can he do it right before Christmas or the week after Christmas?"  Dr. Amalia Hailey can do it on December 16 or December 30.  "Let's schedule it for December 30.  What do I need to do?"  You need to arrive at the [place] office at 7:45 am.  "What's the address there, I've never been there."  The address is [address].  "I'll be there on December 30 at 7:45 am."

## 2019-08-03 NOTE — Telephone Encounter (Signed)
"  Can you please call me back.  I was given your card by the doctor."

## 2019-08-07 NOTE — Progress Notes (Signed)
Review with patient at visit 11/172020

## 2019-08-15 ENCOUNTER — Encounter: Payer: Self-pay | Admitting: Internal Medicine

## 2019-08-15 NOTE — Telephone Encounter (Signed)
Hey. Can you call her and change her appointment? Thanks.

## 2019-08-19 ENCOUNTER — Ambulatory Visit: Payer: BC Managed Care – PPO | Admitting: Nurse Practitioner

## 2019-09-14 ENCOUNTER — Telehealth: Payer: Self-pay

## 2019-09-14 NOTE — Telephone Encounter (Signed)
CONFIRMED AS A VIRTUAL VISIT 09-16-19.

## 2019-09-16 ENCOUNTER — Encounter: Payer: Self-pay | Admitting: Nurse Practitioner

## 2019-09-16 ENCOUNTER — Ambulatory Visit: Payer: BC Managed Care – PPO | Admitting: Nurse Practitioner

## 2019-09-16 VITALS — Ht 64.0 in | Wt 152.0 lb

## 2019-09-16 DIAGNOSIS — M064 Inflammatory polyarthropathy: Secondary | ICD-10-CM | POA: Diagnosis not present

## 2019-09-16 DIAGNOSIS — K219 Gastro-esophageal reflux disease without esophagitis: Secondary | ICD-10-CM

## 2019-09-16 DIAGNOSIS — R635 Abnormal weight gain: Secondary | ICD-10-CM

## 2019-09-16 MED ORDER — PANTOPRAZOLE SODIUM 40 MG PO TBEC: 40.0000 mg | DELAYED_RELEASE_TABLET | Freq: Two times a day (BID) | ORAL | 5 refills | Status: DC

## 2019-09-16 MED ORDER — PHENTERMINE HCL 37.5 MG PO TABS: 37.5000 mg | ORAL_TABLET | Freq: Every day | ORAL | 1 refills | Status: DC

## 2019-09-16 NOTE — Progress Notes (Signed)
[facility] [place],  [address]  Internal MEDICINE  Telephone Visit  Patient Name: Andrea Bernard  [id number]  [id number]  Date of Service: 09/18/2019  I connected with the patient at 4:25pm by webcam and verified the patients identity using two identifiers.   I discussed the limitations, risks, security and privacy concerns of performing an evaluation and management service by webcam and the availability of in person appointments. I also discussed with the patient that there may be a patient responsible charge related to the service.  The patient expressed understanding and agrees to proceed.    Chief Complaint  Patient presents with  . Follow-up    review labs, hernia  . Arthritis    hands hurt to make a fist, knees hurt going up the stairs, inflammation throughout entire body    The patient has been contacted via webcam for follow up visit due to concerns for spread of novel coronavirus.  -generalized joint pain - wrists, knees, feet, elbows, low back. Does have neurofibromatosis, however, this pain is different than usual pain she deals with.  -review labs. Mild elevation of LDL and total cholesterol. Other labs, including full anemia panel were normal.  -doing well on phentermine and would like to continue for another month or two.       Current Medication: Outpatient Encounter Medications as of 09/16/2019  Medication Sig  . acetaminophen (TYLENOL) 325 MG tablet Take 650 mg by mouth.  [name] amitriptyline (ELAVIL) 25 MG tablet Take by mouth.  . cyclobenzaprine (FLEXERIL) 10 MG tablet Take 10 mg by mouth daily.  . famotidine (PEPCID) 20 MG tablet Take 1 tablet (20 mg total) by mouth daily.  . fluconazole (DIFLUCAN) 150 MG tablet Take 1 tablet po once. May repeat dose in 3 days as needed for persistent symptoms.  [name] gabapentin (NEURONTIN) 300 MG capsule Take 300 mg by mouth 3 (three) times daily.  . OXYCONTIN 80 MG 12 hr tablet Take 80 mg by  mouth every 12 (twelve) hours.  . pantoprazole (PROTONIX) 40 MG tablet Take 1 tablet (40 mg total) by mouth 2 (two) times daily.  . phentermine (ADIPEX-P) 37.5 MG tablet Take 1 tablet (37.5 mg total) by mouth daily before breakfast.  . SUMAtriptan (IMITREX) 100 MG tablet Take 1 tablet (100 mg total) by mouth daily as needed for migraine.  . [DISCONTINUED] pantoprazole (PROTONIX) 40 MG tablet Take 1 tablet (40 mg total) by mouth 2 (two) times daily.  . [DISCONTINUED] phentermine (ADIPEX-P) 37.5 MG tablet Take 1 tablet (37.5 mg total) by mouth daily before breakfast.  . estradiol-norethindrone (ACTIVELLA) 1-0.5 MG tablet Take 1 tablet by mouth daily.   Facility-Administered Encounter Medications as of 09/16/2019  Medication  . iron sucrose (VENOFER) 200 mg IVPB  . sodium chloride flush (NS) 0.9 % injection 10 mL    Surgical History: Past Surgical History:  Procedure Laterality Date  . carpel tunnel release    . CESAREAN SECTION     x 4  . COLONOSCOPY WITH PROPOFOL N/A 09/04/2017   Procedure: COLONOSCOPY WITH PROPOFOL;  Surgeon: [name], MD;  Location: [facility] ENDOSCOPY;  Service: Gastroenterology;  Laterality: N/A;  . COLONOSCOPY WITH PROPOFOL N/A 12/21/2017   Procedure: COLONOSCOPY WITH PROPOFOL;  Surgeon: [name], MD;  Location: [facility] ENDOSCOPY;  Service: Gastroenterology;  Laterality: N/A;  . ESOPHAGOGASTRODUODENOSCOPY (EGD) WITH PROPOFOL N/A 09/04/2017   Procedure: ESOPHAGOGASTRODUODENOSCOPY (EGD) WITH PROPOFOL;  Surgeon: [name], MD;  Location: [facility] ENDOSCOPY;  Service: Gastroenterology;  Laterality:  N/A;  . HERNIA REPAIR    . TUBAL LIGATION    . TUMOR REMOVAL      Medical History: Past Medical History:  Diagnosis Date  . Arthritis   . GERD (gastroesophageal reflux disease)   . Headache   . Iron deficiency anemia 06/15/2017  . Neurofibromatosis ([place])     Family History: Family History  Problem Relation Age of Onset  . Anemia Mother   . Bladder Cancer Neg Hx   .  Kidney cancer Neg Hx   . Breast cancer Neg Hx   . Ovarian cancer Neg Hx   . Colon cancer Neg Hx     Social History   Socioeconomic History  . Marital status: Married    Spouse name: Not on file  . Number of children: Not on file  . Years of education: Not on file  . Highest education level: Not on file  Occupational History  . Not on file  Tobacco Use  . Smoking status: Never Smoker  . Smokeless tobacco: Never Used  Substance and Sexual Activity  . Alcohol use: No  . Drug use: Not Currently  . Sexual activity: Yes    Birth control/protection: Surgical  Other Topics Concern  . Not on file  Social History Narrative  . Not on file   Social Determinants of Health   Financial Resource Strain:   . Difficulty of Paying Living Expenses: Not on file  Food Insecurity:   . Worried About Charity fundraiser in the Last Year: Not on file  . Ran Out of Food in the Last Year: Not on file  Transportation Needs:   . Lack of Transportation (Medical): Not on file  . Lack of Transportation (Non-Medical): Not on file  Physical Activity:   . Days of Exercise per Week: Not on file  . Minutes of Exercise per Session: Not on file  Stress:   . Feeling of Stress : Not on file  Social Connections:   . Frequency of Communication with Friends and Family: Not on file  . Frequency of Social Gatherings with Friends and Family: Not on file  . Attends Religious Services: Not on file  . Active Member of Clubs or Organizations: Not on file  . Attends Archivist Meetings: Not on file  . Marital Status: Not on file  Intimate Partner Violence:   . Fear of Current or Ex-Partner: Not on file  . Emotionally Abused: Not on file  . Physically Abused: Not on file  . Sexually Abused: Not on file      Review of Systems  Constitutional: Positive for unexpected weight change. Negative for chills and fatigue.       Approximately 20 pound weight loss since her last visit in 06/2019  HENT:  Negative for congestion, postnasal drip, rhinorrhea, sneezing and sore throat.   Respiratory: Negative for cough, chest tightness, shortness of breath and wheezing.   Cardiovascular: Negative for chest pain and palpitations.  Gastrointestinal: Negative for abdominal pain, constipation, diarrhea, nausea and vomiting.  Endocrine: Negative for cold intolerance, heat intolerance, polydipsia and polyuria.  Musculoskeletal: Positive for arthralgias, back pain, joint swelling and myalgias. Negative for neck pain.  Skin: Negative for rash.  Allergic/Immunologic: Negative for environmental allergies.  Neurological: Negative for dizziness, tremors, numbness and headaches.  Hematological: Negative for adenopathy. Does not bruise/bleed easily.  Psychiatric/Behavioral: Negative for behavioral problems (Depression), sleep disturbance and suicidal ideas. The patient is not nervous/anxious.     Today's Vitals  09/16/19 1559  Weight: 152 lb (68.9 kg)  Height: 5\' 4"  (1.626 m)   Body mass index is 26.09 kg/m.  Observation/Objective:   The patient is alert and oriented. She is pleasant and answers all questions appropriately. Breathing is non-labored. She is in no acute distress at this time.    Assessment/Plan: 1. Gastroesophageal reflux disease without esophagitis Generally well managed. Continue pantoprazole as previously prescribed  - pantoprazole (PROTONIX) 40 MG tablet; Take 1 tablet (40 mg total) by mouth 2 (two) times daily.  Dispense: 60 tablet; Refill: 5  2. Inflammatory polyarthropathy ([place]) Will check connective tissue panel for further evaluation. Refer to rheumatology as indicated.  - ANA w/Reflex if Positive - Rheumatoid factor - Sedimentation rate - Uric acid - CBC with Differential/Platelet  3. Abnormal weight gain May continue phentermine for additional 30 days, limit calorie intake to 1200 calories per day and participate in regular physical activity.  - phentermine  (ADIPEX-P) 37.5 MG tablet; Take 1 tablet (37.5 mg total) by mouth daily before breakfast.  Dispense: 30 tablet; Refill: 1  General Counseling: Andrea Bernard verbalizes understanding of the findings of today's phone visit and agrees with plan of treatment. I have discussed any further diagnostic evaluation that may be needed or ordered today. We also reviewed her medications today. she has been encouraged to call the office with any questions or concerns that should arise related to todays visit.   There is a liability release in patients' chart. There has been a 10 minute discussion about the side effects including but not limited to elevated blood pressure, anxiety, lack of sleep and dry mouth. Pt understands and will like to start/continue on appetite suppressant at this time. There will be one month RX given at the time of visit with proper follow up. [place] diet plan with restricted calories is given to the pt. Pt understands and agrees with  plan of treatment  This patient was seen by Leretha Pol FNP Collaboration with Dr [name] as a part of collaborative care agreement  Orders Placed This Encounter  Procedures  . ANA w/Reflex if Positive  . Rheumatoid factor  . Sedimentation rate  . Uric acid  . CBC with Differential/Platelet    Meds ordered this encounter  Medications  . phentermine (ADIPEX-P) 37.5 MG tablet    Sig: Take 1 tablet (37.5 mg total) by mouth daily before breakfast.    Dispense:  30 tablet    Refill:  1    Order Specific Question:   Supervising Provider    Answer:   [name] [place]  . pantoprazole (PROTONIX) 40 MG tablet    Sig: Take 1 tablet (40 mg total) by mouth 2 (two) times daily.    Dispense:  60 tablet    Refill:  5    Order Specific Question:   Supervising Provider    Answer:   [name] [id number]    Time spent: 62 Minutes    Dr [name] Internal medicine

## 2019-09-18 DIAGNOSIS — M064 Inflammatory polyarthropathy: Secondary | ICD-10-CM | POA: Insufficient documentation

## 2019-09-23 ENCOUNTER — Telehealth: Payer: Self-pay | Admitting: *Deleted

## 2019-09-23 NOTE — Telephone Encounter (Signed)
DOS 10/05/2019 EXCISION GANGLION / TUMOR RIGHT FOOT - 28090  BCBS: Eligibility Date - 02/04/2016 - 10/05/9998   In-Network    Max Per Benefit Period Year-to-Date Remaining  CoInsurance     Deductible  $150.00 $0.00  Out-Of-Pocket 3  $3,000.00 [id number]   In-Network  [place] Coinsurance Authorization Required  $20 per Visit  Not Applicable  No  Messages: SPECIALIST

## 2019-09-26 ENCOUNTER — Other Ambulatory Visit: Payer: Self-pay | Admitting: Nurse Practitioner

## 2019-09-26 ENCOUNTER — Telehealth: Payer: Self-pay

## 2019-09-26 DIAGNOSIS — G43909 Migraine, unspecified, not intractable, without status migrainosus: Secondary | ICD-10-CM

## 2019-09-26 MED ORDER — SUMATRIPTAN SUCCINATE 100 MG PO TABS: 100.0000 mg | ORAL_TABLET | Freq: Every day | ORAL | 3 refills | Status: DC | PRN

## 2019-09-26 NOTE — Telephone Encounter (Signed)
Refilled sumatriptan and sent to [facility]

## 2019-09-26 NOTE — Progress Notes (Signed)
Refilled sumatriptan and sent to [facility]

## 2019-09-28 ENCOUNTER — Other Ambulatory Visit: Payer: Self-pay

## 2019-09-28 MED ORDER — FAMOTIDINE 20 MG PO TABS: 20.0000 mg | ORAL_TABLET | Freq: Every day | ORAL | 3 refills | Status: DC

## 2019-10-04 ENCOUNTER — Telehealth: Payer: Self-pay | Admitting: *Deleted

## 2019-10-04 NOTE — Telephone Encounter (Signed)
"  I received a message from [place], so I'm calling you back."  I didn't call you.  "I received a text message on my phone that said I am scheduled for surgery on tomorrow.  I need to be there at 7:45 am and it said I need to make sure I wore my mask."  The text was just a reminder of your appointment.  You need to arrive at 7:45 am.  It wasn't asking you to call and confirm.  "Okay, I have some questions I'd like to ask.  Can you answer them for me or do I need to speak to someone else?"  I can answer your questions.  "Will I be put to sleep?"  You will not be put to sleep.  You will only receive a numbing injection around the area that's being treated.  "Okay, that's what I wanted to know.  I have someone bringing me.  I'll see you tomorrow.  Thanks for your help."

## 2019-10-05 ENCOUNTER — Encounter: Payer: Self-pay | Admitting: Podiatry

## 2019-10-05 ENCOUNTER — Ambulatory Visit (INDEPENDENT_AMBULATORY_CARE_PROVIDER_SITE_OTHER): Payer: BC Managed Care – PPO | Admitting: Podiatry

## 2019-10-05 ENCOUNTER — Other Ambulatory Visit: Payer: Self-pay

## 2019-10-05 ENCOUNTER — Encounter: Payer: Self-pay | Admitting: Adult Health

## 2019-10-05 ENCOUNTER — Ambulatory Visit: Payer: BC Managed Care – PPO | Admitting: Adult Health

## 2019-10-05 VITALS — BP 123/75 | HR 66 | Temp 96.9°F | Resp 16

## 2019-10-05 DIAGNOSIS — B373 Candidiasis of vulva and vagina: Secondary | ICD-10-CM

## 2019-10-05 DIAGNOSIS — M722 Plantar fascial fibromatosis: Secondary | ICD-10-CM

## 2019-10-05 DIAGNOSIS — J988 Other specified respiratory disorders: Secondary | ICD-10-CM | POA: Diagnosis not present

## 2019-10-05 DIAGNOSIS — B3731 Acute candidiasis of vulva and vagina: Secondary | ICD-10-CM

## 2019-10-05 MED ORDER — AZITHROMYCIN 250 MG PO TABS: ORAL_TABLET | ORAL | 0 refills | Status: DC

## 2019-10-05 MED ORDER — FLUCONAZOLE 150 MG PO TABS: 150.0000 mg | ORAL_TABLET | ORAL | 0 refills | Status: DC | PRN

## 2019-10-05 NOTE — Progress Notes (Signed)
[facility] [place], [place] [address]  Internal MEDICINE  Telephone Visit  Patient Name: Andrea Bernard  [id number]  [id number]  Date of Service: 10/05/2019  I connected with the patient at 1105 by telephone and verified the patients identity using two identifiers.   I discussed the limitations, risks, security and privacy concerns of performing an evaluation and management service by telephone and the availability of in person appointments. I also discussed with the patient that there may be a patient responsible charge related to the service.  The patient expressed understanding and agrees to proceed.    Chief Complaint  Patient presents with  . Telephone Assessment  . Telephone Screen    pt had covid around thanksgiving   . Sinusitis  . Cough  . Wheezing    mucus  yellow     HPI  Pt seen via telephone. Pt reports a few weeks ago her nephew tested negative for covid but did have a cold. He was around her for awhile.  She reports about 10 days of sinus symptoms, cough, and hoarseness. She denies fever, or fatigue.  She had covid in mid November herself.  She reports getting this every year with sinus that moves into her chest.    Current Medication: Outpatient Encounter Medications as of 10/05/2019  Medication Sig  . acetaminophen (TYLENOL) 325 MG tablet Take 650 mg by mouth.  [name] amitriptyline (ELAVIL) 25 MG tablet Take by mouth.  . cyclobenzaprine (FLEXERIL) 10 MG tablet Take 10 mg by mouth daily.  . famotidine (PEPCID) 20 MG tablet Take 1 tablet (20 mg total) by mouth daily.  . fluconazole (DIFLUCAN) 150 MG tablet Take 1 tablet po once. May repeat dose in 3 days as needed for persistent symptoms.  [name] gabapentin (NEURONTIN) 300 MG capsule Take 300 mg by mouth 3 (three) times daily.  . OXYCONTIN 80 MG 12 hr tablet Take 80 mg by mouth every 12 (twelve) hours.  . pantoprazole (PROTONIX) 40 MG tablet Take 1 tablet (40 mg total) by mouth 2 (two) times  daily.  . phentermine (ADIPEX-P) 37.5 MG tablet Take 1 tablet (37.5 mg total) by mouth daily before breakfast.  . SUMAtriptan (IMITREX) 100 MG tablet Take 1 tablet (100 mg total) by mouth daily as needed for migraine.  [name] azithromycin (ZITHROMAX) 250 MG tablet Take two tablets PO on day one, and then take one tablet on days 2-10.  [name] estradiol-norethindrone (ACTIVELLA) 1-0.5 MG tablet Take 1 tablet by mouth daily.  . fluconazole (DIFLUCAN) 150 MG tablet Take 1 tablet (150 mg total) by mouth every three (3) days as needed.   Facility-Administered Encounter Medications as of 10/05/2019  Medication  . iron sucrose (VENOFER) 200 mg IVPB  . sodium chloride flush (NS) 0.9 % injection 10 mL    Surgical History: Past Surgical History:  Procedure Laterality Date  . carpel tunnel release    . CESAREAN SECTION     x 4  . COLONOSCOPY WITH PROPOFOL N/A 09/04/2017   Procedure: COLONOSCOPY WITH PROPOFOL;  Surgeon: [name], MD;  Location[facility] ENDOSCOPY;  Service: Gastroenterology;  Laterality: N/A;  . COLONOSCOPY WITH PROPOFOL N/A 12/21/2017   Procedure: COLONOSCOPY WITH PROPOFOL;  Surgeon: [name], MD;  Location: [facility] ENDOSCOPY;  Service: Gastroenterology;  Laterality: N/A;  . ESOPHAGOGASTRODUODENOSCOPY (EGD) WITH PROPOFOL N/A 09/04/2017   Procedure: ESOPHAGOGASTRODUODENOSCOPY (EGD) WITH PROPOFOL;  Surgeon: [name], MD;  Location: [facility] ENDOSCOPY;  Service: Gastroenterology;  Laterality: N/A;  . HERNIA REPAIR    .  TUBAL LIGATION    . TUMOR REMOVAL      Medical History: Past Medical History:  Diagnosis Date  . Arthritis   . GERD (gastroesophageal reflux disease)   . Headache   . Iron deficiency anemia 06/15/2017  . Neurofibromatosis ([place])     Family History: Family History  Problem Relation Age of Onset  . Anemia Mother   . Bladder Cancer Neg Hx   . Kidney cancer Neg Hx   . Breast cancer Neg Hx   . Ovarian cancer Neg Hx   . Colon cancer Neg Hx     Social History    Socioeconomic History  . Marital status: Married    Spouse name: Not on file  . Number of children: Not on file  . Years of education: Not on file  . Highest education level: Not on file  Occupational History  . Not on file  Tobacco Use  . Smoking status: Never Smoker  . Smokeless tobacco: Never Used  Substance and Sexual Activity  . Alcohol use: No  . Drug use: Not Currently  . Sexual activity: Yes    Birth control/protection: Surgical  Other Topics Concern  . Not on file  Social History Narrative  . Not on file   Social Determinants of Health   Financial Resource Strain:   . Difficulty of Paying Living Expenses: Not on file  Food Insecurity:   . Worried About Charity fundraiser in the Last Year: Not on file  . Ran Out of Food in the Last Year: Not on file  Transportation Needs:   . Lack of Transportation (Medical): Not on file  . Lack of Transportation (Non-Medical): Not on file  Physical Activity:   . Days of Exercise per Week: Not on file  . Minutes of Exercise per Session: Not on file  Stress:   . Feeling of Stress : Not on file  Social Connections:   . Frequency of Communication with Friends and Family: Not on file  . Frequency of Social Gatherings with Friends and Family: Not on file  . Attends Religious Services: Not on file  . Active Member of Clubs or Organizations: Not on file  . Attends Archivist Meetings: Not on file  . Marital Status: Not on file  Intimate Partner Violence:   . Fear of Current or Ex-Partner: Not on file  . Emotionally Abused: Not on file  . Physically Abused: Not on file  . Sexually Abused: Not on file      Review of Systems  Constitutional: Negative for chills, fatigue and unexpected weight change.  HENT: Positive for postnasal drip and sinus pressure. Negative for congestion, rhinorrhea, sneezing and sore throat.   Eyes: Negative for photophobia, pain and redness.  Respiratory: Negative for cough, chest tightness  and shortness of breath.   Cardiovascular: Negative for chest pain and palpitations.  Gastrointestinal: Negative for abdominal pain, constipation, diarrhea, nausea and vomiting.  Endocrine: Negative.   Genitourinary: Negative for dysuria and frequency.  Musculoskeletal: Negative for arthralgias, back pain, joint swelling and neck pain.  Skin: Negative for rash.  Allergic/Immunologic: Negative.   Neurological: Negative for tremors and numbness.  Hematological: Negative for adenopathy. Does not bruise/bleed easily.  Psychiatric/Behavioral: Negative for behavioral problems and sleep disturbance. The patient is not nervous/anxious.     Vital Signs: There were no vitals taken for this visit.   Observation/Objective: Hoarse voice noted, NAD at this time.     Assessment/Plan: 1. Respiratory infection Advised patient  to take entire course of antibiotics as prescribed with food. Pt should return to clinic in 7-10 days if symptoms fail to improve or new symptoms develop.  - azithromycin (ZITHROMAX) 250 MG tablet; Take two tablets PO on day one, and then take one tablet on days 2-10.  Dispense: 11 tablet; Refill: 0  2. Vaginal candida Take diflucan as directed with antibiotics.  - fluconazole (DIFLUCAN) 150 MG tablet; Take 1 tablet (150 mg total) by mouth every three (3) days as needed.  Dispense: 3 tablet; Refill: 0  General Counseling: Heera verbalizes understanding of the findings of today's phone visit and agrees with plan of treatment. I have discussed any further diagnostic evaluation that may be needed or ordered today. We also reviewed her medications today. she has been encouraged to call the office with any questions or concerns that should arise related to todays visit.    No orders of the defined types were placed in this encounter.   Meds ordered this encounter  Medications  . azithromycin (ZITHROMAX) 250 MG tablet    Sig: Take two tablets PO on day one, and then take one  tablet on days 2-10.    Dispense:  11 tablet    Refill:  0  . fluconazole (DIFLUCAN) 150 MG tablet    Sig: Take 1 tablet (150 mg total) by mouth every three (3) days as needed.    Dispense:  3 tablet    Refill:  0    Time spent:15 Minutes    Orson Gear AGNP-C Internal medicine

## 2019-10-06 ENCOUNTER — Other Ambulatory Visit: Payer: Self-pay | Admitting: Podiatry

## 2019-10-11 LAB — TISSUE SPECIMEN

## 2019-10-11 LAB — PATHOLOGY REPORT

## 2019-10-12 ENCOUNTER — Encounter: Payer: BC Managed Care – PPO | Admitting: Podiatry

## 2019-10-14 ENCOUNTER — Encounter: Payer: Self-pay | Admitting: Podiatry

## 2019-10-14 ENCOUNTER — Other Ambulatory Visit: Payer: Self-pay

## 2019-10-14 ENCOUNTER — Ambulatory Visit (INDEPENDENT_AMBULATORY_CARE_PROVIDER_SITE_OTHER): Payer: BC Managed Care – PPO | Admitting: Podiatry

## 2019-10-14 VITALS — BP 98/61 | HR 60 | Temp 98.6°F

## 2019-10-14 DIAGNOSIS — B351 Tinea unguium: Secondary | ICD-10-CM | POA: Diagnosis not present

## 2019-10-14 DIAGNOSIS — M722 Plantar fascial fibromatosis: Secondary | ICD-10-CM

## 2019-10-14 DIAGNOSIS — Z9889 Other specified postprocedural states: Secondary | ICD-10-CM

## 2019-10-14 MED ORDER — TERBINAFINE HCL 250 MG PO TABS: 250.0000 mg | ORAL_TABLET | Freq: Every day | ORAL | 2 refills | Status: DC

## 2019-10-15 NOTE — Progress Notes (Signed)
   OPERATIVE REPORT Patient name: Andrea Bernard MRN: [id number] DOB: November 10, 1965  DOS:  10/15/19  Preop Dx: Plantar fibroma right Postop Dx: same  Procedure:  1.  Excision of plantar fibroma right foot  Surgeon: Edrick Kins DPM  Anesthesia: 50-50 mixture of 2% lidocaine plain with 0.5% Marcaine plain totaling 6 mL infiltrated in the patient's right lower extremity  Hemostasis: Ankle tourniquet inflated to a pressure of 258mmHg after esmarch exsanguination   EBL: Minimal mL Materials: None Injectables: None Pathology: Plantar fibroma sent to pathology  Condition: The patient tolerated the procedure and anesthesia well. No complications noted or reported   Justification for procedure: The patient is a 54 y.o. female who presents today for surgical correction of a symptomatic plantar fibroma to the plantar aspect of the right foot. All conservative modalities of been unsuccessful in providing any sort of satisfactory alleviation of symptoms with the patient. The patient was told benefits as well as possible side effects of the surgery. The patient consented for surgical correction. The patient consent form was reviewed. All patient questions were answered. No guarantees were expressed or implied.    Procedure in Detail: The patient was brought to the procedure room, placed in the procedure chair in the supine position at which time an aseptic scrub and drape were performed about the patient's respective lower extremity after anesthesia was induced as described above. Attention was then directed to the surgical area where procedure number one commenced.  Procedure #1: Excision plantar fibroma right foot A 1.5 cm skin incision was made along the plantar arch of the right foot.  Incision was directly overlying the palpable fibroma.  Incision was carried down to the level of fibroma with care taken to retract away all small neurovascular structures traversing the incision site.  The  fibrous lesion was excised using a surgical #15 blade.  Care was taken to ensure that the entire lesion was removed.  Irrigation was then utilized and primary closure obtained using 4-0 nylon suture.   Dry sterile compressive dressings were then applied to all previously mentioned incision sites about the patient's lower extremity. The tourniquet which was used for hemostasis was deflated. All normal neurovascular responses including pink color and warmth returned all the digits of patient's lower extremity.  The patient was then discharged from the office with adequate prescriptions for analgesia. Verbal as well as written instructions were provided for the patient regarding wound care. The patient is to keep the dressings clean dry and intact until they are to follow surgeon Dr. Daylene Katayama in the office upon discharge.   Edrick Kins, DPM [facility]  Dr. Edrick Kins, [address]                                        [place], [place] [address]                Office [phone]  Fax [phone]

## 2019-10-18 NOTE — Progress Notes (Signed)
   Subjective:  Patient presents today status post fibroma excision right. DOS: 10/05/2019. She states she is doing well. She denies any significant pain or modifying factors. She has been using the post op shoe as directed. Patient is here for further evaluation and treatment.    Past Medical History:  Diagnosis Date  . Arthritis   . GERD (gastroesophageal reflux disease)   . Headache   . Iron deficiency anemia 06/15/2017  . Neurofibromatosis [facility])       Objective/Physical Exam Neurovascular status intact.  Skin incisions appear to be well coapted with sutures and staples intact. No sign of infectious process noted. No dehiscence. No active bleeding noted. Moderate edema noted to the surgical extremity. Hyperkeratotic, discolored, thickened, onychodystrophy noted to the bilateral great toenails.   Assessment: 1. s/p fibroma excision right. DOS: 10/05/2019 2. Onychomycosis bilateral great toes   Plan of Care:  1. Patient was evaluated.  2. Dressing changed.  3. Continue using post op shoe.  4. Prescription for Lamisil 250 mg #90 provided to patient.  5. Return to clinic in one week for suture removal.    Edrick Kins, DPM [facility]  Dr. Edrick Kins, [address]                                        [place], [place] [address]                Office [phone]  Fax [phone]

## 2019-10-19 ENCOUNTER — Encounter: Payer: BC Managed Care – PPO | Admitting: Podiatry

## 2019-10-21 ENCOUNTER — Ambulatory Visit (INDEPENDENT_AMBULATORY_CARE_PROVIDER_SITE_OTHER): Payer: BC Managed Care – PPO | Admitting: Podiatry

## 2019-10-21 ENCOUNTER — Other Ambulatory Visit: Payer: Self-pay

## 2019-10-21 DIAGNOSIS — M722 Plantar fascial fibromatosis: Secondary | ICD-10-CM

## 2019-10-23 NOTE — Progress Notes (Signed)
   Subjective:  Patient presents today status post fibroma excision right. DOS: 10/05/2019. She is also here for follow up evaluation of fungal great toenails. She states she is doing well. She denies any pain or modifying factors. She has been taking Lamisil and using the post op shoe as directed. Patient is here for further evaluation and treatment.    Past Medical History:  Diagnosis Date  . Arthritis   . GERD (gastroesophageal reflux disease)   . Headache   . Iron deficiency anemia 06/15/2017  . Neurofibromatosis [facility])       Objective/Physical Exam Neurovascular status intact.  Skin incisions appear to be well coapted with sutures and staples intact. No sign of infectious process noted. No dehiscence. No active bleeding noted. Moderate edema noted to the surgical extremity. Hyperkeratotic, discolored, thickened, onychodystrophy noted to the bilateral great toenails.   Assessment: 1. s/p fibroma excision right. DOS: 10/05/2019 2. Onychomycosis bilateral great toes   Plan of Care:  1. Patient was evaluated.  2. Sutures removed.  3. Continue taking Lamisil 250 mg #90 as prescribed.  4. Recommended good shoe gear.  5. Return to clinic as needed.     Edrick Kins, DPM [facility]  Dr. Edrick Kins, [address]                                        [place], [place] [address]                Office [phone]  Fax [phone]

## 2019-11-02 ENCOUNTER — Encounter: Payer: BC Managed Care – PPO | Admitting: Podiatry

## 2019-11-08 DIAGNOSIS — L988 Other specified disorders of the skin and subcutaneous tissue: Secondary | ICD-10-CM | POA: Diagnosis not present

## 2019-11-08 DIAGNOSIS — D2361 Other benign neoplasm of skin of right upper limb, including shoulder: Secondary | ICD-10-CM | POA: Diagnosis not present

## 2020-01-16 ENCOUNTER — Other Ambulatory Visit: Payer: Self-pay

## 2020-01-16 DIAGNOSIS — G43909 Migraine, unspecified, not intractable, without status migrainosus: Secondary | ICD-10-CM

## 2020-01-16 MED ORDER — SUMATRIPTAN SUCCINATE 100 MG PO TABS: 100.0000 mg | ORAL_TABLET | Freq: Every day | ORAL | 1 refills | Status: DC | PRN

## 2020-01-17 ENCOUNTER — Telehealth: Payer: Self-pay

## 2020-01-17 NOTE — Telephone Encounter (Signed)
Confirmed appointment on 01/19/2020 and screened for covid. klh

## 2020-01-19 ENCOUNTER — Ambulatory Visit: Payer: BC Managed Care – PPO | Admitting: Nurse Practitioner

## 2020-01-31 ENCOUNTER — Telehealth: Payer: Self-pay

## 2020-01-31 NOTE — Telephone Encounter (Signed)
lmom to confirm and screen for 02-02-20 ov.

## 2020-02-02 ENCOUNTER — Ambulatory Visit: Payer: BC Managed Care – PPO | Admitting: Nurse Practitioner

## 2020-02-04 LAB — RHEUMATOID FACTOR: Rheumatoid fact SerPl-aCnc: <10 IU/mL (ref 0.0–13.9)

## 2020-02-04 LAB — CBC WITH DIFFERENTIAL/PLATELET
Basophils Absolute: 0.1 10*3/uL (ref 0.0–0.2)
Basos: 1 %
EOS (ABSOLUTE): 0.4 10*3/uL (ref 0.0–0.4)
Eos: 6 %
Hematocrit: 39 % (ref 34.0–46.6)
Hemoglobin: 13.2 g/dL (ref 11.1–15.9)
Immature Grans (Abs): 0 10*3/uL (ref 0.0–0.1)
Immature Granulocytes: 0 %
Lymphocytes Absolute: 2.3 10*3/uL (ref 0.7–3.1)
Lymphs: 40 %
MCH: 27.9 pg (ref 26.6–33.0)
MCHC: 33.8 g/dL (ref 31.5–35.7)
MCV: 83 fL (ref 79–97)
Monocytes Absolute: 0.6 10*3/uL (ref 0.1–0.9)
Monocytes: 10 %
Neutrophils Absolute: 2.5 10*3/uL (ref 1.4–7.0)
Neutrophils: 43 %
Platelets: 252 10*3/uL (ref 150–450)
RBC: 4.73 x10E6/uL (ref 3.77–5.28)
RDW: 11.9 % (ref 11.7–15.4)
WBC: 5.8 10*3/uL (ref 3.4–10.8)

## 2020-02-04 LAB — ANA W/REFLEX IF POSITIVE
Anti JO-1: <0.2 AI (ref 0.0–0.9)
Anti Nuclear Antibody (ANA): POSITIVE — AB
Centromere Ab Screen: <0.2 AI (ref 0.0–0.9)
Chromatin Ab SerPl-aCnc: <0.2 AI (ref 0.0–0.9)
ENA RNP Ab: 1.5 AI — ABNORMAL HIGH (ref 0.0–0.9)
ENA SM Ab Ser-aCnc: <0.2 AI (ref 0.0–0.9)
ENA SSA (RO) Ab: <0.2 AI (ref 0.0–0.9)
ENA SSB (LA) Ab: <0.2 AI (ref 0.0–0.9)
Scleroderma (Scl-70) (ENA) Antibody, IgG: <0.2 AI (ref 0.0–0.9)
dsDNA Ab: <1 IU/mL (ref 0–9)

## 2020-02-04 LAB — SEDIMENTATION RATE: Sed Rate: 2 mm/hr (ref 0–40)

## 2020-02-04 LAB — URIC ACID: Uric Acid: 3.9 mg/dL (ref 3.0–7.2)

## 2020-02-05 ENCOUNTER — Other Ambulatory Visit: Payer: Self-pay | Admitting: Adult Health

## 2020-02-05 DIAGNOSIS — J988 Other specified respiratory disorders: Secondary | ICD-10-CM

## 2020-02-05 DIAGNOSIS — B373 Candidiasis of vulva and vagina: Secondary | ICD-10-CM

## 2020-02-05 DIAGNOSIS — B3731 Acute candidiasis of vulva and vagina: Secondary | ICD-10-CM

## 2020-02-06 ENCOUNTER — Other Ambulatory Visit: Payer: Self-pay

## 2020-02-06 ENCOUNTER — Ambulatory Visit (INDEPENDENT_AMBULATORY_CARE_PROVIDER_SITE_OTHER): Payer: BC Managed Care – PPO | Admitting: Nurse Practitioner

## 2020-02-06 ENCOUNTER — Encounter: Payer: Self-pay | Admitting: Nurse Practitioner

## 2020-02-06 VITALS — BP 104/61 | HR 64 | Temp 97.8°F | Resp 16 | Ht 64.0 in | Wt 161.0 lb

## 2020-02-06 DIAGNOSIS — K219 Gastro-esophageal reflux disease without esophagitis: Secondary | ICD-10-CM

## 2020-02-06 DIAGNOSIS — R635 Abnormal weight gain: Secondary | ICD-10-CM | POA: Diagnosis not present

## 2020-02-06 DIAGNOSIS — M064 Inflammatory polyarthropathy: Secondary | ICD-10-CM | POA: Diagnosis not present

## 2020-02-06 DIAGNOSIS — R768 Other specified abnormal immunological findings in serum: Secondary | ICD-10-CM

## 2020-02-06 MED ORDER — PHENTERMINE HCL 37.5 MG PO TABS: 37.5000 mg | ORAL_TABLET | Freq: Every day | ORAL | 1 refills | Status: DC

## 2020-02-06 MED ORDER — PANTOPRAZOLE SODIUM 40 MG PO TBEC: 40.0000 mg | DELAYED_RELEASE_TABLET | Freq: Two times a day (BID) | ORAL | 5 refills | Status: DC

## 2020-02-06 NOTE — Progress Notes (Signed)
[facility] [place], [place] [address]  Internal MEDICINE  Office Visit Note  Patient Name: Andrea Bernard  [id number]  [id number]  Date of Service: 02/06/2020  Chief Complaint  Patient presents with  . Gastroesophageal Reflux  . Arthritis  . Anemia  . Follow-up    lab work     The patient is here for follow up of lab work. She had been having -generalized joint pain - wrists, knees, feet, elbows, low back. Does have neurofibromatosis, however, this pain is different than usual pain she deals with from neurofibromatosis. She had labs done . She has positive ANA with RNP antibodies positive. Will refer to rheumatology for further evaluation.  She is also concerned about weight gain. She has ained approximately 9 pounds over past few months. She has taken phentermine in the past to help jump start weight loss going into the summer. This has worked well for her without negative side effects.       Current Medication: Outpatient Encounter Medications as of 02/06/2020  Medication Sig  . acetaminophen (TYLENOL) 325 MG tablet Take 650 mg by mouth.  . cyclobenzaprine (FLEXERIL) 10 MG tablet Take 10 mg by mouth daily.  . famotidine (PEPCID) 20 MG tablet Take 1 tablet (20 mg total) by mouth daily.  . fluconazole (DIFLUCAN) 150 MG tablet Take 1 tablet po once. May repeat dose in 3 days as needed for persistent symptoms.  . fluconazole (DIFLUCAN) 150 MG tablet Take 1 tablet (150 mg total) by mouth every three (3) days as needed.  . gabapentin (NEURONTIN) 300 MG capsule Take 300 mg by mouth 3 (three) times daily.  . OXYCONTIN 80 MG 12 hr tablet Take 80 mg by mouth every 12 (twelve) hours.  . pantoprazole (PROTONIX) 40 MG tablet Take 1 tablet (40 mg total) by mouth 2 (two) times daily.  . phentermine (ADIPEX-P) 37.5 MG tablet Take 1 tablet (37.5 mg total) by mouth daily before breakfast.  . terbinafine (LAMISIL) 250 MG tablet Take 1 tablet (250 mg total) by mouth  daily.  . [DISCONTINUED] pantoprazole (PROTONIX) 40 MG tablet Take 1 tablet (40 mg total) by mouth 2 (two) times daily.  . [DISCONTINUED] phentermine (ADIPEX-P) 37.5 MG tablet Take 1 tablet (37.5 mg total) by mouth daily before breakfast.  . amitriptyline (ELAVIL) 25 MG tablet Take by mouth.  . SUMAtriptan (IMITREX) 100 MG tablet Take 1 tablet (100 mg total) by mouth daily as needed for migraine. (Patient not taking: Reported on 02/06/2020)  . [DISCONTINUED] azithromycin (ZITHROMAX) 250 MG tablet Take two tablets PO on day one, and then take one tablet on days 2-10. (Patient not taking: Reported on 02/06/2020)  . [DISCONTINUED] estradiol-norethindrone (ACTIVELLA) 1-0.5 MG tablet Take 1 tablet by mouth daily.   Facility-Administered Encounter Medications as of 02/06/2020  Medication  . iron sucrose (VENOFER) 200 mg IVPB  . sodium chloride flush (NS) 0.9 % injection 10 mL    Surgical History: Past Surgical History:  Procedure Laterality Date  . carpel tunnel release    . CESAREAN SECTION     x 4  . COLONOSCOPY WITH PROPOFOL N/A 09/04/2017   Procedure: COLONOSCOPY WITH PROPOFOL;  Surgeon: [name], MD;  Location: [facility] ENDOSCOPY;  Service: Gastroenterology;  Laterality: N/A;  . COLONOSCOPY WITH PROPOFOL N/A 12/21/2017   Procedure: COLONOSCOPY WITH PROPOFOL;  Surgeon: [name], MD;  Location: [facility] ENDOSCOPY;  Service: Gastroenterology;  Laterality: N/A;  . ESOPHAGOGASTRODUODENOSCOPY (EGD) WITH PROPOFOL N/A 09/04/2017   Procedure: ESOPHAGOGASTRODUODENOSCOPY (EGD)  WITH PROPOFOL;  Surgeon: [name], MD;  Location: [facility] ENDOSCOPY;  Service: Gastroenterology;  Laterality: N/A;  . HERNIA REPAIR    . TUBAL LIGATION    . TUMOR REMOVAL      Medical History: Past Medical History:  Diagnosis Date  . Arthritis   . GERD (gastroesophageal reflux disease)   . Headache   . Iron deficiency anemia 06/15/2017  . Neurofibromatosis ([place])     Family History: Family History  Problem Relation Age of  Onset  . Anemia Mother   . Bladder Cancer Neg Hx   . Kidney cancer Neg Hx   . Breast cancer Neg Hx   . Ovarian cancer Neg Hx   . Colon cancer Neg Hx     Social History   Socioeconomic History  . Marital status: Married    Spouse name: Not on file  . Number of children: Not on file  . Years of education: Not on file  . Highest education level: Not on file  Occupational History  . Not on file  Tobacco Use  . Smoking status: Never Smoker  . Smokeless tobacco: Never Used  Substance and Sexual Activity  . Alcohol use: No  . Drug use: Not Currently  . Sexual activity: Yes    Birth control/protection: Surgical  Other Topics Concern  . Not on file  Social History Narrative  . Not on file   Social Determinants of Health   Financial Resource Strain:   . Difficulty of Paying Living Expenses:   Food Insecurity:   . Worried About Charity fundraiser in the Last Year:   . Arboriculturist in the Last Year:   Transportation Needs:   . Film/video editor (Medical):   [name] Lack of Transportation (Non-Medical):   Physical Activity:   . Days of Exercise per Week:   . Minutes of Exercise per Session:   Stress:   . Feeling of Stress :   Social Connections:   . Frequency of Communication with Friends and Family:   . Frequency of Social Gatherings with Friends and Family:   . Attends Religious Services:   . Active Member of Clubs or Organizations:   . Attends Archivist Meetings:   [name] Marital Status:   Intimate Partner Violence:   . Fear of Current or Ex-Partner:   . Emotionally Abused:   [name] Physically Abused:   . Sexually Abused:       Review of Systems  Constitutional: Positive for unexpected weight change. Negative for chills and fatigue.       Nine pound weight gain since her last visit.   HENT: Negative for congestion, postnasal drip, rhinorrhea, sneezing and sore throat.   Respiratory: Negative for cough, chest tightness, shortness of breath and wheezing.    Cardiovascular: Negative for chest pain and palpitations.  Gastrointestinal: Negative for abdominal pain, constipation, diarrhea, nausea and vomiting.  Endocrine: Negative for cold intolerance, heat intolerance, polydipsia and polyuria.  Musculoskeletal: Positive for arthralgias, back pain, joint swelling and myalgias. Negative for neck pain.  Skin: Negative for rash.  Allergic/Immunologic: Negative for environmental allergies.  Neurological: Negative for dizziness, tremors, numbness and headaches.  Hematological: Negative for adenopathy. Does not bruise/bleed easily.  Psychiatric/Behavioral: Negative for behavioral problems (Depression), sleep disturbance and suicidal ideas. The patient is nervous/anxious.     Today's Vitals   02/06/20 1159  BP: 104/61  Pulse: 64  Resp: 16  Temp: 97.8 F (36.6 C)  SpO2: 95%  Weight: 161  lb (73 kg)  Height: 5\' 4"  (1.626 m)   Body mass index is 27.64 kg/m.  Physical Exam Vitals and nursing note reviewed.  Constitutional:      General: She is not in acute distress.    Appearance: Normal appearance. She is well-developed. She is not diaphoretic.  HENT:     Head: Normocephalic and atraumatic.     Mouth/Throat:     Pharynx: No oropharyngeal exudate.  Eyes:     Pupils: Pupils are equal, round, and reactive to light.  Neck:     Thyroid: No thyromegaly.     Vascular: No JVD.     Trachea: No tracheal deviation.  Cardiovascular:     Rate and Rhythm: Normal rate and regular rhythm.     Heart sounds: Normal heart sounds. No murmur. No friction rub. No gallop.   Pulmonary:     Effort: Pulmonary effort is normal. No respiratory distress.     Breath sounds: Normal breath sounds. No wheezing or rales.  Chest:     Chest wall: No tenderness.  Abdominal:     Palpations: Abdomen is soft.  Musculoskeletal:        General: Normal range of motion.     Cervical back: Normal range of motion and neck supple.     Comments: There is generalized joint pain  with point tenderness present. multiple neurofibromas   Lymphadenopathy:     Cervical: No cervical adenopathy.  Skin:    General: Skin is warm and dry.  Neurological:     Mental Status: She is alert and oriented to person, place, and time.     Cranial Nerves: No cranial nerve deficit.  Psychiatric:        Behavior: Behavior normal.        Thought Content: Thought content normal.        Judgment: Judgment normal.   Assessment/Plan: 1. Inflammatory polyarthropathy ([place]) Review labs. ANA positive with positive RNP antibodies. Refer to rheumatology for further evaluation.  - Ambulatory referral to Rheumatology  2. ANA positive Review labs. ANA positive with positive RNP antibodies. Refer to rheumatology for further evaluation.  - Ambulatory referral to Rheumatology  3. Gastroesophageal reflux disease without esophagitis May conitnue pantoprazole every day. Take pepcid as needed for breakthrough GERD symptoms. - pantoprazole (PROTONIX) 40 MG tablet; Take 1 tablet (40 mg total) by mouth 2 (two) times daily.  Dispense: 60 tablet; Refill: 5  4. Abnormal weight gain Weight gain 9 pounds since her last visit. Restart phentermine daily. Limit calorie intake to 1200 calories per day. Incorporate exercise into daily routine.  - phentermine (ADIPEX-P) 37.5 MG tablet; Take 1 tablet (37.5 mg total) by mouth daily before breakfast.  Dispense: 30 tablet; Refill: 1  General Counseling: Bryson verbalizes understanding of the findings of todays visit and agrees with plan of treatment. I have discussed any further diagnostic evaluation that may be needed or ordered today. We also reviewed her medications today. she has been encouraged to call the office with any questions or concerns that should arise related to todays visit.   There is a liability release in patients' chart. There has been a 10 minute discussion about the side effects including but not limited to elevated blood pressure, anxiety, lack  of sleep and dry mouth. Pt understands and will like to start/continue on appetite suppressant at this time. There will be one month RX given at the time of visit with proper follow up. Nova diet plan with restricted calories is given  to the pt. Pt understands and agrees with  plan of treatment  This patient was seen by Leretha Pol FNP Collaboration with Dr [name] as a part of collaborative care agreement  Orders Placed This Encounter  Procedures  . Ambulatory referral to Rheumatology    Meds ordered this encounter  Medications  . pantoprazole (PROTONIX) 40 MG tablet    Sig: Take 1 tablet (40 mg total) by mouth 2 (two) times daily.    Dispense:  60 tablet    Refill:  5    Order Specific Question:   Supervising Provider    Answer:   [name] [id number]  . phentermine (ADIPEX-P) 37.5 MG tablet    Sig: Take 1 tablet (37.5 mg total) by mouth daily before breakfast.    Dispense:  30 tablet    Refill:  1    Order Specific Question:   Supervising Provider    Answer:   [name] [id number]    Total time spent: 30 Minutes  Time spent includes review of chart, medications, test results, and follow up plan with the patient.      Dr [name] Internal medicine

## 2020-02-06 NOTE — Progress Notes (Signed)
Yes. Saw her today and made referral.

## 2020-02-23 ENCOUNTER — Ambulatory Visit: Payer: Self-pay | Admitting: Nurse Practitioner

## 2020-02-29 ENCOUNTER — Ambulatory Visit: Payer: Self-pay | Admitting: Dermatology

## 2020-03-06 ENCOUNTER — Other Ambulatory Visit: Payer: Self-pay

## 2020-03-06 ENCOUNTER — Other Ambulatory Visit: Payer: Self-pay | Admitting: Podiatry

## 2020-03-06 MED ORDER — FAMOTIDINE 20 MG PO TABS: 20.0000 mg | ORAL_TABLET | Freq: Every day | ORAL | 3 refills | Status: DC

## 2020-03-08 ENCOUNTER — Other Ambulatory Visit: Payer: Self-pay

## 2020-03-09 ENCOUNTER — Other Ambulatory Visit: Payer: Self-pay

## 2020-03-09 DIAGNOSIS — G43909 Migraine, unspecified, not intractable, without status migrainosus: Secondary | ICD-10-CM

## 2020-03-09 MED ORDER — SUMATRIPTAN SUCCINATE 100 MG PO TABS: 100.0000 mg | ORAL_TABLET | Freq: Every day | ORAL | 1 refills | Status: DC | PRN

## 2020-04-18 ENCOUNTER — Other Ambulatory Visit: Payer: Self-pay | Admitting: Podiatry

## 2020-04-24 ENCOUNTER — Encounter: Payer: Self-pay | Admitting: Adult Health

## 2020-04-24 ENCOUNTER — Ambulatory Visit: Payer: BC Managed Care – PPO | Admitting: Adult Health

## 2020-04-24 VITALS — Resp 16 | Ht 64.0 in | Wt 156.0 lb

## 2020-04-24 DIAGNOSIS — J0100 Acute maxillary sinusitis, unspecified: Secondary | ICD-10-CM

## 2020-04-24 MED ORDER — AZITHROMYCIN 250 MG PO TABS: ORAL_TABLET | ORAL | 0 refills | Status: DC

## 2020-04-24 MED ORDER — FLUCONAZOLE 150 MG PO TABS: 150.0000 mg | ORAL_TABLET | ORAL | 0 refills | Status: DC | PRN

## 2020-04-24 NOTE — Progress Notes (Signed)
[facility] [place], [place] [address]  Internal MEDICINE  Telephone Visit  Patient Name: Andrea Bernard  [id number]  [id number]  Date of Service: 04/24/2020  I connected with the patient at  1043  by telephone and verified the patients identity using two identifiers.   I discussed the limitations, risks, security and privacy concerns of performing an evaluation and management service by telephone and the availability of in person appointments. I also discussed with the patient that there may be a patient responsible charge related to the service.  The patient expressed understanding and agrees to proceed.    Chief Complaint  Patient presents with   Telephone Screen    runny nose last week,chest congestion    Telephone Assessment   Fever   Cough   Nasal Congestion    HPI  Patient seen via telephone.  She reports she has been having runny nose, and chest congestion that has been going on for 10 days. She report some mild fever and cough.  She has been taking vitamin C and mucinex. She reports this is typical for her, and it usually takes 10 days of z pack to get over it.     Current Medication: Outpatient Encounter Medications as of 04/24/2020  Medication Sig   acetaminophen (TYLENOL) 325 MG tablet Take 650 mg by mouth.   cyclobenzaprine (FLEXERIL) 10 MG tablet Take 10 mg by mouth daily.   famotidine (PEPCID) 20 MG tablet Take 1 tablet (20 mg total) by mouth daily.   fluconazole (DIFLUCAN) 150 MG tablet Take 1 tablet po once. May repeat dose in 3 days as needed for persistent symptoms.   fluconazole (DIFLUCAN) 150 MG tablet Take 1 tablet (150 mg total) by mouth every three (3) days as needed.   gabapentin (NEURONTIN) 300 MG capsule Take 300 mg by mouth 3 (three) times daily.   OXYCONTIN 80 MG 12 hr tablet Take 80 mg by mouth every 12 (twelve) hours.   pantoprazole (PROTONIX) 40 MG tablet Take 1 tablet (40 mg total) by mouth 2 (two) times  daily.   phentermine (ADIPEX-P) 37.5 MG tablet Take 1 tablet (37.5 mg total) by mouth daily before breakfast.   SUMAtriptan (IMITREX) 100 MG tablet Take 1 tablet (100 mg total) by mouth daily as needed for migraine.   amitriptyline (ELAVIL) 25 MG tablet Take by mouth.   azithromycin (ZITHROMAX) 250 MG tablet Take 500mg  on day one, and take 250mg  on days 2-10   fluconazole (DIFLUCAN) 150 MG tablet Take 1 tablet (150 mg total) by mouth every three (3) days as needed.   Facility-Administered Encounter Medications as of 04/24/2020  Medication   iron sucrose (VENOFER) 200 mg IVPB   sodium chloride flush (NS) 0.9 % injection 10 mL    Surgical History: Past Surgical History:  Procedure Laterality Date   carpel tunnel release     CESAREAN SECTION     x 4   COLONOSCOPY WITH PROPOFOL N/A 09/04/2017   Procedure: COLONOSCOPY WITH PROPOFOL;  Surgeon: [name], MD;  Location: [facility] ENDOSCOPY;  Service: Gastroenterology;  Laterality: N/A;   COLONOSCOPY WITH PROPOFOL N/A 12/21/2017   Procedure: COLONOSCOPY WITH PROPOFOL;  Surgeon: [name], MD;  Location: [facility] ENDOSCOPY;  Service: Gastroenterology;  Laterality: N/A;   ESOPHAGOGASTRODUODENOSCOPY (EGD) WITH PROPOFOL N/A 09/04/2017   Procedure: ESOPHAGOGASTRODUODENOSCOPY (EGD) WITH PROPOFOL;  Surgeon: [name], MD;  Location: [facility] ENDOSCOPY;  Service: Gastroenterology;  Laterality: N/A;   HERNIA REPAIR     TUBAL LIGATION  TUMOR REMOVAL      Medical History: Past Medical History:  Diagnosis Date   Arthritis    GERD (gastroesophageal reflux disease)    Headache    Iron deficiency anemia 06/15/2017   Neurofibromatosis ([place])     Family History: Family History  Problem Relation Age of Onset   Anemia Mother    Bladder Cancer Neg Hx    Kidney cancer Neg Hx    Breast cancer Neg Hx    Ovarian cancer Neg Hx    Colon cancer Neg Hx     Social History   Socioeconomic History   Marital status: Married     Spouse name: Not on file   Number of children: Not on file   Years of education: Not on file   Highest education level: Not on file  Occupational History   Not on file  Tobacco Use   Smoking status: Never Smoker   Smokeless tobacco: Never Used  Vaping Use   Vaping Use: Never used  Substance and Sexual Activity   Alcohol use: No   Drug use: Not Currently   Sexual activity: Yes    Birth control/protection: Surgical  Other Topics Concern   Not on file  Social History Narrative   Not on file   Social Determinants of Health   Financial Resource Strain:    Difficulty of Paying Living Expenses:   Food Insecurity:    Worried About Charity fundraiser in the Last Year:    Arboriculturist in the Last Year:   Transportation Needs:    Film/video editor (Medical):    Lack of Transportation (Non-Medical):   Physical Activity:    Days of Exercise per Week:    Minutes of Exercise per Session:   Stress:    Feeling of Stress :   Social Connections:    Frequency of Communication with Friends and Family:    Frequency of Social Gatherings with Friends and Family:    Attends Religious Services:    Active Member of Clubs or Organizations:    Attends Music therapist:    Marital Status:   Intimate Partner Violence:    Fear of Current or Ex-Partner:    Emotionally Abused:    Physically Abused:    Sexually Abused:       Review of Systems  Constitutional: Negative for chills, fatigue and unexpected weight change.  HENT: Positive for postnasal drip, sinus pressure and sore throat. Negative for congestion, rhinorrhea and sneezing.   Eyes: Negative for photophobia, pain and redness.  Respiratory: Positive for cough. Negative for chest tightness and shortness of breath.   Cardiovascular: Negative for chest pain and palpitations.  Gastrointestinal: Negative for abdominal pain, constipation, diarrhea, nausea and vomiting.  Endocrine: Negative.    Genitourinary: Negative for dysuria and frequency.  Musculoskeletal: Negative for arthralgias, back pain, joint swelling and neck pain.  Skin: Negative for rash.  Allergic/Immunologic: Negative.   Neurological: Negative for tremors and numbness.  Hematological: Negative for adenopathy. Does not bruise/bleed easily.  Psychiatric/Behavioral: Negative for behavioral problems and sleep disturbance. The patient is not nervous/anxious.     Vital Signs: Resp 16    Ht 5\' 4"  (1.626 m)    Wt 156 lb (70.8 kg)    BMI 26.78 kg/m    Observation/Objective:  Ill sounding, NAD noted   Assessment/Plan: 1. Subacute maxillary sinusitis Advised patient to take entire course of antibiotics as prescribed with food. Pt should return to clinic in 7-10  days if symptoms fail to improve or new symptoms develop.  - azithromycin (ZITHROMAX) 250 MG tablet; Take 500mg  on day one, and take 250mg  on days 2-10  Dispense: 11 tablet; Refill: 0  General Counseling: Andrea Bernard verbalizes understanding of the findings of today's phone visit and agrees with plan of treatment. I have discussed any further diagnostic evaluation that may be needed or ordered today. We also reviewed her medications today. she has been encouraged to call the office with any questions or concerns that should arise related to todays visit.    No orders of the defined types were placed in this encounter.   Meds ordered this encounter  Medications   azithromycin (ZITHROMAX) 250 MG tablet    Sig: Take 500mg  on day one, and take 250mg  on days 2-10    Dispense:  11 tablet    Refill:  0   fluconazole (DIFLUCAN) 150 MG tablet    Sig: Take 1 tablet (150 mg total) by mouth every three (3) days as needed.    Dispense:  3 tablet    Refill:  0    Time spent: [place] AGNP-C Internal medicine

## 2020-05-07 ENCOUNTER — Ambulatory Visit (INDEPENDENT_AMBULATORY_CARE_PROVIDER_SITE_OTHER): Payer: BC Managed Care – PPO | Admitting: Adult Health

## 2020-05-07 ENCOUNTER — Encounter: Payer: Self-pay | Admitting: Adult Health

## 2020-05-07 VITALS — Ht 64.0 in | Wt 156.0 lb

## 2020-05-07 DIAGNOSIS — B3731 Acute candidiasis of vulva and vagina: Secondary | ICD-10-CM

## 2020-05-07 DIAGNOSIS — B373 Candidiasis of vulva and vagina: Secondary | ICD-10-CM | POA: Diagnosis not present

## 2020-05-07 DIAGNOSIS — J0100 Acute maxillary sinusitis, unspecified: Secondary | ICD-10-CM

## 2020-05-07 MED ORDER — FLUCONAZOLE 150 MG PO TABS: ORAL_TABLET | ORAL | 1 refills | Status: DC

## 2020-05-07 MED ORDER — AMOXICILLIN 500 MG PO CAPS: 500.0000 mg | ORAL_CAPSULE | Freq: Two times a day (BID) | ORAL | 0 refills | Status: DC

## 2020-05-07 NOTE — Progress Notes (Signed)
[facility] [place], [place] [address]  Internal MEDICINE  Telephone Visit  Patient Name: Andrea Bernard  [id number]  [id number]  Date of Service: 05/07/2020  I connected with the patient at 156 by telephone and verified the patients identity using two identifiers.   I discussed the limitations, risks, security and privacy concerns of performing an evaluation and management service by telephone and the availability of in person appointments. I also discussed with the patient that there may be a patient responsible charge related to the service.  The patient expressed understanding and agrees to proceed.    Chief Complaint  Patient presents with  . Telephone Screen    phone [phone]  . Telephone Assessment  . Sinusitis    few days     HPI  Pt is seen via telephone.  She has recently finished a 10 day course of z pak for sinus infection.  She continues to have sinus pain and pressure along with copious amounts of drainage.  She denies fever, fatigue, or sob.    Current Medication: Outpatient Encounter Medications as of 05/07/2020  Medication Sig  . acetaminophen (TYLENOL) 325 MG tablet Take 650 mg by mouth.  . cyclobenzaprine (FLEXERIL) 10 MG tablet Take 10 mg by mouth daily.  . famotidine (PEPCID) 20 MG tablet Take 1 tablet (20 mg total) by mouth daily.  . fluconazole (DIFLUCAN) 150 MG tablet Take 1 tablet po every 3 days while on antibiotics.  [name] gabapentin (NEURONTIN) 300 MG capsule Take 300 mg by mouth 3 (three) times daily.  . OXYCONTIN 80 MG 12 hr tablet Take 80 mg by mouth every 12 (twelve) hours.  . pantoprazole (PROTONIX) 40 MG tablet Take 1 tablet (40 mg total) by mouth 2 (two) times daily.  . phentermine (ADIPEX-P) 37.5 MG tablet Take 1 tablet (37.5 mg total) by mouth daily before breakfast.  . SUMAtriptan (IMITREX) 100 MG tablet Take 1 tablet (100 mg total) by mouth daily as needed for migraine.  . [DISCONTINUED] fluconazole (DIFLUCAN)  150 MG tablet Take 1 tablet po once. May repeat dose in 3 days as needed for persistent symptoms.  [name] amitriptyline (ELAVIL) 25 MG tablet Take by mouth.  [name] amoxicillin (AMOXIL) 500 MG capsule Take 1 capsule (500 mg total) by mouth 2 (two) times daily.  . [DISCONTINUED] azithromycin (ZITHROMAX) 250 MG tablet Take 500mg  on day one, and take 250mg  on days 2-10  . [DISCONTINUED] fluconazole (DIFLUCAN) 150 MG tablet Take 1 tablet (150 mg total) by mouth every three (3) days as needed.  . [DISCONTINUED] fluconazole (DIFLUCAN) 150 MG tablet Take 1 tablet (150 mg total) by mouth every three (3) days as needed.   Facility-Administered Encounter Medications as of 05/07/2020  Medication  . iron sucrose (VENOFER) 200 mg IVPB  . sodium chloride flush (NS) 0.9 % injection 10 mL    Surgical History: Past Surgical History:  Procedure Laterality Date  . carpel tunnel release    . CESAREAN SECTION     x 4  . COLONOSCOPY WITH PROPOFOL N/A 09/04/2017   Procedure: COLONOSCOPY WITH PROPOFOL;  Surgeon: [name], MD;  Location: [facility] ENDOSCOPY;  Service: Gastroenterology;  Laterality: N/A;  . COLONOSCOPY WITH PROPOFOL N/A 12/21/2017   Procedure: COLONOSCOPY WITH PROPOFOL;  Surgeon: [name], MD;  Location: [facility] ENDOSCOPY;  Service: Gastroenterology;  Laterality: N/A;  . ESOPHAGOGASTRODUODENOSCOPY (EGD) WITH PROPOFOL N/A 09/04/2017   Procedure: ESOPHAGOGASTRODUODENOSCOPY (EGD) WITH PROPOFOL;  Surgeon: [name], MD;  Location: [facility] ENDOSCOPY;  Service:  Gastroenterology;  Laterality: N/A;  . HERNIA REPAIR    . TUBAL LIGATION    . TUMOR REMOVAL      Medical History: Past Medical History:  Diagnosis Date  . Arthritis   . GERD (gastroesophageal reflux disease)   . Headache   . Iron deficiency anemia 06/15/2017  . Neurofibromatosis ([place])     Family History: Family History  Problem Relation Age of Onset  . Anemia Mother   . Bladder Cancer Neg Hx   . Kidney cancer Neg Hx   . Breast cancer Neg Hx   .  Ovarian cancer Neg Hx   . Colon cancer Neg Hx     Social History   Socioeconomic History  . Marital status: Married    Spouse name: Not on file  . Number of children: Not on file  . Years of education: Not on file  . Highest education level: Not on file  Occupational History  . Not on file  Tobacco Use  . Smoking status: Never Smoker  . Smokeless tobacco: Never Used  Vaping Use  . Vaping Use: Never used  Substance and Sexual Activity  . Alcohol use: No  . Drug use: Not Currently  . Sexual activity: Yes    Birth control/protection: Surgical  Other Topics Concern  . Not on file  Social History Narrative  . Not on file   Social Determinants of Health   Financial Resource Strain:   . Difficulty of Paying Living Expenses:   Food Insecurity:   . Worried About Charity fundraiser in the Last Year:   . Arboriculturist in the Last Year:   Transportation Needs:   . Film/video editor (Medical):   [name] Lack of Transportation (Non-Medical):   Physical Activity:   . Days of Exercise per Week:   . Minutes of Exercise per Session:   Stress:   . Feeling of Stress :   Social Connections:   . Frequency of Communication with Friends and Family:   . Frequency of Social Gatherings with Friends and Family:   . Attends Religious Services:   . Active Member of Clubs or Organizations:   . Attends Archivist Meetings:   [name] Marital Status:   Intimate Partner Violence:   . Fear of Current or Ex-Partner:   . Emotionally Abused:   [name] Physically Abused:   . Sexually Abused:       Review of Systems  Constitutional: Negative for chills, fatigue and unexpected weight change.  HENT: Positive for postnasal drip, rhinorrhea, sinus pressure and sinus pain. Negative for congestion, sneezing and sore throat.   Eyes: Negative for photophobia, pain and redness.  Respiratory: Negative for cough, chest tightness and shortness of breath.   Cardiovascular: Negative for chest pain and  palpitations.  Gastrointestinal: Negative for abdominal pain, constipation, diarrhea, nausea and vomiting.  Endocrine: Negative.   Genitourinary: Negative for dysuria and frequency.  Musculoskeletal: Negative for arthralgias, back pain, joint swelling and neck pain.  Skin: Negative for rash.  Allergic/Immunologic: Negative.   Neurological: Negative for tremors and numbness.  Hematological: Negative for adenopathy. Does not bruise/bleed easily.  Psychiatric/Behavioral: Negative for behavioral problems and sleep disturbance. The patient is not nervous/anxious.     Vital Signs: Ht 5\' 4"  (1.626 m)   Wt 156 lb (70.8 kg)   BMI 26.78 kg/m    Observation/Objective: Ill sounding, NAD noted    Assessment/Plan: 1. Acute maxillary sinusitis, recurrence not specified Advised patient to take entire  course of antibiotics as prescribed with food. Pt should return to clinic in 7-10 days if symptoms fail to improve or new symptoms develop.  - amoxicillin (AMOXIL) 500 MG capsule; Take 1 capsule (500 mg total) by mouth 2 (two) times daily.  Dispense: 20 capsule; Refill: 0  2. Vaginal yeast infection Use diflucan as discussed.  - fluconazole (DIFLUCAN) 150 MG tablet; Take 1 tablet po every 3 days while on antibiotics.  Dispense: 4 tablet; Refill: 1  General Counseling: Milianna verbalizes understanding of the findings of today's phone visit and agrees with plan of treatment. I have discussed any further diagnostic evaluation that may be needed or ordered today. We also reviewed her medications today. she has been encouraged to call the office with any questions or concerns that should arise related to todays visit.    No orders of the defined types were placed in this encounter.   Meds ordered this encounter  Medications  . amoxicillin (AMOXIL) 500 MG capsule    Sig: Take 1 capsule (500 mg total) by mouth 2 (two) times daily.    Dispense:  20 capsule    Refill:  0  . fluconazole (DIFLUCAN)  150 MG tablet    Sig: Take 1 tablet po every 3 days while on antibiotics.    Dispense:  4 tablet    Refill:  1    Time spent: [place] AGNP-C Internal medicine

## 2020-05-22 ENCOUNTER — Other Ambulatory Visit: Payer: Self-pay

## 2020-05-22 ENCOUNTER — Ambulatory Visit: Payer: BC Managed Care – PPO | Admitting: Dermatology

## 2020-05-22 DIAGNOSIS — G43909 Migraine, unspecified, not intractable, without status migrainosus: Secondary | ICD-10-CM

## 2020-05-22 MED ORDER — SUMATRIPTAN SUCCINATE 100 MG PO TABS: 100.0000 mg | ORAL_TABLET | Freq: Every day | ORAL | 1 refills | Status: DC | PRN

## 2020-05-23 ENCOUNTER — Other Ambulatory Visit: Payer: Self-pay

## 2020-05-23 ENCOUNTER — Ambulatory Visit: Payer: BC Managed Care – PPO | Admitting: Dermatology

## 2020-05-23 ENCOUNTER — Other Ambulatory Visit: Payer: Self-pay | Admitting: Nurse Practitioner

## 2020-05-23 DIAGNOSIS — L821 Other seborrheic keratosis: Secondary | ICD-10-CM

## 2020-05-23 DIAGNOSIS — D1724 Benign lipomatous neoplasm of skin and subcutaneous tissue of left leg: Secondary | ICD-10-CM | POA: Diagnosis not present

## 2020-05-23 DIAGNOSIS — Z1231 Encounter for screening mammogram for malignant neoplasm of breast: Secondary | ICD-10-CM

## 2020-05-23 DIAGNOSIS — D2361 Other benign neoplasm of skin of right upper limb, including shoulder: Secondary | ICD-10-CM

## 2020-05-23 DIAGNOSIS — D239 Other benign neoplasm of skin, unspecified: Secondary | ICD-10-CM

## 2020-05-23 MED ORDER — FAMOTIDINE 20 MG PO TABS: 20.0000 mg | ORAL_TABLET | Freq: Every day | ORAL | 3 refills | Status: DC

## 2020-05-23 NOTE — Progress Notes (Signed)
   Follow-Up Visit   Subjective  Andrea Bernard is a 54 y.o. female who presents for the following: painful area (R thumb, hx of bx proven dermatofibroma 11/2019, area had a blister form ~01/2020 and in August she hit it and it went down) and check spots (R leg, L arm ~ 30yr).  The following portions of the chart were reviewed this encounter and updated as appropriate:  Tobacco  Allergies  Meds  Problems  Med Hx  Surg Hx  Fam Hx     Review of Systems:  No other skin or systemic complaints except as noted in HPI or Assessment and Plan.  Objective  Well appearing patient in no apparent distress; mood and affect are within normal limits.  A focused examination was performed including Right thumb, L arm, bil legs. Relevant physical exam findings are noted in the Assessment and Plan.  Objective  Right proximal dorsum thumb: 0.8 x 0.5cm firm pink plaque  Images    Objective  Left distal anterior thigh: 5.0cm rubbery nodule Rubbery nodules arms, legs   Assessment & Plan    Seborrheic Keratoses - Stuck-on, waxy, tan-brown papules and plaques  - Discussed benign etiology and prognosis. - Observe - Call for any changes  Dermatofibroma Right proximal dorsum thumb Bx proven 10/08/2019 With recurrence, benign  Discussed observation vs shave removal vs excision  Lipoma of left lower extremity - benign appearing but symptomatic Left distal anterior thigh Benign, discussed excising L distal anterior thigh, observe others  Return for to be scheduled for Lipoma surgery, L distal anterior thigh.   I, Othelia Pulling, RMA, am acting as scribe for Sarina Ser, MD .  Documentation: I have reviewed the above documentation for accuracy and completeness, and I agree with the above.  Sarina Ser, MD

## 2020-05-23 NOTE — Patient Instructions (Signed)
Pre-Operative Instructions  You are scheduled for a surgical procedure at [facility]. We recommend you read the following instructions. If you have any questions or concerns, please call the office at [phone].  1. Shower and wash the entire body with soap and water the day of your surgery paying special attention to cleansing at and around the planned surgery site.  2. Avoid aspirin or aspirin containing products at least fourteen (14) days prior to your surgical procedure and for at least one week (7 Days) after your surgical procedure. If you take aspirin on a regular basis for heart disease or history of stroke or for any other reason, we may recommend you continue taking aspirin but please notify us if you take this on a regular basis. Aspirin can cause more bleeding to occur during surgery as well as prolonged bleeding and bruising after surgery.   3. Avoid other nonsteroidal pain medications at least one week prior to surgery and at least one week prior to your surgery. These include medications such as Ibuprofen (Motrin, Advil and Nuprin), Naprosyn, Voltaren, Relafen, etc. If medications are used for therapeutic reasons, please inform us as they can cause increased bleeding or prolonged bleeding during and bruising after surgical procedures.   4. Please advice [place] if you are taking any "blood thinner" medications such as Coumadin or Dipyridamole or Plavix or similar medications. These cause increased bleeding and prolonged bleeding during and bruising after surgical procedures. We may have to consider discontinuing these medications briefly prior to and shortly after your surgery, if safe to do so.   5. Please inform us of all medications you are currently taking. All medications that are taken regularly should be taken the day of surgery as you always do. Nevertheless, we need to be informed of what medications you are taking prior to surgery to whether they will affect the  procedure or cause any complications.   6. Please inform us of any medication allergies. Also inform us of whether you have allergies to Latex or rubber products or whether you have had any adverse reaction to Lidocaine or Epinephrine.  7. Please inform us of any prosthetic or artificial body parts such as artificial heart valve, joint replacements, etc., or similar condition that might require preoperative antibiotics.   8. We recommend avoidance of alcohol at least two weeks prior to surgery and continued avoidence for at least two weeks after surgery.   9. We recommend discontinuation of tobacco smoking at least two weeks prior to surgery and continued abstinence for at least two weeks after surgery.  10. Do not plan strenuous exercise, strenuous work or strenuous lifting for approximately four weeks after your surgery.   11. We request if you are unable to make your scheduled surgical appointment, please call us at least a week in advance or as soon as you are aware of a problem sot aht we can cancel or reschedule you.   12. You MAKE TAKE TYLENOL (acetaminophen) for pain as it is not a blood thinner.   13. PLEASE PLAN TO BE IN TOWN FOR TWO WEEKS FOLLOWING SURGERY, THIS IS IMPORTANT SO YOU CAN BE CHECKED FOR DRESSING CHANGES, SUTURE REMOVAL AND TO MONITOR FOR POSSIBLE COMPLICATIONS.  14.

## 2020-05-29 ENCOUNTER — Encounter: Payer: Self-pay | Admitting: Dermatology

## 2020-06-07 ENCOUNTER — Other Ambulatory Visit: Payer: Self-pay | Admitting: Rheumatology

## 2020-06-07 DIAGNOSIS — M2550 Pain in unspecified joint: Secondary | ICD-10-CM

## 2020-06-08 ENCOUNTER — Ambulatory Visit
Admission: RE | Admit: 2020-06-08 | Discharge: 2020-06-08 | Disposition: A | Discharge status: Home / Self Care | Payer: BC Managed Care – PPO | Source: Ambulatory Visit | Attending: Nurse Practitioner | Admitting: Nurse Practitioner

## 2020-06-08 ENCOUNTER — Other Ambulatory Visit: Payer: Self-pay

## 2020-06-08 DIAGNOSIS — Z1231 Encounter for screening mammogram for malignant neoplasm of breast: Secondary | ICD-10-CM | POA: Insufficient documentation

## 2020-06-14 ENCOUNTER — Ambulatory Visit: Payer: BC Managed Care – PPO

## 2020-06-20 ENCOUNTER — Telehealth: Payer: Self-pay | Admitting: Internal Medicine

## 2020-06-20 ENCOUNTER — Other Ambulatory Visit: Payer: Self-pay

## 2020-06-20 MED ORDER — CEPHALEXIN 500 MG PO CAPS: 500.0000 mg | ORAL_CAPSULE | Freq: Three times a day (TID) | ORAL | 0 refills | Status: AC

## 2020-06-20 NOTE — Telephone Encounter (Signed)
Pt advised that we send antibiotic to phar for 7 days also let us if any changes on her wound

## 2020-06-20 NOTE — Telephone Encounter (Signed)
Pt advised that we called in  [facility]  pharmacy for wound on foot from stepping on a new nail, not rusted, no puss or ooze, very swollen and painful, her pharmacy will give her a tetnus shot today             [facility] [address]. Patient has a video visit tomorrow to follow up with [place]. Please send her RX into pharmacy this morning. Thanks. Beth

## 2020-06-21 ENCOUNTER — Other Ambulatory Visit: Payer: Self-pay

## 2020-06-21 ENCOUNTER — Encounter
Admission: RE | Admit: 2020-06-21 | Discharge: 2020-06-21 | Disposition: A | Discharge status: Home / Self Care | Payer: BC Managed Care – PPO | Source: Ambulatory Visit | Attending: Rheumatology | Admitting: Rheumatology

## 2020-06-21 ENCOUNTER — Ambulatory Visit: Payer: BC Managed Care – PPO

## 2020-06-21 ENCOUNTER — Encounter: Payer: Self-pay | Admitting: Internal Medicine

## 2020-06-21 ENCOUNTER — Ambulatory Visit: Payer: BC Managed Care – PPO | Admitting: Internal Medicine

## 2020-06-21 VITALS — Resp 16 | Ht 64.0 in | Wt 156.0 lb

## 2020-06-21 DIAGNOSIS — M2550 Pain in unspecified joint: Secondary | ICD-10-CM | POA: Diagnosis not present

## 2020-06-21 DIAGNOSIS — T148XXA Other injury of unspecified body region, initial encounter: Secondary | ICD-10-CM

## 2020-06-21 DIAGNOSIS — L03119 Cellulitis of unspecified part of limb: Secondary | ICD-10-CM | POA: Diagnosis not present

## 2020-06-21 MED ORDER — TECHNETIUM TC 99M MEDRONATE IV KIT
23.5900 | PACK | Freq: Once | INTRAVENOUS | Status: AC | PRN
  Administered 2020-06-21: 23.59 via INTRAVENOUS

## 2020-06-21 NOTE — Progress Notes (Signed)
[facility] [place], [place] [address]  Internal MEDICINE  Telephone Visit  Patient Name: Andrea Bernard  [id number]  [id number]  Date of Service: 06/21/2020  I connected with the patient at 10:45 by telephone and verified the patients identity using two identifiers.   I discussed the limitations, risks, security and privacy concerns of performing an evaluation and management service by telephone and the availability of in person appointments. I also discussed with the patient that there may be a patient responsible charge related to the service.  The patient expressed understanding and agrees to proceed.    Chief Complaint  Patient presents with  . Telephone Assessment    video, [phone]  . Telephone Screen  . Puncture Wound    from a new nail 3 inches, side ways to toe area on right foot, got tetanus shot yesterday at [facility]    HPI Pt is connected for acute visit. Stepped on a new nail yesterday, went deep in her foot. Got her tetanus booster from her pharmacy. Was started on Keflex as well. She is c/o pain and swelling, denies any fever and chills. She has a working dx of RA or other connective tissue disease from Rheumatology, scheduled to get a bone scan    Current Medication: Outpatient Encounter Medications as of 06/21/2020  Medication Sig  . acetaminophen (TYLENOL) 325 MG tablet Take 650 mg by mouth.  . cephALEXin (KEFLEX) 500 MG capsule Take 1 capsule (500 mg total) by mouth 3 (three) times daily for 7 days.  . cyclobenzaprine (FLEXERIL) 10 MG tablet Take 10 mg by mouth daily.  . famotidine (PEPCID) 20 MG tablet Take 1 tablet (20 mg total) by mouth daily.  [name] gabapentin (NEURONTIN) 300 MG capsule Take 300 mg by mouth 3 (three) times daily.  . OXYCONTIN 80 MG 12 hr tablet Take 80 mg by mouth every 12 (twelve) hours.  . pantoprazole (PROTONIX) 40 MG tablet Take 1 tablet (40 mg total) by mouth 2 (two) times daily.  . SUMAtriptan (IMITREX) 100 MG  tablet Take 1 tablet (100 mg total) by mouth daily as needed for migraine.  [name] amitriptyline (ELAVIL) 25 MG tablet Take by mouth.  . [DISCONTINUED] amoxicillin (AMOXIL) 500 MG capsule Take 1 capsule (500 mg total) by mouth 2 (two) times daily. (Patient not taking: Reported on 06/21/2020)  . [DISCONTINUED] fluconazole (DIFLUCAN) 150 MG tablet Take 1 tablet po every 3 days while on antibiotics. (Patient not taking: Reported on 06/21/2020)  . [DISCONTINUED] phentermine (ADIPEX-P) 37.5 MG tablet Take 1 tablet (37.5 mg total) by mouth daily before breakfast. (Patient not taking: Reported on 06/21/2020)   Facility-Administered Encounter Medications as of 06/21/2020  Medication  . iron sucrose (VENOFER) 200 mg IVPB  . sodium chloride flush (NS) 0.9 % injection 10 mL  . [COMPLETED] technetium medronate (TC-MDP) injection 12.45 millicurie    Surgical History: Past Surgical History:  Procedure Laterality Date  . carpel tunnel release    . CESAREAN SECTION     x 4  . COLONOSCOPY WITH PROPOFOL N/A 09/04/2017   Procedure: COLONOSCOPY WITH PROPOFOL;  Surgeon: [name], MD;  Location: [facility] ENDOSCOPY;  Service: Gastroenterology;  Laterality: N/A;  . COLONOSCOPY WITH PROPOFOL N/A 12/21/2017   Procedure: COLONOSCOPY WITH PROPOFOL;  Surgeon: [name], MD;  Location: [facility] ENDOSCOPY;  Service: Gastroenterology;  Laterality: N/A;  . ESOPHAGOGASTRODUODENOSCOPY (EGD) WITH PROPOFOL N/A 09/04/2017   Procedure: ESOPHAGOGASTRODUODENOSCOPY (EGD) WITH PROPOFOL;  Surgeon: [name], MD;  Location: [facility] ENDOSCOPY;  Service: Gastroenterology;  Laterality: N/A;  . HERNIA REPAIR    . TUBAL LIGATION    . TUMOR REMOVAL      Medical History: Past Medical History:  Diagnosis Date  . Arthritis   . Dysplastic nevus 03/02/2008   Right lateral thigh. Moderate atypia, limited margins free.  [name] GERD (gastroesophageal reflux disease)   . Headache   . Iron deficiency anemia 06/15/2017  . Neurofibromatosis ([place])      Family History: Family History  Problem Relation Age of Onset  . Anemia Mother   . Bladder Cancer Neg Hx   . Kidney cancer Neg Hx   . Breast cancer Neg Hx   . Ovarian cancer Neg Hx   . Colon cancer Neg Hx     Social History   Socioeconomic History  . Marital status: Married    Spouse name: Not on file  . Number of children: Not on file  . Years of education: Not on file  . Highest education level: Not on file  Occupational History  . Not on file  Tobacco Use  . Smoking status: Never Smoker  . Smokeless tobacco: Never Used  Vaping Use  . Vaping Use: Never used  Substance and Sexual Activity  . Alcohol use: No  . Drug use: Not Currently  . Sexual activity: Yes    Birth control/protection: Surgical  Other Topics Concern  . Not on file  Social History Narrative  . Not on file   Social Determinants of Health   Financial Resource Strain:   . Difficulty of Paying Living Expenses: Not on file  Food Insecurity:   . Worried About Charity fundraiser in the Last Year: Not on file  . Ran Out of Food in the Last Year: Not on file  Transportation Needs:   . Lack of Transportation (Medical): Not on file  . Lack of Transportation (Non-Medical): Not on file  Physical Activity:   . Days of Exercise per Week: Not on file  . Minutes of Exercise per Session: Not on file  Stress:   . Feeling of Stress : Not on file  Social Connections:   . Frequency of Communication with Friends and Family: Not on file  . Frequency of Social Gatherings with Friends and Family: Not on file  . Attends Religious Services: Not on file  . Active Member of Clubs or Organizations: Not on file  . Attends Archivist Meetings: Not on file  . Marital Status: Not on file  Intimate Partner Violence:   . Fear of Current or Ex-Partner: Not on file  . Emotionally Abused: Not on file  . Physically Abused: Not on file  . Sexually Abused: Not on file    Review of Systems  Constitutional:  Negative for chills and fever.  Respiratory: Negative.   Cardiovascular: Negative.   Musculoskeletal: Positive for neck stiffness. Negative for arthralgias.  Skin: Positive for wound.       Pain and swelling     Vital Signs: Resp 16   Ht 5\' 4"  (1.626 m)   Wt 156 lb (70.8 kg)   LMP 12/18/2017   BMI 26.78 kg/m   Observation/Objective: Pt looks comfortable. Foot does show edema and puncture wound   Assessment/Plan: 1. Puncture wound Pt has already received her tetanus booster, She has Celebrex at home   2. Cellulitis of foot Continue keflex 500 mg tid, will call the office tomorrow to update, instructed her monitor pain and swelling, fever or any other  systemic symptoms   General Counseling: mariaeduarda defranco understanding of the findings of today's phone visit and agrees with plan of treatment. I have discussed any further diagnostic evaluation that may be needed or ordered today. We also reviewed her medications today. she has been encouraged to call the office with any questions or concerns that should arise related to todays visit.   Time spent:15 Minutes  Dr [name] Internal medicine

## 2020-06-22 ENCOUNTER — Telehealth: Payer: Self-pay

## 2020-06-26 ENCOUNTER — Other Ambulatory Visit: Payer: Self-pay

## 2020-06-26 ENCOUNTER — Ambulatory Visit: Payer: BC Managed Care – PPO | Admitting: Internal Medicine

## 2020-06-26 VITALS — BP 106/70 | HR 57 | Temp 97.2°F | Resp 16 | Ht 64.0 in | Wt 164.2 lb

## 2020-06-26 DIAGNOSIS — B3731 Acute candidiasis of vulva and vagina: Secondary | ICD-10-CM

## 2020-06-26 DIAGNOSIS — L02619 Cutaneous abscess of unspecified foot: Secondary | ICD-10-CM | POA: Diagnosis not present

## 2020-06-26 DIAGNOSIS — L03119 Cellulitis of unspecified part of limb: Secondary | ICD-10-CM | POA: Diagnosis not present

## 2020-06-26 DIAGNOSIS — B373 Candidiasis of vulva and vagina: Secondary | ICD-10-CM | POA: Diagnosis not present

## 2020-06-26 DIAGNOSIS — T148XXA Other injury of unspecified body region, initial encounter: Secondary | ICD-10-CM

## 2020-06-26 MED ORDER — AMOXICILLIN-POT CLAVULANATE 875-125 MG PO TABS: 1.0000 | ORAL_TABLET | Freq: Two times a day (BID) | ORAL | 0 refills | Status: DC

## 2020-06-26 MED ORDER — FLUCONAZOLE 150 MG PO TABS: 150.0000 mg | ORAL_TABLET | Freq: Once | ORAL | 0 refills | Status: AC

## 2020-06-26 NOTE — Progress Notes (Signed)
[facility] [place],  [address]  Internal MEDICINE  Office Visit Note  Patient Name: Andrea Bernard  [id number]  [id number]  Date of Service: 07/02/2020  Chief Complaint  Patient presents with  . Acute Visit    right foot swollen, feels like big toe is up but it is not  . Quality Metric Gaps    hepC    HPI  Pt is seen as a follow up to virtual visit for cellulitis of right foot. She had a puncture wound of right sole as a result of penetration by a nail. Nail was new and no rust was seen, she did get her tetanus booster. C/o pain in her toes. Swelling is present as well as some heat to the area. Denies any fever or chills  She is on keflex and Celebrex. She will like to have diflucan as can develop vaginal yeast infection   Current Medication: Outpatient Encounter Medications as of 06/26/2020  Medication Sig  . acetaminophen (TYLENOL) 325 MG tablet Take 650 mg by mouth.  . [EXPIRED] cephALEXin (KEFLEX) 500 MG capsule Take 1 capsule (500 mg total) by mouth 3 (three) times daily for 7 days.  . cyclobenzaprine (FLEXERIL) 10 MG tablet Take 10 mg by mouth daily.  . famotidine (PEPCID) 20 MG tablet Take 1 tablet (20 mg total) by mouth daily.  [name] gabapentin (NEURONTIN) 300 MG capsule Take 300 mg by mouth 3 (three) times daily.  . OXYCONTIN 80 MG 12 hr tablet Take 80 mg by mouth every 12 (twelve) hours.  . pantoprazole (PROTONIX) 40 MG tablet Take 1 tablet (40 mg total) by mouth 2 (two) times daily.  . SUMAtriptan (IMITREX) 100 MG tablet Take 1 tablet (100 mg total) by mouth daily as needed for migraine.  [name] amitriptyline (ELAVIL) 25 MG tablet Take by mouth.  [name] amoxicillin-clavulanate (AUGMENTIN) 875-125 MG tablet Take 1 tablet by mouth 2 (two) times daily.  . [EXPIRED] fluconazole (DIFLUCAN) 150 MG tablet Take 1 tablet (150 mg total) by mouth once for 1 dose. And prn   Facility-Administered Encounter Medications as of 06/26/2020  Medication  . iron  sucrose (VENOFER) 200 mg IVPB  . sodium chloride flush (NS) 0.9 % injection 10 mL    Surgical History: Past Surgical History:  Procedure Laterality Date  . carpel tunnel release    . CESAREAN SECTION     x 4  . COLONOSCOPY WITH PROPOFOL N/A 09/04/2017   Procedure: COLONOSCOPY WITH PROPOFOL;  Surgeon: [name], MD;  Location: [facility] ENDOSCOPY;  Service: Gastroenterology;  Laterality: N/A;  . COLONOSCOPY WITH PROPOFOL N/A 12/21/2017   Procedure: COLONOSCOPY WITH PROPOFOL;  Surgeon: [name], MD;  Location: [facility] ENDOSCOPY;  Service: Gastroenterology;  Laterality: N/A;  . ESOPHAGOGASTRODUODENOSCOPY (EGD) WITH PROPOFOL N/A 09/04/2017   Procedure: ESOPHAGOGASTRODUODENOSCOPY (EGD) WITH PROPOFOL;  Surgeon: [name], MD;  Location: [facility] ENDOSCOPY;  Service: Gastroenterology;  Laterality: N/A;  . HERNIA REPAIR    . TUBAL LIGATION    . TUMOR REMOVAL      Medical History: Past Medical History:  Diagnosis Date  . Arthritis   . Dysplastic nevus 03/02/2008   Right lateral thigh. Moderate atypia, limited margins free.  [name] GERD (gastroesophageal reflux disease)   . Headache   . Iron deficiency anemia 06/15/2017  . Neurofibromatosis ([place])     Family History: Family History  Problem Relation Age of Onset  . Anemia Mother   . Bladder Cancer Neg Hx   . Kidney cancer Neg  Hx   . Breast cancer Neg Hx   . Ovarian cancer Neg Hx   . Colon cancer Neg Hx     Social History   Socioeconomic History  . Marital status: Married    Spouse name: Not on file  . Number of children: Not on file  . Years of education: Not on file  . Highest education level: Not on file  Occupational History  . Not on file  Tobacco Use  . Smoking status: Never Smoker  . Smokeless tobacco: Never Used  Vaping Use  . Vaping Use: Never used  Substance and Sexual Activity  . Alcohol use: No  . Drug use: Not Currently  . Sexual activity: Yes    Birth control/protection: Surgical  Other Topics Concern  . Not on  file  Social History Narrative  . Not on file   Social Determinants of Health   Financial Resource Strain:   . Difficulty of Paying Living Expenses: Not on file  Food Insecurity:   . Worried About Charity fundraiser in the Last Year: Not on file  . Ran Out of Food in the Last Year: Not on file  Transportation Needs:   . Lack of Transportation (Medical): Not on file  . Lack of Transportation (Non-Medical): Not on file  Physical Activity:   . Days of Exercise per Week: Not on file  . Minutes of Exercise per Session: Not on file  Stress:   . Feeling of Stress : Not on file  Social Connections:   . Frequency of Communication with Friends and Family: Not on file  . Frequency of Social Gatherings with Friends and Family: Not on file  . Attends Religious Services: Not on file  . Active Member of Clubs or Organizations: Not on file  . Attends Archivist Meetings: Not on file  . Marital Status: Not on file  Intimate Partner Violence:   . Fear of Current or Ex-Partner: Not on file  . Emotionally Abused: Not on file  . Physically Abused: Not on file  . Sexually Abused: Not on file      Review of Systems  Constitutional: Negative for fatigue and fever.  Respiratory: Negative for cough and chest tightness.   Cardiovascular: Negative for chest pain.  Gastrointestinal: Positive for abdominal pain.  Musculoskeletal: Positive for arthralgias, joint swelling and myalgias.  Neurological: Negative for tremors.  Psychiatric/Behavioral: Negative.     Vital Signs: BP 106/70   Pulse (!) 57   Temp (!) 97.2 F (36.2 C)   Resp 16   Ht 5' 4"  (1.626 m)   Wt 164 lb 3.2 oz (74.5 kg)   LMP 12/18/2017   SpO2 98%   BMI 28.18 kg/m    Physical Exam Constitutional:      Appearance: Normal appearance.  HENT:     Head: Normocephalic and atraumatic.  Eyes:     Extraocular Movements: Extraocular movements intact.     Pupils: Pupils are equal, round, and reactive to light.   Cardiovascular:     Rate and Rhythm: Normal rate and regular rhythm.     Pulses: Normal pulses.     Heart sounds: Normal heart sounds.  Pulmonary:     Effort: Pulmonary effort is normal.     Breath sounds: Normal breath sounds.  Musculoskeletal:        General: Swelling present.  Skin:    General: Skin is warm.     Findings: Erythema present.     Comments: Dry  punctured wound with surrounding soft tissue swelling?? Bone and tendon Involvement   Neurological:     Mental Status: She is alert.     Assessment/Plan: 1. Cellulitis and abscess of foot, except toes Will dc Keflex and start Augmentin for wider coverage as the are does have swelling and is warm to touch  - Sed Rate (ESR) - Uric acid - CBC w/Diff/Platelet - amoxicillin-clavulanate (AUGMENTIN) 875-125 MG tablet; Take 1 tablet by mouth 2 (two) times daily.  Dispense: 20 tablet; Refill: 0  2. Puncture wound Continue to monitor, pt is in [place] boot right now   3. Vaginal yeast infection Preventive therapy  - fluconazole (DIFLUCAN) 150 MG tablet; Take 1 tablet (150 mg total) by mouth once for 1 dose. And prn  Dispense: 1 tablet; Refill: 0  General Counseling: Thuy verbalizes understanding of the findings of todays visit and agrees with plan of treatment. I have discussed any further diagnostic evaluation that may be needed or ordered today. We also reviewed her medications today. she has been encouraged to call the office with any questions or concerns that should arise related to todays visit. Orders Placed This Encounter  Procedures  . Sed Rate (ESR)  . Uric acid  . CBC w/Diff/Platelet    Meds ordered this encounter  Medications  . amoxicillin-clavulanate (AUGMENTIN) 875-125 MG tablet    Sig: Take 1 tablet by mouth 2 (two) times daily.    Dispense:  20 tablet    Refill:  0  . fluconazole (DIFLUCAN) 150 MG tablet    Sig: Take 1 tablet (150 mg total) by mouth once for 1 dose. And prn    Dispense:  1 tablet     Refill:  0    Total time spent:30 Minutes Time spent includes review of chart, medications, test results, and follow up plan with the patient.      Dr [name] Internal medicine

## 2020-06-27 LAB — CBC WITH DIFFERENTIAL/PLATELET
Basophils Absolute: 0.1 10*3/uL (ref 0.0–0.2)
Basos: 1 %
EOS (ABSOLUTE): 0.4 10*3/uL (ref 0.0–0.4)
Eos: 6 %
Hematocrit: 39.7 % (ref 34.0–46.6)
Hemoglobin: 13 g/dL (ref 11.1–15.9)
Immature Grans (Abs): 0 10*3/uL (ref 0.0–0.1)
Immature Granulocytes: 0 %
Lymphocytes Absolute: 2.3 10*3/uL (ref 0.7–3.1)
Lymphs: 37 %
MCH: 28.6 pg (ref 26.6–33.0)
MCHC: 32.7 g/dL (ref 31.5–35.7)
MCV: 87 fL (ref 79–97)
Monocytes Absolute: 0.5 10*3/uL (ref 0.1–0.9)
Monocytes: 8 %
Neutrophils Absolute: 2.8 10*3/uL (ref 1.4–7.0)
Neutrophils: 48 %
Platelets: 267 10*3/uL (ref 150–450)
RBC: 4.55 x10E6/uL (ref 3.77–5.28)
RDW: 12.4 % (ref 11.7–15.4)
WBC: 6.1 10*3/uL (ref 3.4–10.8)

## 2020-06-27 LAB — URIC ACID: Uric Acid: 4.5 mg/dL (ref 3.0–7.2)

## 2020-06-27 LAB — SEDIMENTATION RATE: Sed Rate: 13 mm/hr (ref 0–40)

## 2020-07-10 ENCOUNTER — Encounter: Payer: BC Managed Care – PPO | Admitting: Dermatology

## 2020-07-20 ENCOUNTER — Other Ambulatory Visit: Payer: Self-pay | Admitting: Adult Health

## 2020-07-20 DIAGNOSIS — G43909 Migraine, unspecified, not intractable, without status migrainosus: Secondary | ICD-10-CM

## 2020-08-07 ENCOUNTER — Encounter: Payer: BC Managed Care – PPO | Admitting: Nurse Practitioner

## 2020-08-09 ENCOUNTER — Ambulatory Visit: Payer: Self-pay | Admitting: Surgery

## 2020-08-09 NOTE — H&P (Signed)
Andrea Bernard Appointment: 08/09/2020 9:15 AM Location: [facility] Patient #: [id number] DOB: 1965-11-23 Married / Language: Cleophus Molt / Race: White Female   History of Present Illness Andrea Morn MD; 08/09/2020 9:46 AM) Patient words: [name] is a 54 year old female presents in office due to a recurrent incisional hernia. Patient states she had a umbilical hernia repair with mesh 10 or 15 years ago at [facility]. She was told that time they used mesh, however we do not have the records from this surgery at this time. She has done well for some time, but has noticed over the last few years the development of a new bulge at the umbilicus. She has neurofibromatosis and developed multiple benign growths in her subcutaneous tissues all over her body including her abdominal wall. She thought at first this was another benign growth, however she notices bulging out and it was more painful than others. Hernias got a point where its affected her life and she wants it repaired. She denies any nausea or vomiting, or up tract symptoms related to the hernia. Notably she had previous cesarean sections including 1, care by wound Caucasian which required secondary healing of the open Pfannenstiel incision. She's not happy with the cosmetic appearance of her lower abdominal wall and is interested in seeing a plastic surgeon for possible panniculectomy at the time of hernia repair.  The patient is a 54 year old female.   Allergies (Andrea Bernard, [place]; 08/09/2020 9:20 AM) No Known Drug Allergies  [12/11/2017]: Allergies Reconciled   Medication History (Andrea Bernard, RMA; 08/09/2020 9:20 AM) Gabapentin (300MG  Capsule, Oral) Active. Tylenol (Oral) Specific strength unknown - Active. Cyclobenzaprine HCl (10MG  Tablet, Oral) Active. Pantoprazole Sodium (40MG  Tablet DR, Oral) Active. Medications Reconciled    Review of Systems Andrea Morn MD; 08/09/2020 9:46  AM) General Not Present- Appetite Loss, Chills, Fatigue, Fever, Night Sweats, Weight Gain and Weight Loss. Note: All other systems negative (unless as noted in HPI & included Review of Systems) Skin Not Present- Change in Wart/Mole, Dryness, Hives, Jaundice, New Lesions, Non-Healing Wounds, Rash and Ulcer. HEENT Not Present- Earache, Hearing Loss, Hoarseness, Nose Bleed, Oral Ulcers, Ringing in the Ears, Seasonal Allergies, Sinus Pain, Sore Throat, Visual Disturbances, Wears glasses/contact lenses and Yellow Eyes. Respiratory Not Present- Bloody sputum, Chronic Cough, Difficulty Breathing, Snoring and Wheezing. Breast Not Present- Breast Mass, Breast Pain, Nipple Discharge and Skin Changes. Cardiovascular Not Present- Chest Pain, Difficulty Breathing Lying Down, Leg Cramps, Palpitations, Rapid Heart Rate, Shortness of Breath and Swelling of Extremities. Gastrointestinal Not Present- Abdominal Pain, Bloating, Bloody Stool, Change in Bowel Habits, Chronic diarrhea, Constipation, Difficulty Swallowing, Excessive gas, Gets full quickly at meals, Hemorrhoids, Indigestion, Nausea, Rectal Pain and Vomiting. Female Genitourinary Not Present- Frequency, Nocturia, Painful Urination, Pelvic Pain and Urgency. Musculoskeletal Not Present- Back Pain, Joint Pain, Joint Stiffness, Muscle Pain, Muscle Weakness and Swelling of Extremities. Neurological Not Present- Decreased Memory, Fainting, Headaches, Numbness, Seizures, Tingling, Tremor, Trouble walking and Weakness. Psychiatric Not Present- Anxiety, Bipolar, Change in Sleep Pattern, Depression, Fearful and Frequent crying. Endocrine Not Present- Cold Intolerance, Excessive Hunger, Hair Changes, Heat Intolerance, Hot flashes and New Diabetes. Hematology Not Present- Easy Bruising, Excessive bleeding, Gland problems, HIV and Persistent Infections.  Vitals (Andrea Bernard RMA; 08/09/2020 9:21 AM) 08/09/2020 9:20 AM Weight: 166.4 lb Height: 64in Body Surface  Area: 1.81 m Body Mass Index: 28.56 kg/m  Temp.: 18F  Pulse: 77 (Regular)  BP: 116/78(Sitting, Left Arm, Standard)  Physical Exam Andrea Morn MD; 08/09/2020 9:49 AM) General Mental Status-Alert. General Appearance-Consistent with stated age. Posture-Normal posture. Voice-Normal.  Integumentary Note: Multiple selective nodules palpable throughout her body, multiple previous scars well-healed from removal of these nodules   Head and Neck Head -Note: atraumatic, normocephalic.  Face Strength and Tone - facial muscle strength and tone is normal.  Eye Eyeball - Bilateral-Extraocular movements intact. Sclera/Conjunctiva - Bilateral-Normal.  Chest and Lung Exam Chest and lung exam reveals -quiet, even and easy respiratory effort with no use of accessory muscles.  Abdomen Note: Soft, palpable subcutaneous nodules. Difficult to palpate recurrence of a hernia near the umbilicus. Difficult to palpate fascial edges. Patient examined lying down and standing up and bearing down. Lower Pfannenstiel incision scarred with some depression of the skin.   Neuropsychiatric Mental status exam performed with findings of-able to articulate well with normal speech/language, rate, volume and coherence. The patient's mood and affect are described as -normal. Judgment and Insight-insight is appropriate concerning matters relevant to self and the patient displays appropriate judgment regarding every day activities. Thought Processes/Cognitive Function-aware of current events.  Musculoskeletal Note: strength symmetrical throughout, no deformity     Assessment & Plan Andrea Morn MD; 08/09/2020 9:51 AM) RECURRENT VENTRAL INCISIONAL HERNIA (K43.2) Impression: Reviewed CT scan images from 11/04/2017. Reviewed ultrasound report in care everywhere 07/21/2017.  Refer to plastic surgery - interested in panniculectomy at time of hernia  repair Plan for combined case with plastic surgery (hernia repair at time of panniculectomy) vs. open recurrent ventral hernia repair with mesh without panniculectomy depending on plastic surgeons opinion on this patient with neurofibromatosis.  Surgery scheduler will call you to schedule surgery after plastic surgery consultation.  Signed by Andrea Morn, MD (08/09/2020 9:52 AM)

## 2020-08-21 ENCOUNTER — Other Ambulatory Visit: Payer: Self-pay

## 2020-08-21 MED ORDER — FAMOTIDINE 20 MG PO TABS
20.0000 mg | ORAL_TABLET | Freq: Every day | ORAL | 3 refills | Status: DC
Start: 2020-08-21 — End: 2020-10-03

## 2020-08-23 ENCOUNTER — Ambulatory Visit (INDEPENDENT_AMBULATORY_CARE_PROVIDER_SITE_OTHER): Payer: BC Managed Care – PPO | Admitting: Plastic Surgery

## 2020-08-23 ENCOUNTER — Encounter: Payer: Self-pay | Admitting: Plastic Surgery

## 2020-08-23 ENCOUNTER — Other Ambulatory Visit: Payer: Self-pay

## 2020-08-23 VITALS — BP 114/70 | HR 57 | Temp 98.1°F | Ht 64.0 in | Wt 169.2 lb

## 2020-08-23 DIAGNOSIS — M793 Panniculitis, unspecified: Secondary | ICD-10-CM | POA: Diagnosis not present

## 2020-08-23 NOTE — Progress Notes (Signed)
Referring Provider [name], MD [address] [place],  [place] [address]   CC:  Chief Complaint  Patient presents with  . Consult      Andrea Bernard is an 54 y.o. female.  HPI: Patient presents to discuss panniculectomy in conjunction with ventral hernia repair.  She has a ventral hernia repair that is going to be repaired by her general surgeon and she is interested in having a panniculectomy at the same time.  She has had years of rashes beneath the crease and skin irritation has been refractory to over-the-counter medications.  Additionally she has neurofibromatosis and has several neurofibromas within the pannus itself that are growing and are painful.  Other abdominal surgeries include several C-sections and she is bothered by the tethering of the C-section scar.  She does not smoke and is not diabetic.  Allergies  Allergen Reactions  . Nitrofuran Derivatives Nausea And Vomiting    Outpatient Encounter Medications as of 08/23/2020  Medication Sig  . acetaminophen (TYLENOL) 325 MG tablet Take 650 mg by mouth.  . cyclobenzaprine (FLEXERIL) 10 MG tablet Take 10 mg by mouth daily.  . famotidine (PEPCID) 20 MG tablet Take 1 tablet (20 mg total) by mouth daily.  [name] gabapentin (NEURONTIN) 300 MG capsule Take 300 mg by mouth 3 (three) times daily.  . OXYCONTIN 80 MG 12 hr tablet Take 80 mg by mouth every 12 (twelve) hours.  . pantoprazole (PROTONIX) 40 MG tablet Take 1 tablet (40 mg total) by mouth 2 (two) times daily.  . SUMAtriptan (IMITREX) 100 MG tablet TAKE 1 TABLET (100 MG TOTAL) BY MOUTH DAILY AS NEEDED FOR MIGRAINE.  [name] amitriptyline (ELAVIL) 25 MG tablet Take by mouth.  [name] amoxicillin-clavulanate (AUGMENTIN) 875-125 MG tablet Take 1 tablet by mouth 2 (two) times daily. (Patient not taking: Reported on 08/23/2020)   Facility-Administered Encounter Medications as of 08/23/2020  Medication  . iron sucrose (VENOFER) 200 mg IVPB  . sodium chloride flush (NS) 0.9 %  injection 10 mL     Past Medical History:  Diagnosis Date  . Arthritis   . Dysplastic nevus 03/02/2008   Right lateral thigh. Moderate atypia, limited margins free.  [name] GERD (gastroesophageal reflux disease)   . Headache   . Iron deficiency anemia 06/15/2017  . Neurofibromatosis [facility])     Past Surgical History:  Procedure Laterality Date  . carpel tunnel release    . CESAREAN SECTION     x 4  . COLONOSCOPY WITH PROPOFOL N/A 09/04/2017   Procedure: COLONOSCOPY WITH PROPOFOL;  Surgeon: [name], MD;  Location: [facility] ENDOSCOPY;  Service: Gastroenterology;  Laterality: N/A;  . COLONOSCOPY WITH PROPOFOL N/A 12/21/2017   Procedure: COLONOSCOPY WITH PROPOFOL;  Surgeon: [name], MD;  Location: [facility] ENDOSCOPY;  Service: Gastroenterology;  Laterality: N/A;  . ESOPHAGOGASTRODUODENOSCOPY (EGD) WITH PROPOFOL N/A 09/04/2017   Procedure: ESOPHAGOGASTRODUODENOSCOPY (EGD) WITH PROPOFOL;  Surgeon: [name], MD;  Location: [facility] ENDOSCOPY;  Service: Gastroenterology;  Laterality: N/A;  . HERNIA REPAIR    . TUBAL LIGATION    . TUMOR REMOVAL      Family History  Problem Relation Age of Onset  . Anemia Mother   . Bladder Cancer Neg Hx   . Kidney cancer Neg Hx   . Breast cancer Neg Hx   . Ovarian cancer Neg Hx   . Colon cancer Neg Hx     Social History   Social History Narrative  . Not on file     Review of  Systems General: Denies fevers, chills, weight loss CV: Denies chest pain, shortness of breath, palpitations  Physical Exam Vitals with BMI 08/23/2020 06/26/2020 06/21/2020  Height 5\' 4"  5\' 4"  5\' 4"   Weight 169 lbs 3 oz 164 lbs 3 oz 156 lbs  BMI 29.03 87.27 61.84  Systolic 859 276 -  Diastolic 70 70 -  Pulse 57 57 -    General:  No acute distress,  Alert and oriented, Non-Toxic, Normal speech and affect Abdomen: Abdomen is soft nontender.  She has what feels like a prominent hernia hernia defect in the periumbilical and superior to the umbilicus areas.  She has a lower  transverse scar with tethering.  She has numerous neurofibromas within the overhanging pannus.  There does appear to be signs of skin irritation in the crease beneath the pannus itself.  She has a mild amount of excess adipose tissue and a moderate amount of excess skin.  Assessment/Plan Patient is a reasonable candidate for panniculectomy.  I discussed the procedure with her in detail.  We discussed the risks include bleeding, infection, damage to surrounding structures and need for additional procedures.  I discussed the scars would be somewhat dependent upon what was required for her hernia repair.  I would try to keep the scar low in transverse but there may need to be a vertical component depending upon that hernia repair.  We discussed all the risk that include wound wound healing complications and the expected postoperative recovery.  I explained that I would need to use drains but they would hopefully be removed by 1 to 2 weeks postoperatively.  All her questions were answered and we will plan to coordinate this case.  [name] 08/23/2020, 11:05 AM

## 2020-09-04 ENCOUNTER — Telehealth: Payer: Self-pay

## 2020-09-04 ENCOUNTER — Other Ambulatory Visit: Payer: Self-pay

## 2020-09-04 ENCOUNTER — Ambulatory Visit (INDEPENDENT_AMBULATORY_CARE_PROVIDER_SITE_OTHER): Payer: BC Managed Care – PPO | Admitting: Dermatology

## 2020-09-04 DIAGNOSIS — D1739 Benign lipomatous neoplasm of skin and subcutaneous tissue of other sites: Secondary | ICD-10-CM | POA: Diagnosis not present

## 2020-09-04 DIAGNOSIS — D485 Neoplasm of uncertain behavior of skin: Secondary | ICD-10-CM | POA: Diagnosis not present

## 2020-09-04 DIAGNOSIS — D492 Neoplasm of unspecified behavior of bone, soft tissue, and skin: Secondary | ICD-10-CM

## 2020-09-04 DIAGNOSIS — D1724 Benign lipomatous neoplasm of skin and subcutaneous tissue of left leg: Secondary | ICD-10-CM

## 2020-09-04 MED ORDER — DOXYCYCLINE HYCLATE 100 MG PO TABS: ORAL_TABLET | ORAL | 0 refills | Status: DC

## 2020-09-04 MED ORDER — MUPIROCIN 2 % EX OINT: 1.0000 "application " | TOPICAL_OINTMENT | Freq: Every day | CUTANEOUS | 0 refills | Status: DC

## 2020-09-04 NOTE — Patient Instructions (Signed)

## 2020-09-04 NOTE — Progress Notes (Signed)
Follow-Up Visit   Subjective  Andrea Bernard is a 54 y.o. female who presents for the following: Lipoma (L distal anterior thigh) and fungus? (bil great toenails, 73m, vicks vapor rub).  The following portions of the chart were reviewed this encounter and updated as appropriate:  Tobacco  Allergies  Meds  Problems  Med Hx  Surg Hx  Fam Hx     Review of Systems:  No other skin or systemic complaints except as noted in HPI or Assessment and Plan.  Objective  Well appearing patient in no apparent distress; mood and affect are within normal limits.  A focused examination was performed including L leg. Relevant physical exam findings are noted in the Assessment and Plan.  Objective  Left ant distal thigh above the knee superior medial: Rubbery nodule 2.7     Objective  Left ant distal thigh above the knee inf medial: Rubbery nodule 2.5  Objective  Left ant distal thigh above the knee middle: Rubbery nodule 3.5cm  Objective  Left ant distal thigh above the knee inferior lateral: Rubbery nodule 3.1cm  Objective  Left hip: Rubbery nodule   Assessment & Plan    Possible Tinea of feet and toenails - - bil great toenails, advised pt will evaluate on post-op f/u.  Neoplasm of skin (4) - suspected symptomatic growing painful lipomas x4 of left thigh Left ant distal thigh above the knee superior medial  Skin excision  Lesion length (cm):  2.7 Lesion width (cm):  2.7 Margin per side (cm):  0 Total excision diameter (cm):  2.7 Informed consent: discussed and consent obtained   Timeout: patient name, date of birth, surgical site, and procedure verified   Procedure prep:  Patient was prepped and draped in usual sterile fashion Prep type:  Isopropyl alcohol and povidone-iodine Anesthesia: the lesion was anesthetized in a standard fashion   Anesthetic:  1% lidocaine w/ epinephrine 1-100,000 buffered w/ 8.4% NaHCO3 (3.0cc) Instrument used: #15 blade   Hemostasis  achieved with: pressure   Hemostasis achieved with comment:  Electrocautery Outcome: patient tolerated procedure well with no complications   Post-procedure details: sterile dressing applied and wound care instructions given   Dressing type: bandage and pressure dressing (Mupirocin)    Skin repair Complexity:  Complex Final length (cm):  1.2 Reason for type of repair: reduce tension to allow closure, reduce the risk of dehiscence, infection, and necrosis, reduce subcutaneous dead space and avoid a hematoma, allow closure of the large defect, preserve normal anatomy, preserve normal anatomical and functional relationships and enhance both functionality and cosmetic results   Undermining: area extensively undermined   Undermining comment:  Undermining Defect 2.7cm Subcutaneous layers (deep stitches):  Suture size:  3-0 Suture type: Vicryl (polyglactin 910)   Subcutaneous suture technique: Inverted Dermal. Fine/surface layer approximation (top stitches):  Suture size:  3-0 Suture type: nylon   Stitches: horizontal mattress   Suture removal (days):  7 Hemostasis achieved with: pressure Outcome: patient tolerated procedure well with no complications   Post-procedure details: sterile dressing applied and wound care instructions given   Dressing type: bandage, pressure dressing and bacitracin (Mupirocin)    doxycycline (VIBRA-TABS) 100 MG tablet  Specimen 1 - Surgical pathology Differential Diagnosis: D48.5 Lipoma vs other Check Margins: No Rubbery nodule 2.7cm  Left ant distal thigh above the knee inf medial  Skin excision  Lesion length (cm):  2.5 Lesion width (cm):  2.5 Margin per side (cm):  0 Total excision diameter (cm):  2.5 Informed  consent: discussed and consent obtained   Timeout: patient name, date of birth, surgical site, and procedure verified   Procedure prep:  Patient was prepped and draped in usual sterile fashion Prep type:  Isopropyl alcohol and  povidone-iodine Anesthesia: the lesion was anesthetized in a standard fashion   Anesthetic:  1% lidocaine w/ epinephrine 1-100,000 buffered w/ 8.4% NaHCO3 (3.0cc) Instrument used: #15 blade   Hemostasis achieved with: pressure   Outcome: patient tolerated procedure well with no complications   Post-procedure details: sterile dressing applied and wound care instructions given   Dressing type: bandage and pressure dressing (Mupirocin)    Skin repair Complexity:  Complex Final length (cm):  1.2 Reason for type of repair: reduce tension to allow closure, reduce the risk of dehiscence, infection, and necrosis, reduce subcutaneous dead space and avoid a hematoma, allow closure of the large defect, preserve normal anatomy, preserve normal anatomical and functional relationships and enhance both functionality and cosmetic results   Undermining: area extensively undermined   Undermining comment:  Undermining Defect 2.5cm Subcutaneous layers (deep stitches):  Suture size:  3-0 Suture type: Vicryl (polyglactin 910)   Subcutaneous suture technique: Inverted Dermal. Fine/surface layer approximation (top stitches):  Suture size:  3-0 Suture type: nylon   Stitches: horizontal mattress   Suture removal (days):  7 Hemostasis achieved with: pressure Outcome: patient tolerated procedure well with no complications   Post-procedure details: sterile dressing applied and wound care instructions given   Dressing type: bandage, pressure dressing and bacitracin (Mupirocin)    Specimen 2 - Surgical pathology Differential Diagnosis: D48.5 Lipoma vs other Check Margins: No Rubbery nodule 2.5  Left ant distal thigh above the knee middle  Skin excision  Lesion length (cm):  3.5 Lesion width (cm):  3.5 Margin per side (cm):  0 Total excision diameter (cm):  3.5 Informed consent: discussed and consent obtained   Timeout: patient name, date of birth, surgical site, and procedure verified   Procedure prep:   Patient was prepped and draped in usual sterile fashion Prep type:  Isopropyl alcohol and povidone-iodine Anesthesia: the lesion was anesthetized in a standard fashion   Anesthetic:  1% lidocaine w/ epinephrine 1-100,000 buffered w/ 8.4% NaHCO3 (5.0cc) Instrument used: #15 blade   Hemostasis achieved with: pressure   Outcome: patient tolerated procedure well with no complications   Post-procedure details: sterile dressing applied and wound care instructions given   Dressing type: bandage and pressure dressing (Mupirocin)    Skin repair Complexity:  Complex Final length (cm):  2 Reason for type of repair: reduce tension to allow closure, reduce the risk of dehiscence, infection, and necrosis, reduce subcutaneous dead space and avoid a hematoma, allow closure of the large defect, preserve normal anatomy, preserve normal anatomical and functional relationships and enhance both functionality and cosmetic results   Undermining: area extensively undermined   Undermining comment:  Undermining Defect 3.5cm Subcutaneous layers (deep stitches):  Suture size:  3-0 Suture type: Vicryl (polyglactin 910)   Subcutaneous suture technique: Inverted Dermal. Fine/surface layer approximation (top stitches):  Suture size:  3-0 Stitches: horizontal mattress   Suture removal (days):  7 Hemostasis achieved with: pressure Outcome: patient tolerated procedure well with no complications   Post-procedure details: sterile dressing applied and wound care instructions given   Dressing type: bandage, pressure dressing and bacitracin (Mupirocin)    Specimen 3 - Surgical pathology Differential Diagnosis: D48.5 Lipoma vs other Check Margins: No Rubbery nodule 3.5cm  Left ant distal thigh above the knee inferior lateral  Skin excision  Lesion length (cm):  3.1 Lesion width (cm):  3.1 Margin per side (cm):  0 Total excision diameter (cm):  3.1 Informed consent: discussed and consent obtained   Timeout: patient  name, date of birth, surgical site, and procedure verified   Procedure prep:  Patient was prepped and draped in usual sterile fashion Prep type:  Isopropyl alcohol and povidone-iodine Anesthesia: the lesion was anesthetized in a standard fashion   Anesthetic:  1% lidocaine w/ epinephrine 1-100,000 buffered w/ 8.4% NaHCO3 (5.0cc) Instrument used: #15 blade   Hemostasis achieved with: pressure   Outcome: patient tolerated procedure well with no complications   Post-procedure details: sterile dressing applied and wound care instructions given   Dressing type: bandage and pressure dressing (Mupirocin)    Skin repair Complexity:  Complex Final length (cm):  1.2 Reason for type of repair: reduce tension to allow closure, reduce the risk of dehiscence, infection, and necrosis, reduce subcutaneous dead space and avoid a hematoma, allow closure of the large defect, preserve normal anatomy, preserve normal anatomical and functional relationships and enhance both functionality and cosmetic results   Undermining: area extensively undermined   Undermining comment:  Undermining Defect 3.1cm Subcutaneous layers (deep stitches):  Suture type: Vicryl (polyglactin 910)   Subcutaneous suture technique: Inverted Dermal. Fine/surface layer approximation (top stitches):  Suture size:  3-0 Stitches: horizontal mattress   Suture removal (days):  7 Hemostasis achieved with: pressure Outcome: patient tolerated procedure well with no complications   Post-procedure details: sterile dressing applied and wound care instructions given   Dressing type: bandage, pressure dressing and bacitracin (Mupirocin)    Specimen 4 - Surgical pathology Differential Diagnosis: D48.5 Lipoma vs other Check Margins: No Rubbery nodule 3.1cm  Lipoma vs other x 4, excised today.  Start Doxycycline 100mg  po BID x 5 days, and Mupirocin 2% ointment to aa after dressing change.   Ordered Medications: mupirocin ointment (BACTROBAN) 2  %  Lipoma  Left hip Discussed excising in future  Return in about 1 week (around 09/11/2020) for suture removal.  I, Othelia Pulling, RMA, am acting as scribe for Sarina Ser, MD .  Documentation: I have reviewed the above documentation for accuracy and completeness, and I agree with the above.  Sarina Ser, MD

## 2020-09-04 NOTE — Telephone Encounter (Signed)
Pt doing fine after today's surgery.  Advised to call the office if any problems./sh

## 2020-09-05 ENCOUNTER — Institutional Professional Consult (permissible substitution): Payer: BC Managed Care – PPO | Admitting: Plastic Surgery

## 2020-09-06 ENCOUNTER — Encounter: Payer: Self-pay | Admitting: Dermatology

## 2020-09-11 ENCOUNTER — Other Ambulatory Visit: Payer: Self-pay

## 2020-09-11 ENCOUNTER — Ambulatory Visit (INDEPENDENT_AMBULATORY_CARE_PROVIDER_SITE_OTHER): Payer: BC Managed Care – PPO | Admitting: Dermatology

## 2020-09-11 DIAGNOSIS — L601 Onycholysis: Secondary | ICD-10-CM | POA: Diagnosis not present

## 2020-09-11 DIAGNOSIS — D1724 Benign lipomatous neoplasm of skin and subcutaneous tissue of left leg: Secondary | ICD-10-CM | POA: Diagnosis not present

## 2020-09-11 MED ORDER — TOBRAMYCIN 0.3 % OP SOLN: OPHTHALMIC | 1 refills | Status: DC

## 2020-09-11 NOTE — Patient Instructions (Signed)
Soak toenails in 1 parts bleach to 10 parts water for 10-15 minutes daily. Apply Tobramycin solution 1 drop to toenails every night. Start Biotin 2.5 mg daily. Toe nails may take up to one year to grow out.

## 2020-09-11 NOTE — Progress Notes (Deleted)
   Follow-Up Visit   Subjective  Andrea Bernard is a 54 y.o. female who presents for the following: Nail Problem (Patient had her toenails painted at a nail salon, and ever since her toenails have been discolored and irregular appearing. ).  The following portions of the chart were reviewed this encounter and updated as appropriate:   Tobacco  Allergies  Meds  Problems  Med Hx  Surg Hx  Fam Hx     Review of Systems:  No other skin or systemic complaints except as noted in HPI or Assessment and Plan.  Objective  Well appearing patient in no apparent distress; mood and affect are within normal limits.  A focused examination was performed including the legs and toenails. Relevant physical exam findings are noted in the Assessment and Plan.   Assessment & Plan  Onycholysis of toenail B/L great toenails With pseudomonas infection; no known hx of trauma to area other than having toenails done at a salon -   Start Tobramycin sol to aa's QHS. Avoid trauma to area. Advised patinet that nails will take many months to grow out (up to 12). Recommend 2.5mg  of Biotin QD. Start Bleach baths - 1 parts bleach to 10 parts water QD, soak for 15 minutes.   tobramycin (TOBREX) 0.3 % ophthalmic solution - B/L great toenails  Return for surgery - lipoma of the L hip, patient will call to schedule.  Luther Redo, CMA, am acting as scribe for Sarina Ser, MD .  Documentation: I have reviewed the above documentation for accuracy and completeness, and I agree with the above.  Sarina Ser, MD

## 2020-09-17 ENCOUNTER — Encounter: Payer: Self-pay | Admitting: Dermatology

## 2020-09-17 NOTE — Progress Notes (Signed)
   Follow-Up Visit   Subjective  Andrea Bernard is a 54 y.o. female who presents for the following: Nail Problem (Patient had her toenails painted at a nail salon, and ever since her toenails have been discolored and irregular appearing. ) and post op./suture removal (Patient is there today to have her sutures removed and to discuss pathology results).   The following portions of the chart were reviewed this encounter and updated as appropriate:   Tobacco  Allergies  Meds  Problems  Med Hx  Surg Hx  Fam Hx      Review of Systems:  No other skin or systemic complaints except as noted in HPI or Assessment and Plan.  Objective  Well appearing patient in no apparent distress; mood and affect are within normal limits.  A focused examination was performed including the lower extremities. Relevant physical exam findings are noted in the Assessment and Plan.  Objective  L ant distal thigh abov ethe knee sup med, L ant distal thigh above the knee inf med, L ant distal thigh above the knee middle, L ant distal thigh above the knee inf lat: Healing excision sites.   Assessment & Plan  Onycholysis of toenail B/L great toenails  With pseudomonas infection; no known hx of trauma to area other than having toenails done at a salon -   Start Tobramycin sol to aa's QHS. Avoid trauma to area. Advised patinet that nails will take many months to grow out (up to 12). Recommend 2.5mg  of Biotin QD. Start Bleach baths - 1 parts bleach to 10 parts water QD, soak for 15 minutes.   tobramycin (TOBREX) 0.3 % ophthalmic solution - B/L great toenails  Lipoma of left lower extremity L ant distal thigh abov ethe knee sup med, L ant distal thigh above the knee inf med, L ant distal thigh above the knee middle, L ant distal thigh above the knee inf lat  Encounter for Removal of Sutures - Incision site at the L ant distal thigh above the knee sup med, L ant distal thigh above the knee inf med, L ant distal  thigh above the knee middle, and L ant distal thigh above the knee inf lat is clean, dry and intact - Wound cleansed, sutures removed, wound cleansed and steri strips applied.  - Discussed pathology results showing benign lipomas.  - Patient advised to keep steri-strips dry until they fall off. - Scars remodel for a full year. - Once steri-strips fall off, patient can apply over-the-counter silicone scar cream each night to help with scar remodeling if desired. - Patient advised to call with any concerns or if they notice any new or changing lesions.   Plan excision of lipoma on the L hip in the future.   Return for surgery - lipoma of the L hip, patient will call to schedule.  Luther Redo, CMA, am acting as scribe for Sarina Ser, MD .  Documentation: I have reviewed the above documentation for accuracy and completeness, and I agree with the above.  Sarina Ser, MD

## 2020-10-03 ENCOUNTER — Encounter: Payer: Self-pay | Admitting: Hospice and Palliative Medicine

## 2020-10-03 ENCOUNTER — Ambulatory Visit (INDEPENDENT_AMBULATORY_CARE_PROVIDER_SITE_OTHER): Payer: BC Managed Care – PPO | Admitting: Nurse Practitioner

## 2020-10-03 ENCOUNTER — Other Ambulatory Visit: Payer: Self-pay

## 2020-10-03 VITALS — Resp 16 | Ht 64.0 in | Wt 160.0 lb

## 2020-10-03 DIAGNOSIS — J988 Other specified respiratory disorders: Secondary | ICD-10-CM

## 2020-10-03 DIAGNOSIS — B3731 Acute candidiasis of vulva and vagina: Secondary | ICD-10-CM

## 2020-10-03 DIAGNOSIS — R059 Cough, unspecified: Secondary | ICD-10-CM

## 2020-10-03 DIAGNOSIS — B373 Candidiasis of vulva and vagina: Secondary | ICD-10-CM

## 2020-10-03 MED ORDER — PREDNISONE 10 MG (21) PO TBPK
ORAL_TABLET | ORAL | 0 refills | Status: DC
Start: 2020-10-03 — End: 2020-11-01

## 2020-10-03 MED ORDER — AZITHROMYCIN 250 MG PO TABS: ORAL_TABLET | ORAL | 0 refills | Status: DC

## 2020-10-03 MED ORDER — FLUCONAZOLE 150 MG PO TABS: ORAL_TABLET | ORAL | 1 refills | Status: DC

## 2020-10-03 MED ORDER — ALBUTEROL SULFATE HFA 108 (90 BASE) MCG/ACT IN AERS
2.0000 | INHALATION_SPRAY | Freq: Four times a day (QID) | RESPIRATORY_TRACT | 2 refills | Status: DC | PRN
Start: 2020-10-03 — End: 2020-11-01

## 2020-10-03 NOTE — Progress Notes (Signed)
[facility] [place], [place] [address]  Internal MEDICINE  Telephone Visit  Patient Name: Andrea Bernard  [id number]  [id number]  Date of Service: 10/25/2020  I connected with the patient at 4:26pm by telephone and verified the patients identity using two identifiers.   I discussed the limitations, risks, security and privacy concerns of performing an evaluation and management service by telephone and the availability of in person appointments. I also discussed with the patient that there may be a patient responsible charge related to the service.  The patient expressed understanding and agrees to proceed.    Chief Complaint  Patient presents with  . Acute Visit  . Cough    Coughing up green/yellow mucus, got sick 09/06/20 coughing runny nose fever, was gone before Christmas, then the cough got bad, fever blisters on corners of mouth, not vaccinated   . Gastroesophageal Reflux  . Anemia  . Telephone Screen    [phone]  . Telephone Assessment    Phone call  . Quality Metric Gaps    pap    The patient has been contacted via telephone for follow up visit due to concerns for spread of novel coronavirus. The patient states that she started feeling poorly on 09/06/2020. Initially started with cold like symptoms. Then right before christmas, this developed into fever, headache, chest congestion, productive cough, and fatigue. She states that she just started taking mucinex today. She states that cough is productive of yellow phlegm. Nasal drainage is green in nature.       Current Medication: Outpatient Encounter Medications as of 10/03/2020  Medication Sig  . acetaminophen (TYLENOL) 325 MG tablet Take 650 mg by mouth.  [name] albuterol (VENTOLIN HFA) 108 (90 Base) MCG/ACT inhaler Inhale 2 puffs into the lungs every 6 (six) hours as needed for wheezing or shortness of breath.  [name] azithromycin (ZITHROMAX) 250 MG tablet Take 2 tablets po day 1.  Take 1 tablet po  days 2 through 10.  . cyclobenzaprine (FLEXERIL) 10 MG tablet Take 10 mg by mouth daily.  . fluconazole (DIFLUCAN) 150 MG tablet Take 1 tablet po once. May repeat dose in 3 days as needed for persistent symptoms.  [name] gabapentin (NEURONTIN) 300 MG capsule Take 300 mg by mouth 3 (three) times daily.  . OXYCONTIN 80 MG 12 hr tablet Take 80 mg by mouth every 12 (twelve) hours.  . predniSONE (STERAPRED UNI-PAK 21 TAB) 10 MG (21) TBPK tablet 6 day taper - take by mouth as directed for 6 days  . tobramycin (TOBREX) 0.3 % ophthalmic solution Apply 1 drop to toenails QHS.  . [DISCONTINUED] pantoprazole (PROTONIX) 40 MG tablet Take 1 tablet (40 mg total) by mouth 2 (two) times daily.  . [DISCONTINUED] SUMAtriptan (IMITREX) 100 MG tablet TAKE 1 TABLET (100 MG TOTAL) BY MOUTH DAILY AS NEEDED FOR MIGRAINE.  [name] amitriptyline (ELAVIL) 25 MG tablet Take by mouth.  . [DISCONTINUED] doxycycline (VIBRA-TABS) 100 MG tablet Take one tab po BID x 5 days. Take with food. (Patient not taking: Reported on 10/03/2020)  . [DISCONTINUED] famotidine (PEPCID) 20 MG tablet Take 1 tablet (20 mg total) by mouth daily. (Patient not taking: Reported on 10/03/2020)  . [DISCONTINUED] mupirocin ointment (BACTROBAN) 2 % Apply 1 application topically daily. Qd to excision site (Patient not taking: Reported on 10/03/2020)   Facility-Administered Encounter Medications as of 10/03/2020  Medication  . iron sucrose (VENOFER) 200 mg IVPB  . sodium chloride flush (NS) 0.9 % injection 10 mL  Surgical History: Past Surgical History:  Procedure Laterality Date  . carpel tunnel release    . CESAREAN SECTION     x 4  . COLONOSCOPY WITH PROPOFOL N/A 09/04/2017   Procedure: COLONOSCOPY WITH PROPOFOL;  Surgeon: Wyline MoodAnna, Kiran, MD;  Location: [facility] ENDOSCOPY;  Service: Gastroenterology;  Laterality: N/A;  . COLONOSCOPY WITH PROPOFOL N/A 12/21/2017   Procedure: COLONOSCOPY WITH PROPOFOL;  Surgeon: Wyline MoodAnna, Kiran, MD;  [facility] END[facility]COPY;   Service: Gastroenterology;  Laterality: N/A;  . ESOPHAGOGASTRODUODENOSCOPY (EGD) WITH PROPOFOL N/A 09/04/2017   Procedure: ESOPHAGOGASTRODUODENOSCOPY (EGD) WITH PROPOFOL;  Surgeon: Wyline MoodAnna, Kiran, MD;  [facility];  Service: Gastroenterology;  Laterality: N/A;  . HERNIA REPAIR    . TUBAL LIGATION    . TUMOR REMOVAL      Medical History: Past Medical History:  Diagnosis Date  . Arthritis   . Dysplastic nevus 03/02/2008   Right lateral thigh. Moderate atypia, limited margins free.  [name]. GERD (gastroesophageal reflux disease)   . Headache   . Iron deficiency anemia 06/15/2017  . Neurofibromatosis (HCC)     Family History: Family History  Problem Relation Age of Onset  . Anemia Mother   . Bladder Cancer Neg Hx   . Kidney cancer Neg Hx   . Breast cancer Neg Hx   . Ovarian cancer Neg Hx   . Colon cancer Neg Hx     Social History   Socioeconomic History  . Marital status: Married    Spouse name: Not on file  . Number of children: Not on file  . Years of education: Not on file  . Highest education level: Not on file  Occupational History  . Not on file  Tobacco Use  . Smoking status: Never Smoker  . Smokeless tobacco: Never Used  Vaping Use  . Vaping Use: Never used  Substance and Sexual Activity  . Alcohol use: No  . Drug use: Not Currently  . Sexual activity: Yes    Birth control/protection: Surgical  Other Topics Concern  . Not on file  Social History Narrative  . Not on file   Social Determinants of Health   Financial Resource Strain: Not on file  Food Insecurity: Not on file  Transportation Needs: Not on file  Physical Activity: Not on file  Stress: Not on file  Social Connections: Not on file  Intimate Partner Violence: Not on file      Review of Systems  Constitutional: Positive for activity change, chills, fatigue and fever. Negative for unexpected weight change.  HENT: Positive for congestion, ear pain, postnasal drip, rhinorrhea, sinus  pain, sore throat and voice change. Negative for sneezing.   Respiratory: Positive for cough and wheezing. Negative for chest tightness and shortness of breath.   Cardiovascular: Negative for chest pain and palpitations.  Gastrointestinal: Positive for nausea. Negative for abdominal pain, constipation, diarrhea and vomiting.  Musculoskeletal: Positive for arthralgias and myalgias. Negative for back pain, joint swelling and neck pain.  Skin: Negative for rash.  Allergic/Immunologic: Positive for environmental allergies.  Neurological: Positive for headaches. Negative for tremors and numbness.  Hematological: Positive for adenopathy. Does not bruise/bleed easily.  Psychiatric/Behavioral: Negative.  Negative for behavioral problems (Depression), sleep disturbance and suicidal ideas. The patient is not nervous/anxious.     Today's Vitals   10/03/20 1535  Resp: 16  Weight: 160 lb (72.6 kg)  Height: 5\' 4"  (1.626 m)   Body mass index is 27.46 kg/m.  Observation/Objective:   The patient is alert and oriented. She  is pleasant and answers all questions appropriately. Breathing is non-labored. She is in no acute distress at this time. The patient has moderate nasal congestion. She is hoarse. She has deep, congested sounding cough.    Assessment/Plan:  1. Respiratory infection Start z-pack. Take as directed for 5 days. Rest and increase fluids. Take OTC medications as needed and as indicated for acute symptoms. Add prednisone taper. Take as directed for 6 days.  - azithromycin (ZITHROMAX) 250 MG tablet; Take 2 tablets po day 1.  Take 1 tablet po days 2 through 10.  Dispense: 11 tablet; Refill: 0 - predniSONE (STERAPRED UNI-PAK 21 TAB) 10 MG (21) TBPK tablet; 6 day taper - take by mouth as directed for 6 days  Dispense: 21 tablet; Refill: 0  2. Vaginal yeast infection May take diflucan 150mg  if yeast infection develops. May repeat dose in three days for persistent symptoms.  - fluconazole  (DIFLUCAN) 150 MG tablet; Take 1 tablet po once. May repeat dose in 3 days as needed for persistent symptoms.  Dispense: 3 tablet; Refill: 1  3. Cough Add ventolin rescue inhaler. Use up to four times daily if needed for cough/wheezing.  - predniSONE (STERAPRED UNI-PAK 21 TAB) 10 MG (21) TBPK tablet; 6 day taper - take by mouth as directed for 6 days  Dispense: 21 tablet; Refill: 0 - albuterol (VENTOLIN HFA) 108 (90 Base) MCG/ACT inhaler; Inhale 2 puffs into the lungs every 6 (six) hours as needed for wheezing or shortness of breath.  Dispense: 1 each; Refill: 2   General Counseling: Aneesah verbalizes understanding of the findings of today's phone visit and agrees with plan of treatment. I have discussed any further diagnostic evaluation that may be needed or ordered today. We also reviewed her medications today. she has been encouraged to call the office with any questions or concerns that should arise related to todays visit.  Rest and increase fluids. Continue using OTC medication to control symptoms.   This patient was seen by Aram Beecham FNP Collaboration with Dr Vincent Gros as a part of collaborative care agreement  Meds ordered this encounter  Medications  . azithromycin (ZITHROMAX) 250 MG tablet    Sig: Take 2 tablets po day 1.  Take 1 tablet po days 2 through 10.    Dispense:  11 tablet    Refill:  0    Order Specific Question:   Supervising Provider    Answer:   Lyndon Code [[id number]]  . predniSONE (STERAPRED UNI-PAK 21 TAB) 10 MG (21) TBPK tablet    Sig: 6 day taper - take by mouth as directed for 6 days    Dispense:  21 tablet    Refill:  0    Order Specific Question:   Supervising Provider    Answer:   Lyndon Code [[id number]]  . albuterol (VENTOLIN HFA) 108 (90 Base) MCG/ACT inhaler    Sig: Inhale 2 puffs into the lungs every 6 (six) hours as needed for wheezing or shortness of breath.    Dispense:  1 each    Refill:  2    Order Specific Question:   Supervising Provider     Answer:   Lyndon Code [[id number]]  . fluconazole (DIFLUCAN) 150 MG tablet    Sig: Take 1 tablet po once. May repeat dose in 3 days as needed for persistent symptoms.    Dispense:  3 tablet    Refill:  1    Order Specific Question:   Supervising  Provider    Answer:   [name] [id number]    Time spent: 25 Minutes    Dr [name] Internal medicine

## 2020-10-12 DIAGNOSIS — Z719 Counseling, unspecified: Secondary | ICD-10-CM

## 2020-10-16 ENCOUNTER — Other Ambulatory Visit: Payer: Self-pay | Admitting: Adult Health

## 2020-10-16 ENCOUNTER — Telehealth: Payer: Self-pay

## 2020-10-16 DIAGNOSIS — G43909 Migraine, unspecified, not intractable, without status migrainosus: Secondary | ICD-10-CM

## 2020-10-16 DIAGNOSIS — K219 Gastro-esophageal reflux disease without esophagitis: Secondary | ICD-10-CM

## 2020-10-16 MED ORDER — PANTOPRAZOLE SODIUM 40 MG PO TBEC: 40.0000 mg | DELAYED_RELEASE_TABLET | Freq: Two times a day (BID) | ORAL | 5 refills | Status: DC

## 2020-10-16 NOTE — Telephone Encounter (Signed)
Patient has a surgery coming up and is wondering if she needs to have a breast lift included for everything to look good.  Please call.

## 2020-10-16 NOTE — Telephone Encounter (Signed)
This patient needs to come back to see Dr. Claudia Desanctis before we add anything extra to her surgery. Please schedule her for another follow up visit. Thank you!

## 2020-10-18 ENCOUNTER — Encounter: Payer: Self-pay | Admitting: Plastic Surgery

## 2020-10-18 ENCOUNTER — Other Ambulatory Visit: Payer: Self-pay

## 2020-10-18 ENCOUNTER — Ambulatory Visit (INDEPENDENT_AMBULATORY_CARE_PROVIDER_SITE_OTHER): Payer: BC Managed Care – PPO | Admitting: Plastic Surgery

## 2020-10-18 VITALS — BP 134/79 | HR 69 | Temp 98.2°F

## 2020-10-18 DIAGNOSIS — Z411 Encounter for cosmetic surgery: Secondary | ICD-10-CM

## 2020-10-18 NOTE — Progress Notes (Signed)
Referring Provider Andrea Guise, MD [address] [place],  [place] [address]   CC:  Chief Complaint  Patient presents with  . Follow-up      Andrea Bernard is an 55 y.o. female.  HPI: Patient presents to discuss breast enhancement at the time of her upcoming hernia repair and abdominoplasty.  She is interested in fuller more youthful appearing breast that are somewhat larger than what she currently has.  She has had no previous breast procedures or biopsies and no abnormal findings on mammograms.  She is up-to-date on her mammograms.  Allergies  Allergen Reactions  . Nitrofuran Derivatives Nausea And Vomiting    Outpatient Encounter Medications as of 10/18/2020  Medication Sig  . acetaminophen (TYLENOL) 325 MG tablet Take 650 mg by mouth.  [name] albuterol (VENTOLIN HFA) 108 (90 Base) MCG/ACT inhaler Inhale 2 puffs into the lungs every 6 (six) hours as needed for wheezing or shortness of breath.  [name] amitriptyline (ELAVIL) 25 MG tablet Take by mouth.  [name] azithromycin (ZITHROMAX) 250 MG tablet Take 2 tablets po day 1.  Take 1 tablet po days 2 through 10.  . cyclobenzaprine (FLEXERIL) 10 MG tablet Take 10 mg by mouth daily.  . fluconazole (DIFLUCAN) 150 MG tablet Take 1 tablet po once. May repeat dose in 3 days as needed for persistent symptoms.  [name] gabapentin (NEURONTIN) 300 MG capsule Take 300 mg by mouth 3 (three) times daily.  . OXYCONTIN 80 MG 12 hr tablet Take 80 mg by mouth every 12 (twelve) hours.  . pantoprazole (PROTONIX) 40 MG tablet Take 1 tablet (40 mg total) by mouth 2 (two) times daily.  . predniSONE (STERAPRED UNI-PAK 21 TAB) 10 MG (21) TBPK tablet 6 day taper - take by mouth as directed for 6 days  . SUMAtriptan (IMITREX) 100 MG tablet TAKE 1 TABLET (100 MG TOTAL) BY MOUTH DAILY AS NEEDED FOR MIGRAINE.  [name] tobramycin (TOBREX) 0.3 % ophthalmic solution Apply 1 drop to toenails QHS.   Facility-Administered Encounter Medications as of 10/18/2020  Medication  . iron sucrose  (VENOFER) 200 mg IVPB  . sodium chloride flush (NS) 0.9 % injection 10 mL     Past Medical History:  Diagnosis Date  . Arthritis   . Dysplastic nevus 03/02/2008   Right lateral thigh. Moderate atypia, limited margins free.  [name] GERD (gastroesophageal reflux disease)   . Headache   . Iron deficiency anemia 06/15/2017  . Neurofibromatosis [facility])     Past Surgical History:  Procedure Laterality Date  . carpel tunnel release    . CESAREAN SECTION     x 4  . COLONOSCOPY WITH PROPOFOL N/A 09/04/2017   Procedure: COLONOSCOPY WITH PROPOFOL;  Surgeon: [name], MD;  Location: [facility] ENDOSCOPY;  Service: Gastroenterology;  Laterality: N/A;  . COLONOSCOPY WITH PROPOFOL N/A 12/21/2017   Procedure: COLONOSCOPY WITH PROPOFOL;  Surgeon: [name], MD;  Location: [facility] ENDOSCOPY;  Service: Gastroenterology;  Laterality: N/A;  . ESOPHAGOGASTRODUODENOSCOPY (EGD) WITH PROPOFOL N/A 09/04/2017   Procedure: ESOPHAGOGASTRODUODENOSCOPY (EGD) WITH PROPOFOL;  Surgeon: [name], MD;  Location: [facility] ENDOSCOPY;  Service: Gastroenterology;  Laterality: N/A;  . HERNIA REPAIR    . TUBAL LIGATION    . TUMOR REMOVAL      Family History  Problem Relation Age of Onset  . Anemia Mother   . Bladder Cancer Neg Hx   . Kidney cancer Neg Hx   . Breast cancer Neg Hx   . Ovarian cancer Neg Hx   .  Colon cancer Neg Hx     Social History   Social History Narrative  . Not on file     Review of Systems General: Denies fevers, chills, weight loss CV: Denies chest pain, shortness of breath, palpitations  Physical Exam Vitals with BMI 10/18/2020 10/03/2020 08/23/2020  Height - 5\' 4"  5\' 4"   Weight - 160 lbs 169 lbs 3 oz  BMI - 73.53 29.92  Systolic 426 - 834  Diastolic 79 - 70  Pulse 69 - 57    General:  No acute distress,  Alert and oriented, Non-Toxic, Normal speech and affect Breast: She has grade 2 ptosis.  She looks to be around a small C cup.  She is symmetric in size between the right and left.  I do  not see any obvious scars or masses.  She has mild to moderate ptosis.  Pinch test in the upper pole was greater than 1 cm.  Base width is about 11.5 cm.  Assessment/Plan Patient presents with an interesting breast enhancement at the time of her abdominoplasty.  I think she is a reasonable candidate for augmentation mastopexy.  She is interested in around 250 cc of volume after trying the different sizes in the office.  I would expect with the size implant that she would still benefit from a mastopexy.  I explained that I would make an inframammary incision and placed the implant in a subglandular pocket.  We did discuss the pros and cons of the subpectoral subglandular pocket but given her greater than 1 cm pinch test I think subglandular is a reasonable choice for her that would give her a quicker recovery and a more natural appearance.  We discussed silicone versus saline implants and she prefers the more natural feel of the gel implants which is a reasonable choice for her.  I explained I would then tailor the skin and mastopexy to the appropriate amount of tension over the implant.  I explained that if I were to do a full mastopexy the scars would go around the nipple down to the crease and then along the crease along the bottom.  We discussed the risks of the procedure that include bleeding, infection, damage surrounding structures and need for additional procedures.  We discussed the potential for persistent contour irregularities and asymmetries and the potential for revision procedures down the line as the implants are not lifetime devices.  She seems to have done quite a bit of research and seems very well-informed.  She did request that her area will be smaller and I think if she does require a mastopexy it would be decreased in size a bit.  We did discuss that adding a breast procedure to her abdominoplasty would make her recovery little bit more difficult initially but I think it is reasonable to do  them at the same time.  All of her questions were answered and we will plan to proceed.  [name] 10/18/2020, 5:26 PM

## 2020-10-25 DIAGNOSIS — J988 Other specified respiratory disorders: Secondary | ICD-10-CM | POA: Insufficient documentation

## 2020-11-01 ENCOUNTER — Encounter: Payer: Self-pay | Admitting: Surgical

## 2020-11-01 ENCOUNTER — Other Ambulatory Visit: Payer: Self-pay

## 2020-11-01 ENCOUNTER — Ambulatory Visit (INDEPENDENT_AMBULATORY_CARE_PROVIDER_SITE_OTHER): Payer: Self-pay | Admitting: Surgical

## 2020-11-01 VITALS — BP 109/72 | HR 63 | Ht 64.0 in | Wt 169.0 lb

## 2020-11-01 DIAGNOSIS — Z719 Counseling, unspecified: Secondary | ICD-10-CM

## 2020-11-01 DIAGNOSIS — Z411 Encounter for cosmetic surgery: Secondary | ICD-10-CM

## 2020-11-01 DIAGNOSIS — M793 Panniculitis, unspecified: Secondary | ICD-10-CM

## 2020-11-01 MED ORDER — ONDANSETRON HCL 4 MG PO TABS: 4.0000 mg | ORAL_TABLET | Freq: Three times a day (TID) | ORAL | 0 refills | Status: DC | PRN

## 2020-11-01 NOTE — Progress Notes (Signed)
Patient ID: Andrea Bernard, female    DOB: 1965-10-18, 55 y.o.   MRN: [id number]  Chief Complaint  Patient presents with  . Pre-op Exam      ICD-10-CM   1. Encounter for cosmetic procedure  Z41.1   2. Panniculitis  M79.3      History of Present Illness: Andrea Bernard is a 55 y.o.  female  with a history of panniculitis and breast ptosis.  She presents for preoperative evaluation for upcoming procedure, abdominoplasty with liposuction and breast augmentation/mastopexy in conjunction with hernia by general surgery, scheduled for 11/16/2020 with Dr. Claudia Desanctis.  The patient has not had problems with anesthesia. No history of DVT/PE.  No family history of DVT/PE.  No family or personal history of bleeding or clotting disorders.  Patient is not currently taking any blood thinners.  No history of CVA/MI.   Summary of Previous Visit: Patient presents to discuss breast enhancement at the time of her upcoming hernia repair and abdominoplasty.  She has had no previous breast procedures or biopsies.  No abnormal finding on mammogram.  She is up-to-date on her mammograms.  Patient also presented to discuss panniculectomy in conjunction with ventral hernia repair.  She has a ventral hernia that is going to be repaired by her general surgeon.  She has had years of rashes beneath the crease of her abdominal fold.  She has had a history of several C-sections.  She does not smoke and is not a diabetic.  She is interested in around 250 cc of volume after trying different sizes in the office.  Job: Theme park manager  PMH Significant for: GERD, iron deficiency anemia,   Past Medical History: Allergies: Allergies  Allergen Reactions  . Nitrofuran Derivatives Nausea And Vomiting    Current Medications:  Current Outpatient Medications:  .  acetaminophen (TYLENOL) 325 MG tablet, Take 650 mg by mouth., Disp: , Rfl:  .  cyclobenzaprine (FLEXERIL) 10 MG tablet, Take 10 mg by mouth daily., Disp: , Rfl:  .   gabapentin (NEURONTIN) 300 MG capsule, Take 300 mg by mouth 3 (three) times daily., Disp: , Rfl: 0 .  OXYCONTIN 80 MG 12 hr tablet, Take 80 mg by mouth every 12 (twelve) hours., Disp: , Rfl: 0 .  pantoprazole (PROTONIX) 40 MG tablet, Take 1 tablet (40 mg total) by mouth 2 (two) times daily., Disp: 60 tablet, Rfl: 5 .  SUMAtriptan (IMITREX) 100 MG tablet, TAKE 1 TABLET (100 MG TOTAL) BY MOUTH DAILY AS NEEDED FOR MIGRAINE., Disp: 9 tablet, Rfl: 1 .  tobramycin (TOBREX) 0.3 % ophthalmic solution, Apply 1 drop to toenails QHS., Disp: 5 mL, Rfl: 1 No current facility-administered medications for this visit.  Facility-Administered Medications Ordered in Other Visits:  .  iron sucrose (VENOFER) 200 mg IVPB, 200 mg, Intravenous, Weekly, Earlie Server, MD .  sodium chloride flush (NS) 0.9 % injection 10 mL, 10 mL, Intracatheter, PRN, Earlie Server, MD  Past Medical Problems: Past Medical History:  Diagnosis Date  . Arthritis   . Dysplastic nevus 03/02/2008   Right lateral thigh. Moderate atypia, limited margins free.  [name] GERD (gastroesophageal reflux disease)   . Headache   . Iron deficiency anemia 06/15/2017  . Neurofibromatosis [facility])     Past Surgical History: Past Surgical History:  Procedure Laterality Date  . carpel tunnel release    . CESAREAN SECTION     x 4  . COLONOSCOPY WITH PROPOFOL N/A 09/04/2017   Procedure: COLONOSCOPY WITH PROPOFOL;  Surgeon:  [name], MD;  Location: [facility] ENDOSCOPY;  Service: Gastroenterology;  Laterality: N/A;  . COLONOSCOPY WITH PROPOFOL N/A 12/21/2017   Procedure: COLONOSCOPY WITH PROPOFOL;  Surgeon: [name], MD;  Location: [facility] ENDOSCOPY;  Service: Gastroenterology;  Laterality: N/A;  . ESOPHAGOGASTRODUODENOSCOPY (EGD) WITH PROPOFOL N/A 09/04/2017   Procedure: ESOPHAGOGASTRODUODENOSCOPY (EGD) WITH PROPOFOL;  Surgeon: [name], MD;  Location: [facility] ENDOSCOPY;  Service: Gastroenterology;  Laterality: N/A;  . HERNIA REPAIR    . TUBAL LIGATION    . TUMOR  REMOVAL      Social History: Social History   Socioeconomic History  . Marital status: Married    Spouse name: Not on file  . Number of children: Not on file  . Years of education: Not on file  . Highest education level: Not on file  Occupational History  . Not on file  Tobacco Use  . Smoking status: Never Smoker  . Smokeless tobacco: Never Used  Vaping Use  . Vaping Use: Never used  Substance and Sexual Activity  . Alcohol use: No  . Drug use: Not Currently  . Sexual activity: Yes    Birth control/protection: Surgical  Other Topics Concern  . Not on file  Social History Narrative  . Not on file   Social Determinants of Health   Financial Resource Strain: Not on file  Food Insecurity: Not on file  Transportation Needs: Not on file  Physical Activity: Not on file  Stress: Not on file  Social Connections: Not on file  Intimate Partner Violence: Not on file    Family History: Family History  Problem Relation Age of Onset  . Anemia Mother   . Bladder Cancer Neg Hx   . Kidney cancer Neg Hx   . Breast cancer Neg Hx   . Ovarian cancer Neg Hx   . Colon cancer Neg Hx     Review of Systems: Review of Systems  Constitutional: Negative.   Respiratory: Negative.   Cardiovascular: Negative.   Gastrointestinal: Negative.   Genitourinary: Negative.   Musculoskeletal: Negative.   Neurological: Negative.     Physical Exam: Vital Signs BP 109/72 (BP Location: Left Arm, Patient Position: Sitting, Cuff Size: Normal)   Pulse 63   Ht 5\' 4"  (1.626 m)   Wt 169 lb (76.7 kg)   LMP 12/18/2017   SpO2 95%   BMI 29.01 kg/m  Physical Exam Constitutional:      General: She is not in acute distress.    Appearance: Normal appearance. She is not ill-appearing.  HENT:     Head: Normocephalic and atraumatic.  Eyes:     Pupils: Pupils are equal, round Neck:     Musculoskeletal: Normal range of motion.  Cardiovascular:     Rate and Rhythm: Normal rate and regular rhythm.      Pulses: Normal pulses.     Heart sounds: Normal heart sounds. No murmur.  Pulmonary:     Effort: Pulmonary effort is normal. No respiratory distress.     Breath sounds: Normal breath sounds. No wheezing.  Abdominal:     General: Abdomen is flat. There is no distension.     Palpations: Abdomen is soft.     Tenderness: She has some midline abdominal tenderness overlying the hernia. Musculoskeletal: Normal range of motion.  Skin:    General: Skin is warm and dry.     Findings: No erythema or rash.  Neurological:     General: No focal deficit present.     Mental Status: She is  alert and oriented to person, place, and time. Mental status is at baseline.     Motor: No weakness.  Psychiatric:        Mood and Affect: Mood normal.        Behavior: Behavior normal.    Assessment/Plan: The patient is scheduled for abdominoplasty with liposuction in conjunction with hernia repair by general surgery, breast augmentation/mastopexy with Dr. Claudia Desanctis.  Risks, benefits, and alternatives of procedure discussed, questions answered and consent obtained.    Smoking Status: Non-smoker; Counseling Given?  N/A Last Mammogram: September 2021; Results: Negative, no evidence of malignancy  Caprini Score: 4, moderate; Risk Factors include: Age, BMI > 25, and length of planned surgery. Recommendation for mechanical and pharmacological prophylaxis while hospitalized. Encourage early ambulation.   Pictures were obtained of the patient and placed in the chart with the patient's or guardian's permission.  Chaperone was present during breast exam.  Post-op Rx sent to pharmacy: Zofran.  Patient is on chronic pain management regimen which she has been on for over approximately 20 years.  She reports that she will reach out to her pain management provider to work with them on treating postoperative pain.  She reports that she has done this in the past and this is what she has most comfortable with as she does not want to  cause any issues with her pain meds.  We did send a clearance to her pain management provider to confirm that they are okay treating her postop pain.  Patient was provided with the General Surgical Risk consent document and Pain Medication Agreement prior to their appointment.  They had adequate time to read through the risk consent documents and Pain Medication Agreement. We also discussed them in person together during this preop appointment. All of their questions were answered to their satisfaction.  Recommended calling if they have any further questions.  Risk consent form and Pain Medication Agreement to be scanned into patient's chart.  The risk that can be encountered for this procedure were discussed and include the following but not limited to these: asymmetry, fluid accumulation, firmness of the tissue, skin loss, decrease or no sensation, fat necrosis, bleeding, infection, healing delay.  Deep vein thrombosis, cardiac and pulmonary complications are risks to any procedure.  There are risks of anesthesia, changes to skin sensation and injury to nerves or blood vessels.  The muscle can be temporarily or permanently injured.  You may have an allergic reaction to tape, suture, glue, blood products which can result in skin discoloration, swelling, pain, skin lesions, poor healing.  Any of these can lead to the need for revisonal surgery or stage procedures.  Weight gain and weigh loss can also effect the long term appearance. The results are not guaranteed to last a lifetime.  Future surgery may be required.    The risk that can be encountered with breast augmentation or maastopexy were discussed and include the following but not limited to these:  Breast asymmetry, fluid accumulation, firmness of the breast, inability to breast feed, loss of nipple or areola, skin loss, decrease or no nipple sensation, fat necrosis of the breast tissue, bleeding, infection, healing delay.  Deep vein thrombosis, cardiac  and pulmonary complications are risks to any procedure. The implant can have a faulty position or one different from what you had desired.  The implant can have rippling, wrinkling, leakage or rupture. There are risks of anesthesia, changes to skin sensation and injury to nerves or blood vessels.  The muscle  can be temporarily or permanently injured.  You may have an allergic reaction to tape, suture, glue, blood products which can result in skin discoloration, swelling, pain, skin lesions, poor healing.  Any of these can lead to the need for revisonal surgery or stage procedures.  A reduction has potential to interfere with diagnostic procedures.  Nipple or breast piercing can increase risks of infection.    This procedure is best done when the breast is fully developed.  Changes in the breast will continue to occur over time.  Pregnancy can alter the outcomes of previous breast reduction surgery, weight gain and weigh loss can also effect the long term appearance. Implants are not guaranteed to last a lifetime.  Future surgery may be required.  Regular examinations of the breast are required to evaluate the condition of your breasts and implants.  The risks that can be encountered with and after liposuction were discussed and include the following but no limited to these:  Asymmetry, fluid accumulation, firmness of the area, fat necrosis with death of fat tissue, bleeding, infection, delayed healing, anesthesia risks, skin sensation changes, injury to structures including nerves, blood vessels, and muscles which may be temporary or permanent, allergies to tape, suture materials and glues, blood products, topical preparations or injected agents, skin and contour irregularities, skin discoloration and swelling, deep vein thrombosis, cardiac and pulmonary complications, pain, which may persist, persistent pain, recurrence of the lesion, poor healing of the incision, possible need for revisional surgery or staged  procedures. Thiere can also be persistent swelling, poor wound healing, rippling or loose skin, worsening of cellulite, swelling, and thermal burn or heat injury from ultrasound with the ultrasound-assisted lipoplasty technique. Any change in weight fluctuations can alter the outcome.   Electronically signed by: Carola Rhine Duke Weisensel, PA-C 11/01/2020 10:09 AM

## 2020-11-01 NOTE — Progress Notes (Signed)
Received surgical clearance form back from patient's pain management provider Dr. Shanon Ace.  In the surgical clearance request pain management provider recommended patient take her pain medication day of surgery.  He also states that he will provide postop pain medications for her.  Appreciate their assistance.  Patient is aware.

## 2020-11-08 ENCOUNTER — Ambulatory Visit (INDEPENDENT_AMBULATORY_CARE_PROVIDER_SITE_OTHER): Payer: BC Managed Care – PPO | Admitting: Internal Medicine

## 2020-11-08 ENCOUNTER — Encounter: Payer: Self-pay | Admitting: Hospice and Palliative Medicine

## 2020-11-08 VITALS — Temp 97.8°F | Ht 64.0 in | Wt 160.0 lb

## 2020-11-08 DIAGNOSIS — J0100 Acute maxillary sinusitis, unspecified: Secondary | ICD-10-CM

## 2020-11-08 DIAGNOSIS — J45909 Unspecified asthma, uncomplicated: Secondary | ICD-10-CM | POA: Diagnosis not present

## 2020-11-08 MED ORDER — AMOXICILLIN-POT CLAVULANATE 875-125 MG PO TABS
1.0000 | ORAL_TABLET | Freq: Two times a day (BID) | ORAL | 0 refills | Status: DC
Start: 2020-11-08 — End: 2021-02-11

## 2020-11-08 MED ORDER — ALBUTEROL SULFATE HFA 108 (90 BASE) MCG/ACT IN AERS: 2.0000 | INHALATION_SPRAY | Freq: Four times a day (QID) | RESPIRATORY_TRACT | 0 refills | Status: DC | PRN

## 2020-11-08 NOTE — Progress Notes (Signed)
[facility] [place], [place] [address]  Internal MEDICINE  Telephone Visit  Patient Name: Andrea Bernard  [id number]  [id number]  Date of Service: 11/08/2020  I connected with the patient at 355 PMby telephone and verified the patients identity using two identifiers.   I discussed the limitations, risks, security and privacy concerns of performing an evaluation and management service by telephone and the availability of in person appointments. I also discussed with the patient that there may be a patient responsible charge related to the service.  The patient expressed understanding and agrees to proceed.    Chief Complaint  Patient presents with  . Telephone Assessment    [id number]  . Telephone Screen  . Sinusitis  . Nasal Congestion    HPI Pt is connected for acute and sick visit. She has not recovered from her sinus infection/bronchitis since end of December, she did take azithromycin at the time, denies ant COVID exposure. She has chest tightness and congestion, denies any smoking. Has not has covid vaccine  Denies any fever or chills  Current Medication: Outpatient Encounter Medications as of 11/08/2020  Medication Sig  . albuterol (VENTOLIN HFA) 108 (90 Base) MCG/ACT inhaler Inhale 2 puffs into the lungs every 6 (six) hours as needed for wheezing or shortness of breath.  [name] amoxicillin-clavulanate (AUGMENTIN) 875-125 MG tablet Take 1 tablet by mouth 2 (two) times daily.  [name] acetaminophen (TYLENOL) 325 MG tablet Take 650 mg by mouth.  . cyclobenzaprine (FLEXERIL) 10 MG tablet Take 10 mg by mouth daily.  [name] gabapentin (NEURONTIN) 300 MG capsule Take 300 mg by mouth 3 (three) times daily.  . ondansetron (ZOFRAN) 4 MG tablet Take 1 tablet (4 mg total) by mouth every 8 (eight) hours as needed for nausea or vomiting.  . OXYCONTIN 80 MG 12 hr tablet Take 80 mg by mouth every 12 (twelve) hours.  . pantoprazole (PROTONIX) 40 MG tablet Take 1 tablet (40 mg  total) by mouth 2 (two) times daily.  . SUMAtriptan (IMITREX) 100 MG tablet TAKE 1 TABLET (100 MG TOTAL) BY MOUTH DAILY AS NEEDED FOR MIGRAINE.  [name] tobramycin (TOBREX) 0.3 % ophthalmic solution Apply 1 drop to toenails QHS.   Facility-Administered Encounter Medications as of 11/08/2020  Medication  . iron sucrose (VENOFER) 200 mg IVPB  . sodium chloride flush (NS) 0.9 % injection 10 mL    Surgical History: Past Surgical History:  Procedure Laterality Date  . carpel tunnel release    . CESAREAN SECTION     x 4  . COLONOSCOPY WITH PROPOFOL N/A 09/04/2017   Procedure: COLONOSCOPY WITH PROPOFOL;  Surgeon: [name], MD;  Location: [facility] ENDOSCOPY;  Service: Gastroenterology;  Laterality: N/A;  . COLONOSCOPY WITH PROPOFOL N/A 12/21/2017   Procedure: COLONOSCOPY WITH PROPOFOL;  Surgeon: [name], MD;  Location: [facility] ENDOSCOPY;  Service: Gastroenterology;  Laterality: N/A;  . ESOPHAGOGASTRODUODENOSCOPY (EGD) WITH PROPOFOL N/A 09/04/2017   Procedure: ESOPHAGOGASTRODUODENOSCOPY (EGD) WITH PROPOFOL;  Surgeon: [name], MD;  Location: [facility] ENDOSCOPY;  Service: Gastroenterology;  Laterality: N/A;  . HERNIA REPAIR    . TUBAL LIGATION    . TUMOR REMOVAL      Medical History: Past Medical History:  Diagnosis Date  . Arthritis   . Dysplastic nevus 03/02/2008   Right lateral thigh. Moderate atypia, limited margins free.  [name] GERD (gastroesophageal reflux disease)   . Headache   . Iron deficiency anemia 06/15/2017  . Neurofibromatosis [facility])     Family History: Family History  Problem Relation Age of Onset  . Anemia Mother   . Bladder Cancer Neg Hx   . Kidney cancer Neg Hx   . Breast cancer Neg Hx   . Ovarian cancer Neg Hx   . Colon cancer Neg Hx     Social History   Socioeconomic History  . Marital status: Married    Spouse name: Not on file  . Number of children: Not on file  . Years of education: Not on file  . Highest education level: Not on file  Occupational History  .  Not on file  Tobacco Use  . Smoking status: Never Smoker  . Smokeless tobacco: Never Used  Vaping Use  . Vaping Use: Never used  Substance and Sexual Activity  . Alcohol use: No  . Drug use: Not Currently  . Sexual activity: Yes    Birth control/protection: Surgical  Other Topics Concern  . Not on file  Social History Narrative  . Not on file   Social Determinants of Health   Financial Resource Strain: Not on file  Food Insecurity: Not on file  Transportation Needs: Not on file  Physical Activity: Not on file  Stress: Not on file  Social Connections: Not on file  Intimate Partner Violence: Not on file      Review of Systems  Constitutional: Positive for fatigue. Negative for chills.  HENT: Positive for congestion, sinus pain and voice change.   Respiratory: Positive for cough and shortness of breath.   Cardiovascular: Positive for leg swelling.  Neurological: Negative.   Psychiatric/Behavioral: Negative.     Vital Signs: Temp 97.8 F (36.6 C)   Ht 5\' 4"  (1.626 m)   Wt 160 lb (72.6 kg)   LMP 12/18/2017   BMI 27.46 kg/m    Observation/Objective: Nasal congestion with bouts of cough during virtual interaction    Assessment/Plan: 1. Acute asthmatic bronchitis Will start therapy as follows, if no improvement seen will need to be seen in person, might need CXR or other diagnostics  - albuterol (VENTOLIN HFA) 108 (90 Base) MCG/ACT inhaler; Inhale 2 puffs into the lungs every 6 (six) hours as needed for wheezing or shortness of breath.  Dispense: 8 g; Refill: 0  2. Acute non-recurrent maxillary sinusitis Start amoxicillin-clavulanate (AUGMENTIN) 875-125 MG tablet; Take 1 tablet by mouth 2 (two) times daily.  Dispense: 20 tablet; Refill: 0, monitor symptoms   General Counseling: Andrea Bernard verbalizes understanding of the findings of today's phone visit and agrees with plan of treatment. I have discussed any further diagnostic evaluation that may be needed or ordered  today. We also reviewed her medications today. she has been encouraged to call the office with any questions or concerns that should arise related to todays visit.   Meds ordered this encounter  Medications  . amoxicillin-clavulanate (AUGMENTIN) 875-125 MG tablet    Sig: Take 1 tablet by mouth 2 (two) times daily.    Dispense:  20 tablet    Refill:  0  . albuterol (VENTOLIN HFA) 108 (90 Base) MCG/ACT inhaler    Sig: Inhale 2 puffs into the lungs every 6 (six) hours as needed for wheezing or shortness of breath.    Dispense:  8 g    Refill:  0    Time spent:15 Minutes    Dr [name] Internal medicine

## 2020-11-12 ENCOUNTER — Telehealth: Payer: Self-pay | Admitting: *Deleted

## 2020-11-12 NOTE — Telephone Encounter (Signed)
"  Could you please call me back.  This is time sensitive as I'm having surgery next week."

## 2020-11-13 ENCOUNTER — Other Ambulatory Visit: Payer: Self-pay

## 2020-11-13 NOTE — Telephone Encounter (Signed)
I attempted to return her call.  I left her a message to call me back on tomorrow.

## 2020-11-14 ENCOUNTER — Other Ambulatory Visit: Payer: Self-pay

## 2020-11-14 DIAGNOSIS — K219 Gastro-esophageal reflux disease without esophagitis: Secondary | ICD-10-CM

## 2020-11-14 MED ORDER — PANTOPRAZOLE SODIUM 40 MG PO TBEC: 40.0000 mg | DELAYED_RELEASE_TABLET | Freq: Two times a day (BID) | ORAL | 5 refills | Status: DC

## 2020-11-15 NOTE — Telephone Encounter (Signed)
I attempted to return her call again.  I left her a message to call me back.

## 2020-11-22 ENCOUNTER — Encounter: Payer: BC Managed Care – PPO | Admitting: Plastic Surgery

## 2020-11-27 DIAGNOSIS — Z719 Counseling, unspecified: Secondary | ICD-10-CM

## 2020-11-29 ENCOUNTER — Encounter: Payer: BC Managed Care – PPO | Admitting: Surgical

## 2020-12-04 HISTORY — PX: BREAST ENHANCEMENT SURGERY: SHX7

## 2020-12-04 HISTORY — PX: OTHER SURGICAL HISTORY: SHX169

## 2020-12-04 HISTORY — PX: HERNIA REPAIR: SHX51

## 2020-12-10 HISTORY — PX: AUGMENTATION MAMMAPLASTY: SUR837

## 2020-12-12 ENCOUNTER — Other Ambulatory Visit: Payer: Self-pay | Admitting: Internal Medicine

## 2020-12-12 DIAGNOSIS — G43909 Migraine, unspecified, not intractable, without status migrainosus: Secondary | ICD-10-CM

## 2020-12-13 ENCOUNTER — Other Ambulatory Visit: Payer: Self-pay | Admitting: Dermatology

## 2020-12-13 ENCOUNTER — Ambulatory Visit (INDEPENDENT_AMBULATORY_CARE_PROVIDER_SITE_OTHER): Payer: Self-pay

## 2020-12-13 ENCOUNTER — Telehealth: Payer: Self-pay

## 2020-12-13 ENCOUNTER — Other Ambulatory Visit: Payer: Self-pay | Admitting: Podiatry

## 2020-12-13 ENCOUNTER — Other Ambulatory Visit: Payer: Self-pay | Admitting: Internal Medicine

## 2020-12-13 ENCOUNTER — Other Ambulatory Visit: Payer: Self-pay

## 2020-12-13 VITALS — BP 127/80 | HR 63 | Temp 98.2°F | Ht 64.0 in | Wt 160.0 lb

## 2020-12-13 DIAGNOSIS — J45909 Unspecified asthma, uncomplicated: Secondary | ICD-10-CM

## 2020-12-13 DIAGNOSIS — M793 Panniculitis, unspecified: Secondary | ICD-10-CM

## 2020-12-13 DIAGNOSIS — L601 Onycholysis: Secondary | ICD-10-CM

## 2020-12-13 NOTE — Telephone Encounter (Signed)
Patient called to say that she is dizzy when she stands up and would like to know if this is normal.  She would like to know if there is anything she can do about it.  Patient would like to have clarification on when she can loosen her bandages and take a shower.  Please call.  *Patient said that cloudy weather sometimes causes reception issues for her phone.  She said that she is right there by her phone, so please call back if she doesn't pick up.

## 2020-12-13 NOTE — Telephone Encounter (Signed)
error 

## 2020-12-13 NOTE — Progress Notes (Signed)
Pt in office after calling with c/o " numbness left upper/lower lip" & dizziness. Her vital signs are WNL. Pain 6/10 mid abdomen &  4/10 pain bilateral breasts.  Pain is relieved with OTC ibuprofen/tylenol rotation -between pain RX doses. She is eating/drinking - normal bladder function- no BM to date- I instructed her to use OTC fiber supplement as needed. No nausea/vomiting & no chills/fever. No asymmetry to her face/mouth, no difficulty with speech/swallowing. No noted drift in arms/hands & gait is normal. She did remove the Scopolamine patch from behind her left ear yesterday as directed by anesthesia.  She denies touching her mouth after removing the patch, but I explained how she could have inadvertently transferred the med from the patch to her mouth. We also discussed the possibility of a reaction to the airway/intubation tubing. I removed/replaced her dressings & incisions are intact, there is some bruising on abdomen & breast laterally. minimal drainage noted on dressings. Drains remain intact left/right abdomen. Drainage = 40/right & 30/left We discussed the dizziness- could be d/t mild dehydration following surgery - I instructed her to increase fluids- may try Pedialyte supplements/drinks to restore electrolytes. She has no rash/red itch or hives. She & her husband understand that if the numbness continues or gets worse or she experiences any complications that warrant more immediate care she is directed to go to the ED. I consulted & informed Dr. Claudia Desanctis with the above report & he agrees with the plan. She has a f/u visit with Dr. Claudia Desanctis wed 12/19/20

## 2020-12-13 NOTE — Telephone Encounter (Addendum)
Returned patients call. LMVM, make sure she is drinking plenty of fluids and eating. Surgery was on 11/16/20 for abdominoplasty, lipo, breast augmentation/mastopexy and hernia repair (by another physician). She should not be feeling dizzy this far out. Can take a shower 3 days after from surgery. Let soap run down from top of shoulder run down her body. Replace with clean dressings and keep binder or spanx on 24/7 otherwise. Call us back if she has nausea, vomiting, fever or diarrhea.

## 2020-12-13 NOTE — Telephone Encounter (Signed)
Please advise 

## 2020-12-13 NOTE — Telephone Encounter (Signed)
Pt need appt for refills  ?

## 2020-12-13 NOTE — Patient Instructions (Addendum)
Pt is reminded to call for any questions or concerns. She will remove dressings/ace wraps & binder to shower on Sat.- then she can wear sports bra & reapply the abdominal binder. F/u with Dr. Claudia Desanctis next week. She will call for any concerns

## 2020-12-13 NOTE — Telephone Encounter (Addendum)
Patient indicated her left side of top and bottom lips are numb. Spoke with Dr. Claudia Desanctis and there is nothing to do with the surgery causing this sympton. Asked her if she could have someone drive her to the office today by 3:00 for [place] to check her BP  and oxygen. Patient is having husband drive her to the office today.

## 2020-12-19 ENCOUNTER — Ambulatory Visit (INDEPENDENT_AMBULATORY_CARE_PROVIDER_SITE_OTHER): Payer: Self-pay | Admitting: Plastic Surgery

## 2020-12-19 ENCOUNTER — Other Ambulatory Visit: Payer: Self-pay

## 2020-12-19 DIAGNOSIS — Z411 Encounter for cosmetic surgery: Secondary | ICD-10-CM

## 2020-12-19 NOTE — Progress Notes (Signed)
Patient is here postop from abdominoplasty with bilateral breast augmentation and a donut mastopexy.  She feels good.  On exam everything looks to be doing well.  All of her incisions are intact and she has great shape size and symmetry.  She has had a reaction to the Steri-Strips and those of all been removed.  Other than that everything looks great.  The left-sided drain was removed today as had it as it has not been putting out very much.  We will take the right side out next week more than likely.  I encouraged her to avoid strenuous activity and continue to wear compressive garments which she has with her today.  All of her questions were answered.

## 2020-12-21 ENCOUNTER — Telehealth: Payer: Self-pay

## 2020-12-21 NOTE — Telephone Encounter (Signed)
Patient called to state that she is running a low grade fever and thinks she may have a UTI. She would like to know if an antibiotic can be called in.

## 2020-12-26 ENCOUNTER — Other Ambulatory Visit: Payer: Self-pay

## 2020-12-26 ENCOUNTER — Ambulatory Visit (INDEPENDENT_AMBULATORY_CARE_PROVIDER_SITE_OTHER): Payer: Self-pay | Admitting: Surgical

## 2020-12-26 ENCOUNTER — Encounter: Payer: Self-pay | Admitting: Surgical

## 2020-12-26 VITALS — BP 128/82 | HR 70

## 2020-12-26 DIAGNOSIS — Z411 Encounter for cosmetic surgery: Secondary | ICD-10-CM

## 2020-12-26 DIAGNOSIS — M793 Panniculitis, unspecified: Secondary | ICD-10-CM

## 2020-12-26 NOTE — Progress Notes (Signed)
Patient is a 55 year old female here for follow-up after abdominoplasty with liposuction and breast augmentation/mastopexy in conjunction with hernia repair by general surgery on 11/16/2020.  Patient reports she is doing really well, she is very pleased with how everything is healing.  She does report that she feels as if she was allergic to something, she reports irritation surrounding her bilateral nipple/areola.  She reports burning sensation with application of Vaseline and gauze.  She reports that she removed the Steri-Strips from her abdomen as this was also causing her some irritation.  She is otherwise doing really well and is very pleased with how things are going.  Chaperone present on exam On exam bilateral breast incisions are intact, bilateral NAC's are viable with good color.  She does have irritation surrounding bilateral NAC's with a maculopapular rash.  There is no cellulitic changes.  No drainage noted.  Bilateral inframammary fold incisions are intact.  Abdomen incisions are intact, healing nicely.  She does have darkened epithelium surrounding the entire incision where Steri-Strips were in place.  There is no erythema.  Umbilicus incision intact, no necrosis is noted.  No fluid collections noted with palpation.  Right JP drain in place with 2 cc of serosanguineous drainage in bulb.  Right JP drain was removed due to low output Recommend continue her compressive garment 24/7 over breast and abdomen.  Recommend avoiding strenuous activity. Recommend following up in 3 weeks for reevaluation.  Call with any questions or concerns.

## 2020-12-27 ENCOUNTER — Telehealth: Payer: Self-pay

## 2020-12-27 NOTE — Telephone Encounter (Signed)
12/21/20- call returned to pt regarding her request for "antibiotic" for what she thinks is a "UTI"- symptoms were- low grade temp/burning with urination. I consulted with Dr. Claudia Desanctis & he suggested pt call/visit her PCP- who has treated her for this condition in the past & I also informed her that Dr. Claudia Desanctis said that she would most likely need a urinalysis/study to determine plan of care for a possible UTI. She will call her PCP for assistance.

## 2021-01-07 ENCOUNTER — Telehealth: Payer: Self-pay

## 2021-01-07 NOTE — Telephone Encounter (Signed)
Patient called to say that her breasts are hurting really bad and she thinks she needs to be seen by [place].  Please call.

## 2021-01-07 NOTE — Telephone Encounter (Signed)
Returned patients call. She indicated both breast are tender to touch/painful in the same areas (areolas, nipples, and inner breast). The pain started last week and got worse by Friday. 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 Denies fever, chills, nausea, vomiting, swelling, redness and hardening of the breast. Advised her to rest more, physically slow down to avoid future injuries or damages from surgery. Alternate Tylenol (do not take nor more than 3000 within 24 hour period) and Ibuprofen, may use a cold compress to alleviate some pain. Patient understood and agreed with plan. Reminded her of the next follow up appointment is with Dr. Claudia Desanctis on 01/06/2021 at 3:00pm. She will need to change date due to confliction with her plans that day.  Will let the front desk know to call patient and reschedule.

## 2021-01-09 ENCOUNTER — Other Ambulatory Visit: Payer: Self-pay

## 2021-01-09 ENCOUNTER — Encounter: Payer: Self-pay | Admitting: Plastic Surgery

## 2021-01-09 ENCOUNTER — Ambulatory Visit (INDEPENDENT_AMBULATORY_CARE_PROVIDER_SITE_OTHER): Payer: Self-pay | Admitting: Plastic Surgery

## 2021-01-09 VITALS — BP 113/82 | HR 66

## 2021-01-09 DIAGNOSIS — Z411 Encounter for cosmetic surgery: Secondary | ICD-10-CM

## 2021-01-09 NOTE — Progress Notes (Signed)
Patient presents now 1 month postop from abdominoplasty and augmentation mastopexy.  She overall feels good and is happy with her result.  She is complaining of some pain along the inframammary fold on both breasts.  She also has an area of tenderness laterally on the right side of her abdominoplasty incision.  Otherwise she is coming along fine.  On exam all of her incisions look good.  There are a few spitting staples in sutures along the abdominoplasty incision these were all removed.  Some residual sutures were removed from the umbilicus as well.  The corner of her abdominoplasty incision on the right side is tender but I do not see any redness and certainly no fluctuance in that area.  The result of her abdomen and her breast is quite good with good shape and contour.  We will plan to adjust the way that she has been wrapping herself at least at nighttime and see if that has any impact on the pain at this point.  I will plan to see her again in about a month but she knows to call us with any changes.

## 2021-01-14 ENCOUNTER — Telehealth: Payer: Self-pay

## 2021-01-14 ENCOUNTER — Other Ambulatory Visit: Payer: Self-pay | Admitting: Physician Assistant

## 2021-01-14 DIAGNOSIS — R5383 Other fatigue: Secondary | ICD-10-CM

## 2021-01-14 NOTE — Telephone Encounter (Signed)
Patient called to say that she had a really bad weekend, her breasts hurt so bad.  Patient said that her breasts are painful to the touch and she can't put a seat belt across her chest.  Patient said that she was up all night and she is really struggling.  Please call.

## 2021-01-14 NOTE — Telephone Encounter (Signed)
Pt called [name] that she would like to have her labs done before her physical on 01/28/2021

## 2021-01-14 NOTE — Telephone Encounter (Signed)
Returned patients call. She is still complaining of extreme BL breast  pain, tender to touch, swelling along with a low grade fever of 99. Some chills, no nausea, or vomiting. Alternating tylenol and Ibuprofen with no relief. Advised to use a could compress so see if that would help. Will have front desk to call her to schedule an appointment with Venita Lick, or Dr. Claudia Desanctis.  Patient understood and agreed.

## 2021-01-15 ENCOUNTER — Other Ambulatory Visit: Payer: Self-pay

## 2021-01-15 ENCOUNTER — Telehealth: Payer: Self-pay

## 2021-01-15 DIAGNOSIS — D509 Iron deficiency anemia, unspecified: Secondary | ICD-10-CM

## 2021-01-15 NOTE — Telephone Encounter (Signed)
Called pt and informed her that labs are in system for her to go have done before her physical

## 2021-01-16 ENCOUNTER — Ambulatory Visit: Payer: Self-pay | Admitting: Plastic Surgery

## 2021-01-17 ENCOUNTER — Other Ambulatory Visit: Payer: Self-pay

## 2021-01-17 ENCOUNTER — Ambulatory Visit (INDEPENDENT_AMBULATORY_CARE_PROVIDER_SITE_OTHER): Payer: BC Managed Care – PPO

## 2021-01-17 VITALS — BP 120/79 | HR 71 | Temp 97.4°F | Ht 64.0 in | Wt 153.0 lb

## 2021-01-17 DIAGNOSIS — Z411 Encounter for cosmetic surgery: Secondary | ICD-10-CM

## 2021-01-18 MED ORDER — CELECOXIB 200 MG PO CAPS: 200.0000 mg | ORAL_CAPSULE | Freq: Two times a day (BID) | ORAL | 1 refills | Status: DC

## 2021-01-21 ENCOUNTER — Other Ambulatory Visit: Payer: Self-pay | Admitting: Plastic Surgery

## 2021-01-21 DIAGNOSIS — Z411 Encounter for cosmetic surgery: Secondary | ICD-10-CM

## 2021-01-24 LAB — CBC WITH DIFFERENTIAL
Basophils Absolute: 0.1 x10E3/uL (ref 0.0–0.2)
Basos: 2 %
EOS (ABSOLUTE): 0.3 x10E3/uL (ref 0.0–0.4)
Eos: 6 %
Hematocrit: 36.1 % (ref 34.0–46.6)
Hemoglobin: 11.6 g/dL (ref 11.1–15.9)
Immature Grans (Abs): 0 x10E3/uL (ref 0.0–0.1)
Immature Granulocytes: 0 %
Lymphocytes Absolute: 1.8 x10E3/uL (ref 0.7–3.1)
Lymphs: 35 %
MCH: 27.2 pg (ref 26.6–33.0)
MCHC: 32.1 g/dL (ref 31.5–35.7)
MCV: 85 fL (ref 79–97)
Monocytes Absolute: 0.5 x10E3/uL (ref 0.1–0.9)
Monocytes: 10 %
Neutrophils Absolute: 2.4 x10E3/uL (ref 1.4–7.0)
Neutrophils: 47 %
RBC: 4.26 x10E6/uL (ref 3.77–5.28)
RDW: 13 % (ref 11.7–15.4)
WBC: 5.1 x10E3/uL (ref 3.4–10.8)

## 2021-01-24 LAB — COMPREHENSIVE METABOLIC PANEL
ALT: 17 IU/L (ref 0–32)
AST: 23 IU/L (ref 0–40)
Albumin/Globulin Ratio: 2 (ref 1.2–2.2)
Albumin: 4.7 g/dL (ref 3.8–4.9)
Alkaline Phosphatase: 91 IU/L (ref 44–121)
BUN/Creatinine Ratio: 13 (ref 9–23)
BUN: 13 mg/dL (ref 6–24)
Bilirubin Total: <0.2 mg/dL (ref 0.0–1.2)
CO2: 24 mmol/L (ref 20–29)
Calcium: 9.3 mg/dL (ref 8.7–10.2)
Chloride: 104 mmol/L (ref 96–106)
Creatinine, Ser: 0.99 mg/dL (ref 0.57–1.00)
Globulin, Total: 2.4 g/dL (ref 1.5–4.5)
Glucose: 110 mg/dL — ABNORMAL HIGH (ref 65–99)
Potassium: 4.8 mmol/L (ref 3.5–5.2)
Sodium: 142 mmol/L (ref 134–144)
Total Protein: 7.1 g/dL (ref 6.0–8.5)
eGFR: 68 mL/min/{1.73_m2} (ref >59)

## 2021-01-24 LAB — LIPID PANEL WITH LDL/HDL RATIO
Cholesterol, Total: 251 mg/dL — ABNORMAL HIGH (ref 100–199)
HDL: 77 mg/dL (ref >39)
LDL Chol Calc (NIH): 158 mg/dL — ABNORMAL HIGH (ref 0–99)
LDL/HDL Ratio: 2.1 ratio (ref 0.0–3.2)
Triglycerides: 92 mg/dL (ref 0–149)
VLDL Cholesterol Cal: 16 mg/dL (ref 5–40)

## 2021-01-24 LAB — VITAMIN D 25 HYDROXY (VIT D DEFICIENCY, FRACTURES): Vit D, 25-Hydroxy: 33.4 ng/mL (ref 30.0–100.0)

## 2021-01-24 LAB — IRON,TIBC AND FERRITIN PANEL
Ferritin: 17 ng/mL (ref 15–150)
Iron Saturation: 12 % — ABNORMAL LOW (ref 15–55)
Iron: 40 ug/dL (ref 27–159)
Total Iron Binding Capacity: 334 ug/dL (ref 250–450)
UIBC: 294 ug/dL (ref 131–425)

## 2021-01-24 LAB — TSH+FREE T4
Free T4: 1.21 ng/dL (ref 0.82–1.77)
TSH: 0.536 u[IU]/mL (ref 0.450–4.500)

## 2021-01-24 LAB — B12 AND FOLATE PANEL
Folate: 8 ng/mL (ref >3.0)
Vitamin B-12: 1183 pg/mL (ref 232–1245)

## 2021-01-28 ENCOUNTER — Encounter: Payer: BC Managed Care – PPO | Admitting: Physician Assistant

## 2021-01-28 ENCOUNTER — Telehealth: Payer: Self-pay

## 2021-01-28 NOTE — Telephone Encounter (Signed)
Patient asked me to document that she is cancelling her appointment for 01/30/2021 since this was a follow-up appointment to discuss her ultrasound results and she won't be able to have her ultrasound until 02/13/2021.  Patient said that she is still hurting really bad.

## 2021-01-28 NOTE — Telephone Encounter (Signed)
Returned patients call. Was not able to leave message, voicemail was full.

## 2021-01-30 ENCOUNTER — Ambulatory Visit: Payer: BC Managed Care – PPO

## 2021-02-06 NOTE — Progress Notes (Signed)
01/17/21-  Patient in office today for evaluation of the following symptoms: Increased pain 6/10  Right side > Left She states swelling has increased & pain is described as "burning to touch" On exam- there is slightly more swelling of right breast- but no marked asymmetry Slight redness at both incision sites, but no signs of infection/no drainage Denies fever/chills. She continues to wear her sports bra for compression. I consulted with Dr. Claudia Desanctis & he assessed her incisions- & agreed that there is no obvious signs of complications- he will order an ultrasound for further evaluation & he will order RX- for inflammation. Pt agrees with the above plan of care. She is reminded to call for any other concerns. She is scheduled for ultrasound on 02/13/21 & f/u with Dr. Claudia Desanctis on 02/14/21

## 2021-02-11 ENCOUNTER — Other Ambulatory Visit: Payer: Self-pay | Admitting: Internal Medicine

## 2021-02-11 ENCOUNTER — Ambulatory Visit (INDEPENDENT_AMBULATORY_CARE_PROVIDER_SITE_OTHER): Payer: BC Managed Care – PPO | Admitting: Physician Assistant

## 2021-02-11 ENCOUNTER — Telehealth: Payer: Self-pay | Admitting: Plastic Surgery

## 2021-02-11 ENCOUNTER — Other Ambulatory Visit: Payer: Self-pay

## 2021-02-11 ENCOUNTER — Encounter: Payer: Self-pay | Admitting: Physician Assistant

## 2021-02-11 VITALS — BP 118/74 | HR 68 | Temp 97.8°F | Resp 16 | Ht 64.0 in | Wt 170.6 lb

## 2021-02-11 DIAGNOSIS — K219 Gastro-esophageal reflux disease without esophagitis: Secondary | ICD-10-CM

## 2021-02-11 DIAGNOSIS — Q8500 Neurofibromatosis, unspecified: Secondary | ICD-10-CM

## 2021-02-11 DIAGNOSIS — D509 Iron deficiency anemia, unspecified: Secondary | ICD-10-CM

## 2021-02-11 DIAGNOSIS — E7800 Pure hypercholesterolemia, unspecified: Secondary | ICD-10-CM

## 2021-02-11 DIAGNOSIS — G43909 Migraine, unspecified, not intractable, without status migrainosus: Secondary | ICD-10-CM

## 2021-02-11 DIAGNOSIS — Z0001 Encounter for general adult medical examination with abnormal findings: Secondary | ICD-10-CM

## 2021-02-11 DIAGNOSIS — L601 Onycholysis: Secondary | ICD-10-CM

## 2021-02-11 DIAGNOSIS — R300 Dysuria: Secondary | ICD-10-CM

## 2021-02-11 DIAGNOSIS — G894 Chronic pain syndrome: Secondary | ICD-10-CM

## 2021-02-11 DIAGNOSIS — N644 Mastodynia: Secondary | ICD-10-CM | POA: Diagnosis not present

## 2021-02-11 DIAGNOSIS — Z124 Encounter for screening for malignant neoplasm of cervix: Secondary | ICD-10-CM | POA: Diagnosis not present

## 2021-02-11 DIAGNOSIS — Z9882 Breast implant status: Secondary | ICD-10-CM | POA: Diagnosis not present

## 2021-02-11 MED ORDER — SUMATRIPTAN SUCCINATE 100 MG PO TABS: ORAL_TABLET | ORAL | 1 refills | Status: DC

## 2021-02-11 MED ORDER — PANTOPRAZOLE SODIUM 40 MG PO TBEC: 40.0000 mg | DELAYED_RELEASE_TABLET | Freq: Two times a day (BID) | ORAL | 5 refills | Status: DC

## 2021-02-11 MED ORDER — TOBRAMYCIN 0.3 % OP SOLN: OPHTHALMIC | 1 refills | Status: DC

## 2021-02-11 NOTE — Telephone Encounter (Signed)
Returned patients call. LMVM, questioning if anything has changed since her last visit at the office. Leakage, opening, odor, discharge, one breast larger than the other, swelling, redness, fever, chills, nausea or vomiting. May alternate tylenol with IBU along alternating cold/hot compress. Reminded her [place] appointment is this Wednesday and follow up appointment with Dr. Claudia Desanctis the day after on 02/14/21.  Dr. Claudia Desanctis will have a better view for observation once the [place] has been done. Should the pain continue to get worse before her appointment, go to the ED.

## 2021-02-11 NOTE — Progress Notes (Signed)
[facility] [place],  [address]  Internal MEDICINE  Office Visit Note  Patient Name: Andrea Bernard  [id number]  [id number]  Date of Service: 02/13/2021  Chief Complaint  Patient presents with  . Annual Exam    Pt refused pap at this time  . Breast Pain    Left side and goes around into back since march 8th, 22.  Appt wed for ultrasound and appt to see plastic surgeon thursday  . Results    On labs she is concerned about her blood cells  . Edema    And inflammation when she has salt intake.  Has fingers lock up on both hands     HPI Pt is here for routine health maintenance examination -Hx of NF--optic glioma, lots of tumors; has masses removed when then get large and as able. Esp arms and legs. At 30 started having lots of pain and has been on a pain regimen since then -Had a hernia repair with tummy tuck and removal of 20 small masses then. Then had breast augmentation as well. This was 8 weeks ago and pain in left breast started 1 week later. Th pain is diffuse through left breast and tender to touch. She has an appt for a breast US and mammogram on Wednesday and then goes back Thursday for appt with gen surgery.  -refused pap today given current pain and recovering from surgery. Minor abdominal tenderness to palpation as well -Labs reviewed, iron low and will try to supplement. She has a history of low iron and has had infusions in the past, hemoglobin is stable -Refused statin for now and wants to improve diet first, discussed that cholesterol has worsened since last year, but pt reports she will do better with diet  Current Medication: Outpatient Encounter Medications as of 02/11/2021  Medication Sig  . acetaminophen (TYLENOL) 325 MG tablet Take 650 mg by mouth.  [name] albuterol (VENTOLIN HFA) 108 (90 Base) MCG/ACT inhaler TAKE 2 PUFFS BY MOUTH EVERY 6 HOURS AS NEEDED FOR WHEEZE OR SHORTNESS OF BREATH  . cyclobenzaprine (FLEXERIL) 10 MG  tablet Take 10 mg by mouth daily.  [name] gabapentin (NEURONTIN) 300 MG capsule Take 300 mg by mouth 3 (three) times daily.  . OXYCONTIN 80 MG 12 hr tablet Take 80 mg by mouth every 12 (twelve) hours.  . [DISCONTINUED] pantoprazole (PROTONIX) 40 MG tablet Take 1 tablet (40 mg total) by mouth 2 (two) times daily.  . [DISCONTINUED] SUMAtriptan (IMITREX) 100 MG tablet TAKE 1 TABLET BY MOUTH DAILY AS NEEDED FOR MIGRAINE.  . [DISCONTINUED] tobramycin (TOBREX) 0.3 % ophthalmic solution APPLY 1 DROP TO TOENAILS AT BEDTIME  . pantoprazole (PROTONIX) 40 MG tablet Take 1 tablet (40 mg total) by mouth 2 (two) times daily.  . SUMAtriptan (IMITREX) 100 MG tablet TAKE 1 TABLET BY MOUTH DAILY AS NEEDED FOR MIGRAINE.  [name] tobramycin (TOBREX) 0.3 % ophthalmic solution APPLY 1 DROP TO TOENAILS AT BEDTIME  . [DISCONTINUED] amoxicillin-clavulanate (AUGMENTIN) 875-125 MG tablet Take 1 tablet by mouth 2 (two) times daily. (Patient not taking: Reported on 02/11/2021)  . [DISCONTINUED] celecoxib (CELEBREX) 200 MG capsule Take 1 capsule (200 mg total) by mouth 2 (two) times daily. (Patient not taking: Reported on 02/11/2021)  . [DISCONTINUED] ondansetron (ZOFRAN) 4 MG tablet Take 1 tablet (4 mg total) by mouth every 8 (eight) hours as needed for nausea or vomiting. (Patient not taking: Reported on 02/11/2021)   Facility-Administered Encounter Medications as of 02/11/2021  Medication  .  iron sucrose (VENOFER) 200 mg IVPB  . sodium chloride flush (NS) 0.9 % injection 10 mL    Surgical History: Past Surgical History:  Procedure Laterality Date  . BREAST ENHANCEMENT SURGERY Bilateral 12/04/2020  . carpel tunnel release    . CESAREAN SECTION     x 4  . COLONOSCOPY WITH PROPOFOL N/A 09/04/2017   Procedure: COLONOSCOPY WITH PROPOFOL;  Surgeon: [name], MD;  Location: [facility] ENDOSCOPY;  Service: Gastroenterology;  Laterality: N/A;  . COLONOSCOPY WITH PROPOFOL N/A 12/21/2017   Procedure: COLONOSCOPY WITH PROPOFOL;  Surgeon: [name], MD;  Location: [facility] ENDOSCOPY;  Service: Gastroenterology;  Laterality: N/A;  . ESOPHAGOGASTRODUODENOSCOPY (EGD) WITH PROPOFOL N/A 09/04/2017   Procedure: ESOPHAGOGASTRODUODENOSCOPY (EGD) WITH PROPOFOL;  Surgeon: [name], MD;  Location: [facility] ENDOSCOPY;  Service: Gastroenterology;  Laterality: N/A;  . HERNIA REPAIR    . HERNIA REPAIR  12/04/2020  . TUBAL LIGATION    . tummy tuck  12/04/2020   removed 20 masses on stomach  . TUMOR REMOVAL      Medical History: Past Medical History:  Diagnosis Date  . Arthritis   . Dysplastic nevus 03/02/2008   Right lateral thigh. Moderate atypia, limited margins free.  [name] GERD (gastroesophageal reflux disease)   . Headache   . Iron deficiency anemia 06/15/2017  . Neurofibromatosis ([place])     Family History: Family History  Problem Relation Age of Onset  . Anemia Mother   . Bladder Cancer Neg Hx   . Kidney cancer Neg Hx   . Breast cancer Neg Hx   . Ovarian cancer Neg Hx   . Colon cancer Neg Hx       Review of Systems  Constitutional: Negative for chills, fatigue and unexpected weight change.  HENT: Negative for congestion, postnasal drip, rhinorrhea, sneezing and sore throat.   Eyes: Negative for redness.  Respiratory: Negative for cough, chest tightness and shortness of breath.   Cardiovascular: Negative for chest pain and palpitations.       Pain in left breast, non radiating  Gastrointestinal: Negative for abdominal pain, constipation, diarrhea, nausea and vomiting.  Genitourinary: Negative for dysuria and frequency.  Musculoskeletal: Negative for arthralgias, back pain, joint swelling and neck pain.  Skin: Negative for rash.  Neurological: Negative.  Negative for tremors and numbness.  Hematological: Negative for adenopathy. Does not bruise/bleed easily.  Psychiatric/Behavioral: Negative for behavioral problems (Depression), sleep disturbance and suicidal ideas. The patient is nervous/anxious.      Vital Signs: BP  118/74   Pulse 68   Temp 97.8 F (36.6 C)   Resp 16   Ht 5' 4"  (1.626 m)   Wt 170 lb 9.6 oz (77.4 kg)   LMP 12/18/2017   SpO2 95%   BMI 29.28 kg/m    Physical Exam Vitals and nursing note reviewed.  Constitutional:      General: She is not in acute distress.    Appearance: She is well-developed. She is not diaphoretic.  HENT:     Head: Normocephalic and atraumatic.     Mouth/Throat:     Pharynx: No oropharyngeal exudate.  Eyes:     Pupils: Pupils are equal, round, and reactive to light.  Neck:     Thyroid: No thyromegaly.     Vascular: No JVD.     Trachea: No tracheal deviation.  Cardiovascular:     Rate and Rhythm: Normal rate and regular rhythm.     Heart sounds: Normal heart sounds. No murmur heard. No friction rub. No  gallop.   Pulmonary:     Effort: Pulmonary effort is normal. No respiratory distress.     Breath sounds: No wheezing or rales.  Chest:     Chest wall: No tenderness.  Breasts:     Right: Normal. No mass.     Left: Tenderness present. No mass.      Comments: Diffuse left breast tenderness to palpation, no erythema or rash present Abdominal:     General: Bowel sounds are normal.     Palpations: Abdomen is soft.     Tenderness: There is abdominal tenderness.  Musculoskeletal:        General: Normal range of motion.     Cervical back: Normal range of motion and neck supple.  Lymphadenopathy:     Cervical: No cervical adenopathy.  Skin:    General: Skin is warm and dry.     Findings: No rash.  Neurological:     Mental Status: She is alert and oriented to person, place, and time.     Cranial Nerves: No cranial nerve deficit.  Psychiatric:        Behavior: Behavior normal.        Thought Content: Thought content normal.        Judgment: Judgment normal.      LABS: Recent Results (from the past 2160 hour(s))  CBC With Differential     Status: None   Collection Time: 01/23/21  4:50 PM  Result Value Ref Range   WBC 5.1 3.4 - 10.8 x10E3/uL    RBC 4.26 3.77 - 5.28 x10E6/uL   Hemoglobin 11.6 11.1 - 15.9 g/dL   Hematocrit 36.1 34.0 - 46.6 %   MCV 85 79 - 97 fL   MCH 27.2 26.6 - 33.0 pg   MCHC 32.1 31.5 - 35.7 g/dL   RDW 13.0 11.7 - 15.4 %   Neutrophils 47 Not Estab. %   Lymphs 35 Not Estab. %   Monocytes 10 Not Estab. %   Eos 6 Not Estab. %   Basos 2 Not Estab. %   Neutrophils Absolute 2.4 1.4 - 7.0 x10E3/uL   Lymphocytes Absolute 1.8 0.7 - 3.1 x10E3/uL   Monocytes Absolute 0.5 0.1 - 0.9 x10E3/uL   EOS (ABSOLUTE) 0.3 0.0 - 0.4 x10E3/uL   Basophils Absolute 0.1 0.0 - 0.2 x10E3/uL   Immature Granulocytes 0 Not Estab. %   Immature Grans (Abs) 0.0 0.0 - 0.1 x10E3/uL  Comprehensive metabolic panel     Status: Abnormal   Collection Time: 01/23/21  4:50 PM  Result Value Ref Range   Glucose 110 (H) 65 - 99 mg/dL   BUN 13 6 - 24 mg/dL   Creatinine, Ser 0.99 0.57 - 1.00 mg/dL   eGFR 68 >59 mL/min/1.73   BUN/Creatinine Ratio 13 9 - 23   Sodium 142 134 - 144 mmol/L   Potassium 4.8 3.5 - 5.2 mmol/L   Chloride 104 96 - 106 mmol/L   CO2 24 20 - 29 mmol/L   Calcium 9.3 8.7 - 10.2 mg/dL   Total Protein 7.1 6.0 - 8.5 g/dL   Albumin 4.7 3.8 - 4.9 g/dL   Globulin, Total 2.4 1.5 - 4.5 g/dL   Albumin/Globulin Ratio 2.0 1.2 - 2.2   Bilirubin Total <0.2 0.0 - 1.2 mg/dL   Alkaline Phosphatase 91 44 - 121 IU/L   AST 23 0 - 40 IU/L   ALT 17 0 - 32 IU/L  Lipid Panel With LDL/HDL Ratio     Status: Abnormal  Collection Time: 01/23/21  4:50 PM  Result Value Ref Range   Cholesterol, Total 251 (H) 100 - 199 mg/dL   Triglycerides 92 0 - 149 mg/dL   HDL 77 >39 mg/dL   VLDL Cholesterol Cal 16 5 - 40 mg/dL   LDL Chol Calc (NIH) 158 (H) 0 - 99 mg/dL   LDL/HDL Ratio 2.1 0.0 - 3.2 ratio    Comment:                                     LDL/HDL Ratio                                             Men  Women                               1/2 Avg.Risk  1.0    1.5                                   Avg.Risk  3.6    3.2                                 2X Avg.Risk  6.2    5.0                                3X Avg.Risk  8.0    6.1   TSH + free T4     Status: None   Collection Time: 01/23/21  4:50 PM  Result Value Ref Range   TSH 0.536 0.450 - 4.500 uIU/mL   Free T4 1.21 0.82 - 1.77 ng/dL  VITAMIN D 25 Hydroxy (Vit-D Deficiency, Fractures)     Status: None   Collection Time: 01/23/21  4:50 PM  Result Value Ref Range   Vit D, 25-Hydroxy 33.4 30.0 - 100.0 ng/mL    Comment: Vitamin D deficiency has been defined by the [place] and an Endocrine Society practice guideline as a level of serum 25-OH vitamin D less than 20 ng/mL (1,2). The Endocrine Society went on to further define vitamin D insufficiency as a level between 21 and 29 ng/mL (2). 1. IOM (Institute of Medicine). 2010. Dietary reference    intakes for calcium and D. [place]: The    [employer]. 2. Holick MF, Binkley [place], Bischoff-Ferrari HA, et al.    Evaluation, treatment, and prevention of vitamin D    deficiency: an Endocrine Society clinical practice    guideline. JCEM. 2011 Jul; 96(7):1911-30.   B12 and Folate Panel     Status: None   Collection Time: 01/23/21  4:50 PM  Result Value Ref Range   Vitamin B-12 1,183 232 - 1,245 pg/mL   Folate 8.0 >3.0 ng/mL    Comment: A serum folate concentration of less than 3.1 ng/mL is considered to represent clinical deficiency.   Fe+TIBC+Fer     Status: Abnormal   Collection Time: 01/23/21  5:09 PM  Result Value Ref Range   Total Iron Binding Capacity 334 250 - 450 ug/dL  UIBC 294 131 - 425 ug/dL   Iron 40 27 - 159 ug/dL   Iron Saturation 12 (L) 15 - 55 %   Ferritin 17 15 - 150 ng/mL  UA/M w/rflx Culture, Routine     Status: None   Collection Time: 02/11/21 10:11 AM   Specimen: Urine   Urine  Result Value Ref Range   Specific Gravity, UA 1.022 1.005 - 1.030   pH, UA 6.0 5.0 - 7.5   Color, UA Yellow Yellow   Appearance Ur Clear Clear   Leukocytes,UA Negative Negative   Protein,UA Negative  Negative/Trace   Glucose, UA Negative Negative   Ketones, UA Negative Negative   RBC, UA Negative Negative   Bilirubin, UA Negative Negative   Urobilinogen, Ur 0.2 0.2 - 1.0 mg/dL   Nitrite, UA Negative Negative   Microscopic Examination Comment     Comment: Microscopic follows if indicated.   Microscopic Examination See below:     Comment: Microscopic was indicated and was performed.   Urinalysis Reflex Comment     Comment: This specimen will not reflex to a Urine Culture.  Microscopic Examination     Status: Abnormal   Collection Time: 02/11/21 10:11 AM   Urine  Result Value Ref Range   WBC, UA None seen 0 - 5 /hpf   RBC 0-2 0 - 2 /hpf   Epithelial Cells (non renal) >10 (A) 0 - 10 /hpf   Casts None seen None seen /lpf   Bacteria, UA Few None seen/Few  CBC with Differential     Status: Abnormal   Collection Time: 02/12/21 11:15 PM  Result Value Ref Range   WBC 6.0 4.0 - 10.5 K/uL   RBC 3.96 3.87 - 5.11 MIL/uL   Hemoglobin 10.6 (L) 12.0 - 15.0 g/dL   HCT 33.3 (L) 36.0 - 46.0 %   MCV 84.1 80.0 - 100.0 fL   MCH 26.8 26.0 - 34.0 pg   MCHC 31.8 30.0 - 36.0 g/dL   RDW 12.6 11.5 - 15.5 %   Platelets 199 150 - 400 K/uL    Comment: PLATELET COUNT CONFIRMED BY SMEAR   nRBC 0.0 0.0 - 0.2 %   Neutrophils Relative % 45 %   Neutro Abs 2.8 1.7 - 7.7 K/uL   Lymphocytes Relative 33 %   Lymphs Abs 2.0 0.7 - 4.0 K/uL   Monocytes Relative 11 %   Monocytes Absolute 0.7 0.1 - 1.0 K/uL   Eosinophils Relative 8 %   Eosinophils Absolute 0.5 0.0 - 0.5 K/uL   Basophils Relative 2 %   Basophils Absolute 0.1 0.0 - 0.1 K/uL   WBC Morphology MORPHOLOGY UNREMARKABLE    RBC Morphology MORPHOLOGY UNREMARKABLE    Smear Review MORPHOLOGY UNREMARKABLE    Immature Granulocytes 1 %   Abs Immature Granulocytes 0.03 0.00 - 0.07 K/uL    Comment: Performed at [facility], [address] [place], [place] [address]  Comprehensive metabolic panel     Status: Abnormal   Collection Time: 02/12/21  11:15 PM  Result Value Ref Range   Sodium 139 135 - 145 mmol/L   Potassium 4.1 3.5 - 5.1 mmol/L    Comment: HEMOLYSIS AT THIS LEVEL MAY AFFECT RESULT   Chloride 107 98 - 111 mmol/L   CO2 23 22 - 32 mmol/L   Glucose, Bld 112 (H) 70 - 99 mg/dL    Comment: Glucose reference range applies only to samples taken after fasting for at least 8 hours.   BUN  16 6 - 20 mg/dL   Creatinine, Ser 0.89 0.44 - 1.00 mg/dL   Calcium 8.6 (L) 8.9 - 10.3 mg/dL   Total Protein 6.5 6.5 - 8.1 g/dL   Albumin 3.7 3.5 - 5.0 g/dL   AST 34 15 - 41 U/L   ALT 16 0 - 44 U/L   Alkaline Phosphatase 64 38 - 126 U/L   Total Bilirubin 0.7 0.3 - 1.2 mg/dL   GFR, Estimated >60 >60 mL/min    Comment: (NOTE) Calculated using the CKD-EPI Creatinine Equation (2021)    Anion gap 9 5 - 15    Comment: Performed at [facility], [place], [place] [address]  Troponin I (High Sensitivity)     Status: None   Collection Time: 02/12/21 11:15 PM  Result Value Ref Range   Troponin I (High Sensitivity) 3 <18 ng/L    Comment: (NOTE) Elevated high sensitivity troponin I (hsTnI) values and significant  changes across serial measurements may suggest ACS but many other  chronic and acute conditions are known to elevate hsTnI results.  Refer to the "Links" section for chest pain algorithms and additional  guidance. Performed at [facility], [address], [place], [place] [address]        Assessment/Plan: 1. Encounter for general adult medical examination with abnormal findings Reviewed recent lab work, colonoscopy UTD and mammogram scheduled  2. Pain of left breast Diffuse tenderness in left breast x1 week w/ recent breast augmentation--has upcoming [place] and mammogram and f/u with surgeon to address  3. History of breast augmentation New pain in left breast x1 week after recent augmentation. [place] and mammogram scheduled and f/u with surgeon  4. Migraine syndrome May continue sumatriptan as  needed - SUMAtriptan (IMITREX) 100 MG tablet; TAKE 1 TABLET BY MOUTH DAILY AS NEEDED FOR MIGRAINE.  Dispense: 9 tablet; Refill: 1  5. Gastroesophageal reflux disease without esophagitis Continue protonix - pantoprazole (PROTONIX) 40 MG tablet; Take 1 tablet (40 mg total) by mouth 2 (two) times daily.  Dispense: 60 tablet; Refill: 5  6. Iron deficiency anemia, unspecified iron deficiency anemia type Low iron saturation, but stable hemoglobin--will supplement iron and consider follow up with heme/onc if not tolerated given hx of infusions  7. Neurofibromatosis ([place]) Diagnosed in 70s, managing pains  8. Chronic pain syndrome Long term pain management  9. Hypercholesterolemia Discussed starting statin however pt refused at this time and will work on diet and exercise. Will monitor  10. Onycholysis of toenail - tobramycin (TOBREX) 0.3 % ophthalmic solution; APPLY 1 DROP TO TOENAILS AT BEDTIME  Dispense: 5 mL; Refill: 1  11. Dysuria - UA/M w/rflx Culture, Routine   General Counseling: Hydee verbalizes understanding of the findings of todays visit and agrees with plan of treatment. I have discussed any further diagnostic evaluation that may be needed or ordered today. We also reviewed her medications today. she has been encouraged to call the office with any questions or concerns that should arise related to todays visit.    Counseling:    Orders Placed This Encounter  Procedures  . Microscopic Examination  . UA/M w/rflx Culture, Routine    Meds ordered this encounter  Medications  . SUMAtriptan (IMITREX) 100 MG tablet    Sig: TAKE 1 TABLET BY MOUTH DAILY AS NEEDED FOR MIGRAINE.    Dispense:  9 tablet    Refill:  1    DX Code Needed  .  . pantoprazole (PROTONIX) 40 MG tablet  Sig: Take 1 tablet (40 mg total) by mouth 2 (two) times daily.    Dispense:  60 tablet    Refill:  5  . tobramycin (TOBREX) 0.3 % ophthalmic solution    Sig: APPLY 1 DROP TO TOENAILS AT BEDTIME     Dispense:  5 mL    Refill:  1    DX Code Needed  .    This patient was seen by Drema Dallas, PA-C in collaboration with Dr. Clayborn Bigness as a part of collaborative care agreement.  Total time spent:40 Minutes  Time spent includes review of chart, medications, test results, and follow up plan with the patient.     [name], MD  Internal Medicine

## 2021-02-11 NOTE — Telephone Encounter (Signed)
Please refill at he time of visit

## 2021-02-11 NOTE — Telephone Encounter (Signed)
Patient called to say that she is in such excruciating pain she cannot take a breath. She said the pain is on the left side. She needs something to be done. She is scheduled to see her PCP today and has her ultrasound scheduled. I advised patient that both of our providers are in the OR so it may be a delay getting a response, but if the pain worsens she should go to the ED. Please call patient to advise.

## 2021-02-12 ENCOUNTER — Emergency Department
Admission: EM | Admit: 2021-02-12 | Discharge: 2021-02-13 | Disposition: A | Discharge status: Home / Self Care | Payer: BC Managed Care – PPO | Attending: Emergency Medicine | Admitting: Emergency Medicine

## 2021-02-12 ENCOUNTER — Emergency Department: Payer: BC Managed Care – PPO

## 2021-02-12 ENCOUNTER — Other Ambulatory Visit: Payer: Self-pay

## 2021-02-12 DIAGNOSIS — R531 Weakness: Secondary | ICD-10-CM | POA: Diagnosis not present

## 2021-02-12 DIAGNOSIS — R21 Rash and other nonspecific skin eruption: Secondary | ICD-10-CM | POA: Diagnosis present

## 2021-02-12 DIAGNOSIS — R079 Chest pain, unspecified: Secondary | ICD-10-CM | POA: Insufficient documentation

## 2021-02-12 DIAGNOSIS — B029 Zoster without complications: Secondary | ICD-10-CM | POA: Diagnosis not present

## 2021-02-12 DIAGNOSIS — R42 Dizziness and giddiness: Secondary | ICD-10-CM | POA: Diagnosis not present

## 2021-02-12 LAB — UA/M W/RFLX CULTURE, ROUTINE
Bilirubin, UA: NEGATIVE
Glucose, UA: NEGATIVE
Ketones, UA: NEGATIVE
Leukocytes,UA: NEGATIVE
Nitrite, UA: NEGATIVE
Protein,UA: NEGATIVE
RBC, UA: NEGATIVE
Specific Gravity, UA: 1.022 (ref 1.005–1.030)
Urobilinogen, Ur: 0.2 mg/dL (ref 0.2–1.0)
pH, UA: 6 (ref 5.0–7.5)

## 2021-02-12 LAB — MICROSCOPIC EXAMINATION
Casts: NONE SEEN /lpf
Epithelial Cells (non renal): >10 /hpf — AB (ref 0–10)
WBC, UA: NONE SEEN /hpf (ref 0–5)

## 2021-02-12 MED ORDER — VALACYCLOVIR HCL 500 MG PO TABS
1000.0000 mg | ORAL_TABLET | Freq: Once | ORAL | Status: AC
  Administered 2021-02-13: 1000 mg via ORAL
  Filled 2021-02-12: qty 2

## 2021-02-12 MED ORDER — KETOROLAC TROMETHAMINE 60 MG/2ML IM SOLN
30.0000 mg | Freq: Once | INTRAMUSCULAR | Status: AC
  Administered 2021-02-13: 30 mg via INTRAMUSCULAR
  Filled 2021-02-12: qty 2

## 2021-02-12 MED ORDER — OXYCODONE-ACETAMINOPHEN 5-325 MG PO TABS
1.0000 | ORAL_TABLET | Freq: Once | ORAL | Status: AC
  Administered 2021-02-13: 1 via ORAL
  Filled 2021-02-12: qty 1

## 2021-02-12 MED ORDER — LIDOCAINE HCL URETHRAL/MUCOSAL 2 % EX GEL
1.0000 "application " | Freq: Once | CUTANEOUS | Status: AC
  Administered 2021-02-13: 1 via TOPICAL
  Filled 2021-02-12: qty 10

## 2021-02-12 MED ORDER — KETOROLAC TROMETHAMINE 30 MG/ML IJ SOLN: 15.0000 mg | Freq: Once | INTRAMUSCULAR | Status: DC

## 2021-02-12 NOTE — Progress Notes (Signed)
Pt has IDA. Please check

## 2021-02-12 NOTE — ED Provider Notes (Incomplete)
[facility] Emergency Department Provider Note   ____________________________________________   Event Date/Time   First MD Initiated Contact with Patient 02/12/21 2307     (approximate)  I have reviewed the triage vital signs and the nursing notes.   HISTORY  Chief Complaint Rash, chest pain, weakness   HPI Andrea Bernard is a 55 y.o. female who presents to the ED from home with a chief complaint of rash, chest pain and weakness.  Patient had breast augmentation 8 weeks ago.  Reports a 5-day history of rash under her left bra line radiating to her back.  Describes pain as sharp and associated with generalized weakness.  Has an appointment for breast ultrasound tomorrow and         {**SYMPTOM/COMPLAINT  LOCATION (describe anatomically) DURATION (when did it start) TIMING (onset and pattern) SEVERITY (0-10, mild/moderate/severe) QUALITY (description of symptoms) CONTEXT (recent surgery, new meds, activity, etc.) MODIFYINGFACTORS (what makes it better/worse) ASSOCIATEDSYMPTOMS (pertinent positives and negatives)**} Past Medical History:  Diagnosis Date  . Arthritis   . Dysplastic nevus 03/02/2008   Right lateral thigh. Moderate atypia, limited margins free.  [name] GERD (gastroesophageal reflux disease)   . Headache   . Iron deficiency anemia 06/15/2017  . Neurofibromatosis [facility])     Patient Active Problem List   Diagnosis Date Noted  . Respiratory infection 10/25/2020  . ANA positive 02/06/2020  . 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 Neurofibroma of foot 07/03/2019  . Migraine syndrome 07/03/2019  . Gastroesophageal reflux disease without esophagitis 07/03/2019  . Acute upper respiratory infection 06/16/2019  . Vaginal yeast infection 03/29/2019  . Fever 12/31/2018  . Cough 12/31/2018  . Unspecified menopausal and perimenopausal disorder 09/15/2018  . Fatigue 09/15/2018  . Neurofibromatosis ([place]) 03/27/2018  . Intractable  chronic migraine without aura and without status migrainosus 03/03/2018  . Acute non-recurrent maxillary sinusitis 02/10/2018  . Vasomotor rhinitis 02/10/2018  . Chronic pain syndrome 01/05/2018  . Long term current use of opiate analgesic 01/05/2018  . Pharmacologic therapy 01/05/2018  . Disorder of skeletal system 01/05/2018  . Problems influencing health status 01/05/2018  . Chronic neck pain (Primary Area of Pain)(right) 01/05/2018  . Chronic pain of both hips  [facility] Area of Pain) (L>R) 01/05/2018  . Chronic bilateral low back pain without sciatica  (Fourth Area of Pain)) 01/05/2018  . Chronic pain of right upper extremity (Secondary Area of Pain) 01/05/2018  . Encounter for general adult medical examination with abnormal findings 12/27/2017  . Abnormal weight gain 12/27/2017  . Menorrhagia with regular cycle 12/27/2017  . Dysuria 12/27/2017  . Urinary tract infectious disease 10/27/2017  . Iron deficiency anemia 06/15/2017  . History of neuroblastoma 06/15/2017  . Chronic pain 06/15/2017  . Angiolipomatosis, familial 02/26/2017  . Lipoma of left upper extremity 02/25/2017  . Multiple lipomas 02/25/2017  . 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 Pain of multiple extremities 07/13/2015  . 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 BREAST ENHANCEMENT SURGERY Bilateral 12/04/2020  . carpel tunnel release    . CESAREAN SECTION     x 4  . COLONOSCOPY WITH PROPOFOL N/A 09/04/2017   Procedure: COLONOSCOPY WITH PROPOFOL;  Surgeon: [name], MD;  Location: [facility] ENDOSCOPY;  Service: Gastroenterology;  Laterality: N/A;  . COLONOSCOPY WITH PROPOFOL N/A 12/21/2017   Procedure: COLONOSCOPY WITH PROPOFOL;  Surgeon: [name], MD;  Location: [facility] ENDOSCOPY;  Service: Gastroenterology;  Laterality: N/A;  .  ESOPHAGOGASTRODUODENOSCOPY (EGD) WITH PROPOFOL N/A 09/04/2017   Procedure:  ESOPHAGOGASTRODUODENOSCOPY (EGD) WITH PROPOFOL;  Surgeon: [name], MD;  Location: [facility] ENDOSCOPY;  Service: Gastroenterology;  Laterality: N/A;  . HERNIA REPAIR    . HERNIA REPAIR  12/04/2020  . TUBAL LIGATION    . tummy tuck  12/04/2020   removed 20 masses on stomach  . TUMOR REMOVAL      Prior to Admission medications   Medication Sig Start Date End Date Taking? Authorizing Provider  acetaminophen (TYLENOL) 325 MG tablet Take 650 mg by mouth. 08/19/16   [provider]  albuterol (VENTOLIN HFA) 108 (90 Base) MCG/ACT inhaler TAKE 2 PUFFS BY MOUTH EVERY 6 HOURS AS NEEDED FOR WHEEZE OR SHORTNESS OF BREATH 12/13/20   [name], MD  cyclobenzaprine (FLEXERIL) 10 MG tablet Take 10 mg by mouth daily. 05/28/19   [provider]  gabapentin (NEURONTIN) 300 MG capsule Take 300 mg by mouth 3 (three) times daily. 01/29/16   [provider]  OXYCONTIN 80 MG 12 hr tablet Take 80 mg by mouth every 12 (twelve) hours. 01/29/16   [provider]  pantoprazole (PROTONIX) 40 MG tablet Take 1 tablet (40 mg total) by mouth 2 (two) times daily. 02/11/21   McDonough, Si Gaul, PA-C  SUMAtriptan (IMITREX) 100 MG tablet TAKE 1 TABLET BY MOUTH DAILY AS NEEDED FOR MIGRAINE. 02/11/21   McDonough, Lauren K, PA-C  tobramycin (TOBREX) 0.3 % ophthalmic solution APPLY 1 DROP TO TOENAILS AT BEDTIME 02/11/21   McDonough, Lauren K, PA-C    Allergies Nitrofuran derivatives  Family History  Problem Relation Age of Onset  . Anemia Mother   . Bladder Cancer Neg Hx   . Kidney cancer Neg Hx   . Breast cancer Neg Hx   . Ovarian cancer Neg Hx   . Colon cancer Neg Hx     Social History Social History   Tobacco Use  . Smoking status: Never Smoker  . Smokeless tobacco: Never Used  Vaping Use  . Vaping Use: Never used  Substance Use Topics  . Alcohol use: No  . Drug use: Not Currently    Review of Systems {** Revise as appropriate then delete this line - Documentation of 10 systems  is required  **} Constitutional: No fever/chills Eyes: No visual changes. ENT: No sore throat. Cardiovascular: Denies chest pain. Respiratory: Denies shortness of breath. Gastrointestinal: No abdominal pain.  No nausea, no vomiting.  No diarrhea.  No constipation. Genitourinary: Negative for dysuria. Musculoskeletal: Negative for back pain. Skin: Negative for rash. Neurological: Negative for headaches, focal weakness or numbness. {**Psychiatric:  Endocrine:  Hematological/Lymphatic:  Allergic/Immunilogical: **}  ____________________________________________   PHYSICAL EXAM:  VITAL SIGNS: ED Triage Vitals  Enc Vitals Group     BP 02/12/21 2209 (!) 147/93     Pulse Rate 02/12/21 2207 61     Resp 02/12/21 2207 18     Temp 02/12/21 2207 98.5 F (36.9 C)     Temp Source 02/12/21 2207 Oral     SpO2 02/12/21 2207 98 %     Weight 02/12/21 2207 170 lb 6.7 oz (77.3 kg)     Height 02/12/21 2207 5\' 4"  (1.626 m)     Head Circumference --      Peak Flow --      Pain Score 02/12/21 2206 10     Pain Loc --      Pain Edu? --      Excl. in [place]? --    {**  Revise as appropriate then delete this line - 8 systems required **} Constitutional: Alert and oriented. Well appearing and in no acute distress. Eyes: Conjunctivae are normal. PERRL. EOMI. Head: Atraumatic. Nose: No congestion/rhinnorhea. Mouth/Throat: Mucous membranes are moist.  Oropharynx non-erythematous. Neck: No stridor.  {**No cervical spine tenderness to palpation.**} {**Hematological/Lymphatic/Immunilogical: No cervical lymphadenopathy. **}Cardiovascular: Normal rate, regular rhythm. Grossly normal heart sounds.  Good peripheral circulation. Respiratory: Normal respiratory effort.  No retractions. Lungs CTAB. Gastrointestinal: Soft and nontender. No distention. No abdominal bruits. No CVA tenderness. {**Genitourinary:  **}Musculoskeletal: No lower extremity tenderness nor edema.  No joint effusions. Neurologic:  Normal  speech and language. No gross focal neurologic deficits are appreciated. No gait instability. Skin:  Skin is warm, dry and intact. No rash noted. Psychiatric: Mood and affect are normal. Speech and behavior are normal.  ____________________________________________   LABS (all labs ordered are listed, but only abnormal results are displayed)  Labs Reviewed  CBC WITH DIFFERENTIAL/PLATELET  COMPREHENSIVE METABOLIC PANEL  TROPONIN I (HIGH SENSITIVITY)   ____________________________________________  EKG  *** ____________________________________________  RADIOLOGY I, Cerra Eisenhower J, personally viewed and evaluated these images (plain radiographs) as part of my medical decision making, as well as reviewing the written report by the radiologist.  ED MD interpretation:  ***  Official radiology report(s): DG Chest 2 View  Result Date: 02/12/2021 CLINICAL DATA:  History of recent breast augmentation and tummy tuck procedure with chest pain, initial encounter EXAM: CHEST - 2 VIEW COMPARISON:  02/11/2016 FINDINGS: Cardiac shadow is within normal limits. No focal infiltrate or sizable effusion is seen. Bilateral breast implants are noted. No bony abnormality is seen. IMPRESSION: No active cardiopulmonary disease. Electronically Signed   By: Inez Catalina M.D.   On: 02/12/2021 23:50    ____________________________________________   PROCEDURES  Procedure(s) performed (including Critical Care):  Procedures   ____________________________________________   INITIAL IMPRESSION / ASSESSMENT AND PLAN / ED COURSE  As part of my medical decision making, I reviewed the following data within the electronic MEDICAL RECORD NUMBER {[id number]::"Notes from prior ED visits","[place] Controlled Substance Database"}        ***      ____________________________________________   FINAL CLINICAL IMPRESSION(S) / ED DIAGNOSES  Final diagnoses:  Herpes zoster without complication     ED Discharge Orders     None      *Please note:  Andrea Bernard was evaluated in Emergency Department on 02/13/2021 for the symptoms described in the history of present illness. She was evaluated in the context of the global COVID-19 pandemic, which necessitated consideration that the patient might be at risk for infection with the SARS-CoV-2 virus that causes COVID-19. Institutional protocols and algorithms that pertain to the evaluation of patients at risk for COVID-19 are in a state of rapid change based on information released by regulatory bodies including the CDC and federal and state organizations. These policies and algorithms were followed during the patient's care in the ED.  Some ED evaluations and interventions may be delayed as a result of limited staffing during and the pandemic.*   Note:  This document was prepared using Dragon voice recognition software and may include unintentional dictation errors.

## 2021-02-12 NOTE — ED Triage Notes (Signed)
Pt states 8 weeks ago she had breast augmentation and tummy tuck 2 weeks after procedure pt states she started hurting and started to have rash at her bra line, pt also has sharp shooting pain across chest and is dizzy

## 2021-02-12 NOTE — ED Provider Notes (Signed)
[facility] Emergency Department Provider Note   ____________________________________________   Event Date/Time   First MD Initiated Contact with Patient 02/12/21 2307     (approximate)  I have reviewed the triage vital signs and the nursing notes.   HISTORY  Chief Complaint Rash, chest pain, weakness   HPI Andrea Bernard is a 55 y.o. female who presents to the ED from home with a chief complaint of rash, chest pain and weakness.  Patient had breast augmentation 8 weeks ago.  Reports a 5-day history of rash under her left bra line radiating to her back.  Describes pain as sharp and associated with generalized weakness.  Has an appointment for breast ultrasound tomorrow and follow-up with her surgeon the following day.  Denies associated diaphoresis, palpitations, shortness of breath, nausea/vomiting.  Endorses mild dizziness.  Denies fever, cough, abdominal pain, dysuria or diarrhea.  Had lab work done as part of a routine physical 3 weeks ago at her PCP and UA done yesterday which was negative.  States she was exposed to someone with shingles last week.     Past Medical History:  Diagnosis Date  . Arthritis   . Dysplastic nevus 03/02/2008   Right lateral thigh. Moderate atypia, limited margins free.  [name] GERD (gastroesophageal reflux disease)   . Headache   . Iron deficiency anemia 06/15/2017  . Neurofibromatosis [facility])     Patient Active Problem List   Diagnosis Date Noted  . Respiratory infection 10/25/2020  . ANA positive 02/06/2020  . 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 Neurofibroma of foot 07/03/2019  . Migraine syndrome 07/03/2019  . Gastroesophageal reflux disease without esophagitis 07/03/2019  . Acute upper respiratory infection 06/16/2019  . Vaginal yeast infection 03/29/2019  . Fever 12/31/2018  . Cough 12/31/2018  . Unspecified menopausal and perimenopausal disorder 09/15/2018  . Fatigue 09/15/2018  . Neurofibromatosis  ([place]) 03/27/2018  . Intractable chronic migraine without aura and without status migrainosus 03/03/2018  . Acute non-recurrent maxillary sinusitis 02/10/2018  . Vasomotor rhinitis 02/10/2018  . Chronic pain syndrome 01/05/2018  . Long term current use of opiate analgesic 01/05/2018  . Pharmacologic therapy 01/05/2018  . Disorder of skeletal system 01/05/2018  . Problems influencing health status 01/05/2018  . Chronic neck pain (Primary Area of Pain)(right) 01/05/2018  . Chronic pain of both hips  [facility] Area of Pain) (L>R) 01/05/2018  . Chronic bilateral low back pain without sciatica  (Fourth Area of Pain)) 01/05/2018  . Chronic pain of right upper extremity (Secondary Area of Pain) 01/05/2018  . Encounter for general adult medical examination with abnormal findings 12/27/2017  . Abnormal weight gain 12/27/2017  . Menorrhagia with regular cycle 12/27/2017  . Dysuria 12/27/2017  . Urinary tract infectious disease 10/27/2017  . Iron deficiency anemia 06/15/2017  . History of neuroblastoma 06/15/2017  . Chronic pain 06/15/2017  . Angiolipomatosis, familial 02/26/2017  . Lipoma of left upper extremity 02/25/2017  . Multiple lipomas 02/25/2017  . 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 Pain of multiple extremities 07/13/2015  . 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 BREAST ENHANCEMENT SURGERY Bilateral 12/04/2020  . carpel tunnel release    . CESAREAN SECTION     x 4  . COLONOSCOPY WITH PROPOFOL N/A 09/04/2017   Procedure: COLONOSCOPY WITH PROPOFOL;  Surgeon: [name], MD;  Location: [facility] ENDOSCOPY;  Service: Gastroenterology;  Laterality: N/A;  . COLONOSCOPY WITH PROPOFOL N/A  12/21/2017   Procedure: COLONOSCOPY WITH PROPOFOL;  Surgeon: [name], MD;  Location[facility] ENDOSCOPY;  Service: Gastroenterology;  Laterality: N/A;  . ESOPHAGOGASTRODUODENOSCOPY (EGD) WITH PROPOFOL N/A 09/04/2017    Procedure: ESOPHAGOGASTRODUODENOSCOPY (EGD) WITH PROPOFOL;  Surgeon: [name], MD;  Location: [facility] ENDOSCOPY;  Service: Gastroenterology;  Laterality: N/A;  . HERNIA REPAIR    . HERNIA REPAIR  12/04/2020  . TUBAL LIGATION    . tummy tuck  12/04/2020   removed 20 masses on stomach  . TUMOR REMOVAL      Prior to Admission medications   Medication Sig Start Date End Date Taking? Authorizing Provider  lidocaine (XYLOCAINE) 2 % jelly Apply 1 application topically as needed. 02/13/21  Yes Paulette Blanch, MD  oxyCODONE-acetaminophen (PERCOCET/ROXICET) 5-325 MG tablet Take 1 tablet by mouth every 4 (four) hours as needed for severe pain. 02/13/21  Yes Paulette Blanch, MD  valACYclovir (VALTREX) 1000 MG tablet Take 1 tablet (1,000 mg total) by mouth 3 (three) times daily. 02/13/21  Yes Paulette Blanch, MD  acetaminophen (TYLENOL) 325 MG tablet Take 650 mg by mouth. 08/19/16   [provider]  albuterol (VENTOLIN HFA) 108 (90 Base) MCG/ACT inhaler TAKE 2 PUFFS BY MOUTH EVERY 6 HOURS AS NEEDED FOR WHEEZE OR SHORTNESS OF BREATH 12/13/20   [name], MD  cyclobenzaprine (FLEXERIL) 10 MG tablet Take 10 mg by mouth daily. 05/28/19   [provider]  gabapentin (NEURONTIN) 300 MG capsule Take 300 mg by mouth 3 (three) times daily. 01/29/16   [provider]  OXYCONTIN 80 MG 12 hr tablet Take 80 mg by mouth every 12 (twelve) hours. 01/29/16   [provider]  pantoprazole (PROTONIX) 40 MG tablet Take 1 tablet (40 mg total) by mouth 2 (two) times daily. 02/11/21   McDonough, Si Gaul, PA-C  SUMAtriptan (IMITREX) 100 MG tablet TAKE 1 TABLET BY MOUTH DAILY AS NEEDED FOR MIGRAINE. 02/11/21   McDonough, Lauren K, PA-C  tobramycin (TOBREX) 0.3 % ophthalmic solution APPLY 1 DROP TO TOENAILS AT BEDTIME 02/11/21   McDonough, Lauren K, PA-C    Allergies Nitrofuran derivatives  Family History  Problem Relation Age of Onset  . Anemia Mother   . Bladder Cancer Neg Hx   . Kidney cancer Neg  Hx   . Breast cancer Neg Hx   . Ovarian cancer Neg Hx   . Colon cancer Neg Hx     Social History Social History   Tobacco Use  . Smoking status: Never Smoker  . Smokeless tobacco: Never Used  Vaping Use  . Vaping Use: Never used  Substance Use Topics  . Alcohol use: No  . Drug use: Not Currently    Review of Systems  Constitutional: Positive for generalized weakness.  No fever/chills Eyes: No visual changes. ENT: No sore throat. Cardiovascular: Positive chest pain. Respiratory: Denies shortness of breath. Gastrointestinal: No abdominal pain.  No nausea, no vomiting.  No diarrhea.  No constipation. Genitourinary: Negative for dysuria. Musculoskeletal: Negative for back pain. Skin: Positive for rash. Neurological: Negative for headaches, focal weakness or numbness.   ____________________________________________   PHYSICAL EXAM:  VITAL SIGNS: ED Triage Vitals  Enc Vitals Group     BP 02/12/21 2209 (!) 147/93     Pulse Rate 02/12/21 2207 61     Resp 02/12/21 2207 18     Temp 02/12/21 2207 98.5 F (36.9 C)     Temp Source 02/12/21 2207 Oral     SpO2 02/12/21 2207 98 %  Weight 02/12/21 2207 170 lb 6.7 oz (77.3 kg)     Height 02/12/21 2207 5\' 4"  (1.626 m)     Head Circumference --      Peak Flow --      Pain Score 02/12/21 2206 10     Pain Loc --      Pain Edu? --      Excl. in [place]? --     Constitutional: Alert and oriented. Well appearing and in no acute distress. Eyes: Conjunctivae are normal. PERRL. EOMI. Head: Atraumatic. Nose: No congestion/rhinnorhea. Mouth/Throat: Mucous membranes are moist.   Neck: No stridor.   Cardiovascular: Normal rate, regular rhythm. Grossly normal heart sounds.  Good peripheral circulation. Respiratory: Normal respiratory effort.  No retractions. Lungs CTAB. Gastrointestinal: Soft and nontender to light or deep palpation. No distention. No abdominal bruits. No CVA tenderness. Musculoskeletal: No lower extremity tenderness  nor edema.  No joint effusions. Neurologic:  Normal speech and language. No gross focal neurologic deficits are appreciated. No gait instability. Skin:  Skin is warm, dry and intact.  Scattered vesicular rash noted to left breast and thoracic region in various stages of healing. Psychiatric: Mood and affect are normal. Speech and behavior are normal.  ____________________________________________   LABS (all labs ordered are listed, but only abnormal results are displayed)  Labs Reviewed  CBC WITH DIFFERENTIAL/PLATELET - Abnormal; Notable for the following components:      Result Value   Hemoglobin 10.6 (*)    HCT 33.3 (*)    All other components within normal limits  COMPREHENSIVE METABOLIC PANEL - Abnormal; Notable for the following components:   Glucose, Bld 112 (*)    Calcium 8.6 (*)    All other components within normal limits  TROPONIN I (HIGH SENSITIVITY)   ____________________________________________  EKG  ED ECG REPORT I, Denea Cheaney J, the attending physician, personally viewed and interpreted this ECG.   Date: 02/13/2021  EKG Time: 2351  Rate: 54  Rhythm: normal EKG, normal sinus rhythm  Axis: Normal  Intervals:none  ST&T Change: Nonspecific  ____________________________________________  RADIOLOGY I, Phung Kotas J, personally viewed and evaluated these images (plain radiographs) as part of my medical decision making, as well as reviewing the written report by the radiologist.  ED MD interpretation: No acute cardiopulmonary process  Official radiology report(s): DG Chest 2 View  Result Date: 02/12/2021 CLINICAL DATA:  History of recent breast augmentation and tummy tuck procedure with chest pain, initial encounter EXAM: CHEST - 2 VIEW COMPARISON:  02/11/2016 FINDINGS: Cardiac shadow is within normal limits. No focal infiltrate or sizable effusion is seen. Bilateral breast implants are noted. No bony abnormality is seen. IMPRESSION: No active cardiopulmonary  disease. Electronically Signed   By: Inez Catalina M.D.   On: 02/12/2021 23:50    ____________________________________________   PROCEDURES  Procedure(s) performed (including Critical Care):  .1-3 Lead EKG Interpretation Performed by: Paulette Blanch, MD Authorized by: Paulette Blanch, MD     Interpretation: normal     ECG rate:  54   ECG rate assessment: bradycardic     Rhythm: sinus bradycardia     Ectopy: none     Conduction: normal   Comments:     Patient placed on cardiac monitor to evaluate for arrhythmias     ____________________________________________   INITIAL IMPRESSION / ASSESSMENT AND PLAN / ED COURSE  As part of my medical decision making, I reviewed the following data within the [place] notes reviewed and incorporated, Labs reviewed,  EKG interpreted, Old chart reviewed, Radiograph reviewed and Notes from prior ED visits     55 year old female presenting with left-sided sharp chest pain with vesicular rash. Differential diagnosis includes, but is not limited to, ACS, aortic dissection, pulmonary embolism, cardiac tamponade, pneumothorax, pneumonia, pericarditis, myocarditis, GI-related causes including esophagitis/gastritis, and musculoskeletal chest wall pain.    Given patient's age, will obtain cardiac panel, chest x-ray.  Vesicular rash consistent with shingles to patient's left trunk.  Low suspicion for PE given patient is not tachycardic, tachypneic nor hypoxic.  Will start Valtrex, analgesia and reassess.  Clinical Course as of 02/13/21 0120  Wed Feb 13, 2021  0058 Updated patient on all test results.  She is already taking gabapentin as part of her pain regimen.  Will prescribe Percocet for breakthrough pain, Valtrex, lidocaine jelly.  Strict return precautions given.  Patient verbalizes understanding and agrees with plan of care. [JS]    Clinical Course User Index [JS] Paulette Blanch, MD      ____________________________________________   FINAL CLINICAL IMPRESSION(S) / ED DIAGNOSES  Final diagnoses:  Herpes zoster without complication  Weakness generalized     ED Discharge Orders         Ordered    valACYclovir (VALTREX) 1000 MG tablet  3 times daily        02/13/21 0100    oxyCODONE-acetaminophen (PERCOCET/ROXICET) 5-325 MG tablet  Every 4 hours PRN        02/13/21 0100    lidocaine (XYLOCAINE) 2 % jelly  As needed        02/13/21 0100          *Please note:  Andrea Bernard was evaluated in Emergency Department on 02/13/2021 for the symptoms described in the history of present illness. She was evaluated in the context of the global COVID-19 pandemic, which necessitated consideration that the patient might be at risk for infection with the SARS-CoV-2 virus that causes COVID-19. Institutional protocols and algorithms that pertain to the evaluation of patients at risk for COVID-19 are in a state of rapid change based on information released by regulatory bodies including the CDC and federal and state organizations. These policies and algorithms were followed during the patient's care in the ED.  Some ED evaluations and interventions may be delayed as a result of limited staffing during and the pandemic.*   Note:  This document was prepared using Dragon voice recognition software and may include unintentional dictation errors.   Paulette Blanch, MD 02/13/21 [phone]

## 2021-02-12 NOTE — ED Notes (Signed)
Pt reports she is concerned she has shingles due to blisters near bra line. Pt states she was exposed a few days ago. Pt reports painful blisters and pain to left breast. Pt awaiting MD.

## 2021-02-13 ENCOUNTER — Ambulatory Visit: Payer: BC Managed Care – PPO

## 2021-02-13 ENCOUNTER — Other Ambulatory Visit: Payer: Self-pay | Admitting: Plastic Surgery

## 2021-02-13 ENCOUNTER — Ambulatory Visit
Admission: RE | Admit: 2021-02-13 | Discharge: 2021-02-13 | Disposition: A | Discharge status: Home / Self Care | Payer: BC Managed Care – PPO | Source: Ambulatory Visit | Attending: Plastic Surgery | Admitting: Plastic Surgery

## 2021-02-13 DIAGNOSIS — Z411 Encounter for cosmetic surgery: Secondary | ICD-10-CM

## 2021-02-13 LAB — COMPREHENSIVE METABOLIC PANEL
ALT: 16 U/L (ref 0–44)
AST: 34 U/L (ref 15–41)
Albumin: 3.7 g/dL (ref 3.5–5.0)
Alkaline Phosphatase: 64 U/L (ref 38–126)
Anion gap: 9 (ref 5–15)
BUN: 16 mg/dL (ref 6–20)
CO2: 23 mmol/L (ref 22–32)
Calcium: 8.6 mg/dL — ABNORMAL LOW (ref 8.9–10.3)
Chloride: 107 mmol/L (ref 98–111)
Creatinine, Ser: 0.89 mg/dL (ref 0.44–1.00)
GFR, Estimated: >60 mL/min (ref >60)
Glucose, Bld: 112 mg/dL — ABNORMAL HIGH (ref 70–99)
Potassium: 4.1 mmol/L (ref 3.5–5.1)
Sodium: 139 mmol/L (ref 135–145)
Total Bilirubin: 0.7 mg/dL (ref 0.3–1.2)
Total Protein: 6.5 g/dL (ref 6.5–8.1)

## 2021-02-13 LAB — CBC WITH DIFFERENTIAL/PLATELET
Abs Immature Granulocytes: 0.03 10*3/uL (ref 0.00–0.07)
Basophils Absolute: 0.1 10*3/uL (ref 0.0–0.1)
Basophils Relative: 2 %
Eosinophils Absolute: 0.5 10*3/uL (ref 0.0–0.5)
Eosinophils Relative: 8 %
HCT: 33.3 % — ABNORMAL LOW (ref 36.0–46.0)
Hemoglobin: 10.6 g/dL — ABNORMAL LOW (ref 12.0–15.0)
Immature Granulocytes: 1 %
Lymphocytes Relative: 33 %
Lymphs Abs: 2 10*3/uL (ref 0.7–4.0)
MCH: 26.8 pg (ref 26.0–34.0)
MCHC: 31.8 g/dL (ref 30.0–36.0)
MCV: 84.1 fL (ref 80.0–100.0)
Monocytes Absolute: 0.7 10*3/uL (ref 0.1–1.0)
Monocytes Relative: 11 %
Neutro Abs: 2.8 10*3/uL (ref 1.7–7.7)
Neutrophils Relative %: 45 %
Platelets: 199 10*3/uL (ref 150–400)
RBC: 3.96 MIL/uL (ref 3.87–5.11)
RDW: 12.6 % (ref 11.5–15.5)
WBC: 6 10*3/uL (ref 4.0–10.5)
nRBC: 0 % (ref 0.0–0.2)

## 2021-02-13 LAB — TROPONIN I (HIGH SENSITIVITY): Troponin I (High Sensitivity): 3 ng/L (ref <18)

## 2021-02-13 MED ORDER — VALACYCLOVIR HCL 1 G PO TABS: 1000.0000 mg | ORAL_TABLET | Freq: Three times a day (TID) | ORAL | 0 refills | Status: DC

## 2021-02-13 MED ORDER — OXYCODONE-ACETAMINOPHEN 5-325 MG PO TABS: 1.0000 | ORAL_TABLET | ORAL | 0 refills | Status: DC | PRN

## 2021-02-13 MED ORDER — LIDOCAINE HCL URETHRAL/MUCOSAL 2 % EX GEL: 1.0000 "application " | CUTANEOUS | 0 refills | Status: DC | PRN

## 2021-02-13 NOTE — Discharge Instructions (Addendum)
1.  Take Valtrex 1g 3 times daily until finished. 2.  You may take Percocet as needed for breakthrough pain. 3.  You may apply Lidocaine jelly to affected areas 3 times daily as needed for pain. 4.  Return to the ER for worsening symptoms, persistent vomiting, difficulty breathing or other concerns.

## 2021-02-14 ENCOUNTER — Other Ambulatory Visit: Payer: Self-pay

## 2021-02-14 ENCOUNTER — Ambulatory Visit (INDEPENDENT_AMBULATORY_CARE_PROVIDER_SITE_OTHER): Payer: BC Managed Care – PPO | Admitting: Plastic Surgery

## 2021-02-14 DIAGNOSIS — Z411 Encounter for cosmetic surgery: Secondary | ICD-10-CM

## 2021-02-14 DIAGNOSIS — N644 Mastodynia: Secondary | ICD-10-CM

## 2021-02-14 NOTE — Progress Notes (Signed)
Patient presents 2 months out from bilateral breast augmentation and abdominoplasty.  She is overall very happy with her result but has been experiencing some very sharp unusual pain in the left chest area.  Over the past week or so she developed a few erythematous lesions extending on the breast and then along towards her axilla and onto her back.  She was seen in the emergency room for this and diagnosed with shingles.  This would explain quite a bit and then is almost certainly the reason for the skin sensitivity that she has been experiencing.  On exam her result looks great with no swelling or asymmetry and no erythema that would be suggestive of an underlying infection.  Her abdominoplasty result is also quite good.  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  These are painful for her and she has had numerous removed removals of these type of lesions in the past.  She did not end up getting her ultrasound due to the shingles diagnosis but she still wants to get it in the future and I think that is fine.  We will plan to send her for the ultrasound just to be on the safe side and then when I see her back I can remove a couple of the abdominal wall lipomas that are each 2 to 3 cm in size.  All of her questions were answered and we will plan to see your in another 4 to 6 weeks

## 2021-03-08 ENCOUNTER — Other Ambulatory Visit: Payer: Self-pay | Admitting: Plastic Surgery

## 2021-03-08 ENCOUNTER — Other Ambulatory Visit: Payer: Self-pay

## 2021-03-08 ENCOUNTER — Ambulatory Visit
Admission: RE | Admit: 2021-03-08 | Discharge: 2021-03-08 | Disposition: A | Discharge status: Home / Self Care | Payer: BC Managed Care – PPO | Source: Ambulatory Visit | Attending: Plastic Surgery | Admitting: Plastic Surgery

## 2021-03-08 DIAGNOSIS — N644 Mastodynia: Secondary | ICD-10-CM

## 2021-03-12 ENCOUNTER — Telehealth: Payer: Self-pay

## 2021-03-12 ENCOUNTER — Inpatient Hospital Stay: Admission: RE | Admit: 2021-03-12 | Payer: BC Managed Care – PPO | Source: Ambulatory Visit

## 2021-03-12 NOTE — Telephone Encounter (Signed)
Patient called last week and spoke to [facility] about scheduling a surgery for a lipoma. I have made several attempts to call this patient to confirm surgery is needed for the right hip that's previously documented in other office notes. The phone just rings with no voicemail.

## 2021-03-14 ENCOUNTER — Ambulatory Visit: Payer: BC Managed Care – PPO | Admitting: Plastic Surgery

## 2021-03-14 ENCOUNTER — Other Ambulatory Visit: Payer: Self-pay | Admitting: Nurse Practitioner

## 2021-05-12 ENCOUNTER — Other Ambulatory Visit: Payer: Self-pay | Admitting: Nurse Practitioner

## 2021-05-13 ENCOUNTER — Other Ambulatory Visit: Payer: Self-pay | Admitting: Physician Assistant

## 2021-05-13 ENCOUNTER — Other Ambulatory Visit: Payer: Self-pay | Admitting: Nurse Practitioner

## 2021-05-13 DIAGNOSIS — Z0001 Encounter for general adult medical examination with abnormal findings: Secondary | ICD-10-CM

## 2021-05-13 DIAGNOSIS — L601 Onycholysis: Secondary | ICD-10-CM

## 2021-05-13 DIAGNOSIS — G43909 Migraine, unspecified, not intractable, without status migrainosus: Secondary | ICD-10-CM

## 2021-05-13 DIAGNOSIS — D509 Iron deficiency anemia, unspecified: Secondary | ICD-10-CM

## 2021-05-13 DIAGNOSIS — N644 Mastodynia: Secondary | ICD-10-CM

## 2021-05-13 DIAGNOSIS — K219 Gastro-esophageal reflux disease without esophagitis: Secondary | ICD-10-CM

## 2021-05-13 DIAGNOSIS — R300 Dysuria: Secondary | ICD-10-CM

## 2021-05-13 DIAGNOSIS — Q8500 Neurofibromatosis, unspecified: Secondary | ICD-10-CM

## 2021-05-13 DIAGNOSIS — G894 Chronic pain syndrome: Secondary | ICD-10-CM

## 2021-05-13 DIAGNOSIS — E7800 Pure hypercholesterolemia, unspecified: Secondary | ICD-10-CM

## 2021-05-13 DIAGNOSIS — Z9882 Breast implant status: Secondary | ICD-10-CM

## 2021-05-14 ENCOUNTER — Encounter: Payer: Self-pay | Admitting: Dermatology

## 2021-05-14 ENCOUNTER — Other Ambulatory Visit: Payer: Self-pay

## 2021-05-14 ENCOUNTER — Ambulatory Visit (INDEPENDENT_AMBULATORY_CARE_PROVIDER_SITE_OTHER): Payer: BC Managed Care – PPO | Admitting: Dermatology

## 2021-05-14 ENCOUNTER — Telehealth: Payer: Self-pay

## 2021-05-14 DIAGNOSIS — D2372 Other benign neoplasm of skin of left lower limb, including hip: Secondary | ICD-10-CM

## 2021-05-14 DIAGNOSIS — D485 Neoplasm of uncertain behavior of skin: Secondary | ICD-10-CM | POA: Diagnosis not present

## 2021-05-14 DIAGNOSIS — D492 Neoplasm of unspecified behavior of bone, soft tissue, and skin: Secondary | ICD-10-CM

## 2021-05-14 DIAGNOSIS — D1739 Benign lipomatous neoplasm of skin and subcutaneous tissue of other sites: Secondary | ICD-10-CM

## 2021-05-14 MED ORDER — MUPIROCIN 2 % EX OINT: 1.0000 "application " | TOPICAL_OINTMENT | Freq: Every day | CUTANEOUS | 1 refills | Status: DC

## 2021-05-14 MED ORDER — DOXYCYCLINE HYCLATE 100 MG PO TABS: 100.0000 mg | ORAL_TABLET | Freq: Two times a day (BID) | ORAL | 0 refills | Status: AC

## 2021-05-14 NOTE — Progress Notes (Signed)
Follow-Up Visit   Subjective  Andrea Bernard is a 55 y.o. female who presents for the following: Lipoma vs other (L hip / thigh x 3, pt presents for excision).  Each of these has been growing and symptomatic and painful.  The following portions of the chart were reviewed this encounter and updated as appropriate:   Tobacco  Allergies  Meds  Problems  Med Hx  Surg Hx  Fam Hx     Review of Systems:  No other skin or systemic complaints except as noted in HPI or Assessment and Plan.  Objective  Well appearing patient in no apparent distress; mood and affect are within normal limits.  A focused examination was performed including left hip/ thigh. Relevant physical exam findings are noted in the Assessment and Plan.  Left Hip / thigh Superior Rubbery nodule 6.0cm  Left hip/ thigh inferior 2.5cm rubbery nodule   Left hip / thigh anterior 3.1cm rubbery nodule   Assessment & Plan  Neoplasm of skin (3) Left Hip/ thigh Superior  mupirocin ointment (BACTROBAN) 2 % Apply 1 application topically daily. Qd to excision site  Skin excision  Lesion length (cm):  6 Lesion width (cm):  6 Margin per side (cm):  0 Total excision diameter (cm):  6 Informed consent: discussed and consent obtained   Timeout: patient name, date of birth, surgical site, and procedure verified   Procedure prep:  Patient was prepped and draped in usual sterile fashion Prep type:  Isopropyl alcohol and povidone-iodine Anesthesia: the lesion was anesthetized in a standard fashion   Anesthetic:  1% lidocaine w/ epinephrine 1-100,000 buffered w/ 8.4% NaHCO3 (7.0cc lido with epi and bupivicaine) Instrument used: #15 blade   Hemostasis achieved with: pressure   Hemostasis achieved with comment:  Electrocautery Outcome: patient tolerated procedure well with no complications   Post-procedure details: sterile dressing applied and wound care instructions given   Dressing type: bandage and bacitracin  (Mupirocin)    Skin repair Complexity:  Complex Final length (cm):  2.5 Reason for type of repair: reduce tension to allow closure, reduce the risk of dehiscence, infection, and necrosis, reduce subcutaneous dead space and avoid a hematoma, allow closure of the large defect, preserve normal anatomy, preserve normal anatomical and functional relationships and enhance both functionality and cosmetic results   Undermining: area extensively undermined   Undermining comment:  Undermining Defect 6.0cm Subcutaneous layers (deep stitches):  Suture size:  2-0 Suture type: Vicryl (polyglactin 910)   Subcutaneous suture technique: Inverted Dermal. Fine/surface layer approximation (top stitches):  Suture size:  3-0 Suture type: nylon   Stitches: horizontal mattress   Suture removal (days):  7 Hemostasis achieved with: pressure Outcome: patient tolerated procedure well with no complications   Post-procedure details: sterile dressing applied and wound care instructions given   Dressing type: bandage, pressure dressing and bacitracin (Mupirocin)    Specimen 1 - Surgical pathology Differential Diagnosis: D48.5 Lipoma vs other  Check Margins: No Rubbery nodule  Left hip/ thigh inferior  Skin excision  Lesion length (cm):  2.5 Lesion width (cm):  2.5 Margin per side (cm):  0 Total excision diameter (cm):  2.5 Informed consent: discussed and consent obtained   Timeout: patient name, date of birth, surgical site, and procedure verified   Procedure prep:  Patient was prepped and draped in usual sterile fashion Prep type:  Isopropyl alcohol and povidone-iodine Anesthesia: the lesion was anesthetized in a standard fashion   Anesthetic:  1% lidocaine w/ epinephrine 1-100,000 buffered  w/ 8.4% NaHCO3 (7.0cc lido with epi and bupivicaine) Instrument used: #15 blade   Hemostasis achieved with: pressure   Hemostasis achieved with comment:  Electrocautery Outcome: patient tolerated procedure well with  no complications   Post-procedure details: sterile dressing applied and wound care instructions given   Dressing type: bandage, pressure dressing and bacitracin (Mupirocin)    Skin repair Complexity:  Complex Final length (cm):  1 Reason for type of repair: reduce tension to allow closure, reduce the risk of dehiscence, infection, and necrosis, reduce subcutaneous dead space and avoid a hematoma, allow closure of the large defect, preserve normal anatomy, preserve normal anatomical and functional relationships and enhance both functionality and cosmetic results   Undermining: area extensively undermined   Undermining comment:  Undermining Defect 2.5cm Subcutaneous layers (deep stitches):  Suture size:  2-0 Suture type: Vicryl (polyglactin 910)   Subcutaneous suture technique: Inverted Dermal. Fine/surface layer approximation (top stitches):  Suture size:  3-0 Suture type: nylon   Stitches: horizontal mattress   Suture removal (days):  7 Hemostasis achieved with: pressure Outcome: patient tolerated procedure well with no complications   Post-procedure details: sterile dressing applied and wound care instructions given   Dressing type: bandage, pressure dressing and bacitracin (Mupirocin)    Specimen 2 - Surgical pathology Differential Diagnosis: D48.5 Lipoma vs other  Check Margins: No 2.5cm rubbery nodule  Left hip / thigh anterior  Skin excision  Lesion length (cm):  3.1 Lesion width (cm):  3.1 Margin per side (cm):  0 Total excision diameter (cm):  3.1 Informed consent: discussed and consent obtained   Timeout: patient name, date of birth, surgical site, and procedure verified   Procedure prep:  Patient was prepped and draped in usual sterile fashion Prep type:  Isopropyl alcohol and povidone-iodine Anesthesia: the lesion was anesthetized in a standard fashion   Anesthetic:  1% lidocaine w/ epinephrine 1-100,000 buffered w/ 8.4% NaHCO3 (7.0cc Lidocaine w/ epi and  bupivicaine) Instrument used: #15 blade   Hemostasis achieved with: pressure   Hemostasis achieved with comment:  Electrocautery Outcome: patient tolerated procedure well with no complications   Post-procedure details: sterile dressing applied and wound care instructions given   Dressing type: bandage, pressure dressing and bacitracin (Mupirocin)    Skin repair Complexity:  Complex Final length (cm):  1 Reason for type of repair: reduce tension to allow closure, reduce the risk of dehiscence, infection, and necrosis, reduce subcutaneous dead space and avoid a hematoma, allow closure of the large defect, preserve normal anatomy, preserve normal anatomical and functional relationships and enhance both functionality and cosmetic results   Undermining: area extensively undermined   Undermining comment:  Undermining Defect 3.1cm Subcutaneous layers (deep stitches):  Suture size:  2-0 Suture type: Vicryl (polyglactin 910)   Subcutaneous suture technique: Inverted Dermal. Fine/surface layer approximation (top stitches):  Suture size:  3-0 Suture type: nylon   Stitches: horizontal mattress   Suture removal (days):  7 Hemostasis achieved with: pressure Outcome: patient tolerated procedure well with no complications   Post-procedure details: sterile dressing applied and wound care instructions given   Dressing type: bandage, pressure dressing and bacitracin (Mupirocin)    Specimen 3 - Surgical pathology Differential Diagnosis: D48.5 Lipoma vs other  Check Margins: No 3.1cm rubbery nodule  Lipoma vs other x 3  Start Mupirocin oint qd to excision site  Start Doxycycline '100mg'$  1 po bid for 7 days, with food and drink.  Doxycycline should be taken with food to prevent nausea. Do not lay  down for 30 minutes after taking. Be cautious with sun exposure and use good sun protection while on this medication. Pregnant women should not take this medication.    Related Medications doxycycline  (VIBRA-TABS) 100 MG tablet Take 1 tablet (100 mg total) by mouth 2 (two) times daily for 7 days. With food and plenty of fluid  Return in about 1 week (around 05/21/2021) for suture removal.  I, Othelia Pulling, RMA, am acting as scribe for Sarina Ser, MD . Documentation: I have reviewed the above documentation for accuracy and completeness, and I agree with the above.  Sarina Ser, MD

## 2021-05-14 NOTE — Patient Instructions (Signed)
Wound Care Instructions  Cleanse wound gently with soap and water once a day then pat dry with clean gauze. Apply a thing coat of Petrolatum (petroleum jelly, "Vaseline") over the wound (unless you have an allergy to this). We recommend that you use a new, sterile tube of Vaseline. Do not pick or remove scabs. Do not remove the yellow or white "healing tissue" from the base of the wound.  Cover the wound with fresh, clean, nonstick gauze and secure with paper tape. You may use Band-Aids in place of gauze and tape if the would is small enough, but would recommend trimming much of the tape off as there is often too much. Sometimes Band-Aids can irritate the skin.  You should call the office for your biopsy report after 1 week if you have not already been contacted.  If you experience any problems, such as abnormal amounts of bleeding, swelling, significant bruising, significant pain, or evidence of infection, please call the office immediately.  FOR ADULT SURGERY PATIENTS: If you need something for pain relief you may take 1 extra strength Tylenol (acetaminophen) AND 2 Ibuprofen (200mg each) together every 4 hours as needed for pain. (do not take these if you are allergic to them or if you have a reason you should not take them.) Typically, you may only need pain medication for 1 to 3 days.   If you have any questions or concerns for your doctor, please call our main line at [phone] and press option 4 to reach your doctor's medical assistant. If no one answers, please leave a voicemail as directed and we will return your call as soon as possible. Messages left after 4 pm will be answered the following business day.   You may also send us a message via MyChart. We typically respond to MyChart messages within 1-2 business days.  For prescription refills, please ask your pharmacy to contact our office. Our fax number is [phone].  If you have an urgent issue when the clinic is closed that  cannot wait until the next business day, you can page your doctor at the number below.    Please note that while we do our best to be available for urgent issues outside of office hours, we are not available 24/7.   If you have an urgent issue and are unable to reach us, you may choose to seek medical care at your doctor's office, retail clinic, urgent care center, or emergency room.  If you have a medical emergency, please immediately call 911 or go to the emergency department.  Pager Numbers  - Dr. Kowalski: [phone]  - Dr. Moye: [phone]  - Dr. Stewart: [phone]  In the event of inclement weather, please call our main line at [phone] for an update on the status of any delays or closures.  Dermatology Medication Tips: Please keep the boxes that topical medications come in in order to help keep track of the instructions about where and how to use these. Pharmacies typically print the medication instructions only on the boxes and not directly on the medication tubes.   If your medication is too expensive, please contact our office at [phone] option 4 or send us a message through MyChart.   We are unable to tell what your co-pay for medications will be in advance as this is different depending on your insurance coverage. However, we may be able to find a substitute medication at lower cost or fill out paperwork to get insurance to cover a needed   medication.   If a prior authorization is required to get your medication covered by your insurance company, please allow us 1-2 business days to complete this process.  Drug prices often vary depending on where the prescription is filled and some pharmacies may offer cheaper prices.  The website www.goodrx.com contains coupons for medications through different pharmacies. The prices here do not account for what the cost may be with help from insurance (it may be cheaper with your insurance), but the website can give you the  price if you did not use any insurance.  - You can print the associated coupon and take it with your prescription to the pharmacy.  - You may also stop by our office during regular business hours and pick up a GoodRx coupon card.  - If you need your prescription sent electronically to a different pharmacy, notify our office through [facility] MyChart or by phone at [phone] option 4.   

## 2021-05-14 NOTE — Telephone Encounter (Signed)
Patient doing fine after today's surgery./sh 

## 2021-05-15 ENCOUNTER — Other Ambulatory Visit: Payer: Self-pay | Admitting: Nurse Practitioner

## 2021-05-21 ENCOUNTER — Other Ambulatory Visit: Payer: Self-pay

## 2021-05-21 ENCOUNTER — Ambulatory Visit (INDEPENDENT_AMBULATORY_CARE_PROVIDER_SITE_OTHER): Payer: BC Managed Care – PPO | Admitting: Dermatology

## 2021-05-21 DIAGNOSIS — D1724 Benign lipomatous neoplasm of skin and subcutaneous tissue of left leg: Secondary | ICD-10-CM

## 2021-05-21 DIAGNOSIS — D1739 Benign lipomatous neoplasm of skin and subcutaneous tissue of other sites: Secondary | ICD-10-CM

## 2021-05-21 NOTE — Progress Notes (Signed)
   Follow-Up Visit   Subjective  Andrea Bernard is a 55 y.o. female who presents for the following: suture removals (Of pathology proven lipomas of the L hip superior, L hip inferior, and L hip anterior - patient is here today for suture removal.).  The following portions of the chart were reviewed this encounter and updated as appropriate:   Tobacco  Allergies  Meds  Problems  Med Hx  Surg Hx  Fam Hx     Review of Systems:  No other skin or systemic complaints except as noted in HPI or Assessment and Plan.  Objective  Well appearing patient in no apparent distress; mood and affect are within normal limits.  A focused examination was performed including the legs. Relevant physical exam findings are noted in the Assessment and Plan.  L hip superior, L hip inferior, L hip anterior Healing excision sites.   Assessment & Plan  Lipoma of left lower extremity L hip superior, L hip inferior, L hip anterior  Encounter for Removal of Sutures - Incision site at the L hip superior, L hip inferior, and L hip anterior is clean, dry and intact - Wound cleansed, sutures removed, wound cleansed and steri strips applied.  - Discussed pathology results showing benign lipomas.  - Patient advised to keep steri-strips dry until they fall off. - Scars remodel for a full year. - Once steri-strips fall off, patient can apply over-the-counter silicone scar cream each night to help with scar remodeling if desired. - Patient advised to call with any concerns or if they notice any new or changing lesions.   Return for appointment as scheduled.  Luther Redo, CMA, am acting as scribe for Sarina Ser, MD . Documentation: I have reviewed the above documentation for accuracy and completeness, and I agree with the above.  Sarina Ser, MD

## 2021-05-21 NOTE — Patient Instructions (Signed)

## 2021-05-22 ENCOUNTER — Ambulatory Visit: Payer: BC Managed Care – PPO | Admitting: Plastic Surgery

## 2021-05-26 ENCOUNTER — Encounter: Payer: Self-pay | Admitting: Dermatology

## 2021-05-30 ENCOUNTER — Ambulatory Visit: Payer: BC Managed Care – PPO | Admitting: Physician Assistant

## 2021-05-31 ENCOUNTER — Ambulatory Visit: Payer: BC Managed Care – PPO | Admitting: Physician Assistant

## 2021-06-18 ENCOUNTER — Telehealth: Payer: Self-pay

## 2021-06-18 ENCOUNTER — Other Ambulatory Visit: Payer: Self-pay

## 2021-06-18 ENCOUNTER — Ambulatory Visit (INDEPENDENT_AMBULATORY_CARE_PROVIDER_SITE_OTHER): Payer: BC Managed Care – PPO | Admitting: Dermatology

## 2021-06-18 DIAGNOSIS — D492 Neoplasm of unspecified behavior of bone, soft tissue, and skin: Secondary | ICD-10-CM

## 2021-06-18 DIAGNOSIS — D485 Neoplasm of uncertain behavior of skin: Secondary | ICD-10-CM | POA: Diagnosis not present

## 2021-06-18 DIAGNOSIS — D1723 Benign lipomatous neoplasm of skin and subcutaneous tissue of right leg: Secondary | ICD-10-CM

## 2021-06-18 MED ORDER — MUPIROCIN 2 % EX OINT: 1.0000 "application " | TOPICAL_OINTMENT | Freq: Every day | CUTANEOUS | 1 refills | Status: DC

## 2021-06-18 NOTE — Telephone Encounter (Signed)
Tried to call pt post op to see how she was doing and didn't get an answer or vm./sh

## 2021-06-18 NOTE — Patient Instructions (Signed)
Wound Care Instructions  Cleanse wound gently with soap and water once a day then pat dry with clean gauze. Apply a thing coat of Petrolatum (petroleum jelly, "Vaseline") over the wound (unless you have an allergy to this). We recommend that you use a new, sterile tube of Vaseline. Do not pick or remove scabs. Do not remove the yellow or white "healing tissue" from the base of the wound.  Cover the wound with fresh, clean, nonstick gauze and secure with paper tape. You may use Band-Aids in place of gauze and tape if the would is small enough, but would recommend trimming much of the tape off as there is often too much. Sometimes Band-Aids can irritate the skin.  You should call the office for your biopsy report after 1 week if you have not already been contacted.  If you experience any problems, such as abnormal amounts of bleeding, swelling, significant bruising, significant pain, or evidence of infection, please call the office immediately.  FOR ADULT SURGERY PATIENTS: If you need something for pain relief you may take 1 extra strength Tylenol (acetaminophen) AND 2 Ibuprofen (200mg each) together every 4 hours as needed for pain. (do not take these if you are allergic to them or if you have a reason you should not take them.) Typically, you may only need pain medication for 1 to 3 days.   If you have any questions or concerns for your doctor, please call our main line at [phone] and press option 4 to reach your doctor's medical assistant. If no one answers, please leave a voicemail as directed and we will return your call as soon as possible. Messages left after 4 pm will be answered the following business day.   You may also send us a message via MyChart. We typically respond to MyChart messages within 1-2 business days.  For prescription refills, please ask your pharmacy to contact our office. Our fax number is [phone].  If you have an urgent issue when the clinic is closed that  cannot wait until the next business day, you can page your doctor at the number below.    Please note that while we do our best to be available for urgent issues outside of office hours, we are not available 24/7.   If you have an urgent issue and are unable to reach us, you may choose to seek medical care at your doctor's office, retail clinic, urgent care center, or emergency room.  If you have a medical emergency, please immediately call 911 or go to the emergency department.  Pager Numbers  - Dr. Kowalski: [phone]  - Dr. Moye: [phone]  - Dr. Stewart: [phone]  In the event of inclement weather, please call our main line at [phone] for an update on the status of any delays or closures.  Dermatology Medication Tips: Please keep the boxes that topical medications come in in order to help keep track of the instructions about where and how to use these. Pharmacies typically print the medication instructions only on the boxes and not directly on the medication tubes.   If your medication is too expensive, please contact our office at [phone] option 4 or send us a message through MyChart.   We are unable to tell what your co-pay for medications will be in advance as this is different depending on your insurance coverage. However, we may be able to find a substitute medication at lower cost or fill out paperwork to get insurance to cover a needed   medication.   If a prior authorization is required to get your medication covered by your insurance company, please allow us 1-2 business days to complete this process.  Drug prices often vary depending on where the prescription is filled and some pharmacies may offer cheaper prices.  The website www.goodrx.com contains coupons for medications through different pharmacies. The prices here do not account for what the cost may be with help from insurance (it may be cheaper with your insurance), but the website can give you the  price if you did not use any insurance.  - You can print the associated coupon and take it with your prescription to the pharmacy.  - You may also stop by our office during regular business hours and pick up a GoodRx coupon card.  - If you need your prescription sent electronically to a different pharmacy, notify our office through [facility] MyChart or by phone at [phone] option 4.   

## 2021-06-18 NOTE — Progress Notes (Signed)
Follow-Up Visit   Subjective  Andrea Bernard is a 55 y.o. female who presents for the following: Lipomas vs other (R ant thigh, pt presents for excisions today).  The following portions of the chart were reviewed this encounter and updated as appropriate:   Tobacco  Allergies  Meds  Problems  Med Hx  Surg Hx  Fam Hx     Review of Systems:  No other skin or systemic complaints except as noted in HPI or Assessment and Plan.  Objective  Well appearing patient in no apparent distress; mood and affect are within normal limits.  A focused examination was performed including R ant thigh. Relevant physical exam findings are noted in the Assessment and Plan.  Right ant thigh distal superior Rubbery nodule 2.1cm  Right ant thigh distal medial Rubbery nodule 2.1cm  Right ant distal thigh lateral Rubbery nodule 3.1cm  Right Thigh - Anterior Multiple rubbery nodules   Assessment & Plan  Neoplasm of skin (3) Right ant thigh distal superior  mupirocin ointment (BACTROBAN) 2 % Apply 1 application topically daily. Qd to excision site  Skin excision  Lesion length (cm):  2.1 Lesion width (cm):  2.1 Margin per side (cm):  0 Total excision diameter (cm):  2.1 Informed consent: discussed and consent obtained   Timeout: patient name, date of birth, surgical site, and procedure verified   Procedure prep:  Patient was prepped and draped in usual sterile fashion Prep type:  Isopropyl alcohol and povidone-iodine Anesthesia: the lesion was anesthetized in a standard fashion   Anesthetic:  1% lidocaine w/ epinephrine 1-100,000 buffered w/ 8.4% NaHCO3 (3cc lido w/ epi, 1cc bupivicaine, Total of 4cc) Instrument used: #15 blade   Hemostasis achieved with: pressure   Hemostasis achieved with comment:  Electrocautery Outcome: patient tolerated procedure well with no complications   Post-procedure details: sterile dressing applied and wound care instructions given   Dressing type:  bandage, pressure dressing and bacitracin (Mupirocin)    Skin repair Complexity:  Complex Final length (cm):  1 Reason for type of repair: reduce tension to allow closure, reduce the risk of dehiscence, infection, and necrosis, reduce subcutaneous dead space and avoid a hematoma, allow closure of the large defect, preserve normal anatomy, preserve normal anatomical and functional relationships and enhance both functionality and cosmetic results   Undermining: area extensively undermined   Undermining comment:  Undermining Defect 2.1cm Subcutaneous layers (deep stitches):  Suture size:  3-0 Suture type: Vicryl (polyglactin 910)   Subcutaneous suture technique: Inverted Dermal. Fine/surface layer approximation (top stitches):  Suture type: nylon   Stitches: horizontal mattress   Suture removal (days):  7 Hemostasis achieved with: pressure Outcome: patient tolerated procedure well with no complications   Post-procedure details: sterile dressing applied and wound care instructions given   Dressing type: bandage, pressure dressing and bacitracin (Mupirocin)    Specimen 1 - Surgical pathology Differential Diagnosis: D48.5 Lipoma vs other  Check Margins: No Rubbery nodule 2.1cm  Right ant thigh distal medial  Skin excision  Lesion length (cm):  2.1 Lesion width (cm):  2.1 Margin per side (cm):  0 Total excision diameter (cm):  2.1 Informed consent: discussed and consent obtained   Timeout: patient name, date of birth, surgical site, and procedure verified   Procedure prep:  Patient was prepped and draped in usual sterile fashion Prep type:  Isopropyl alcohol and povidone-iodine Anesthesia: the lesion was anesthetized in a standard fashion   Anesthetic:  1% lidocaine w/ epinephrine 1-100,000 buffered w/  8.4% NaHCO3 (3cc lido w/ epi, 1cc bupivicaine, Total of 4cc) Instrument used: #15 blade   Hemostasis achieved with: pressure   Hemostasis achieved with comment:   Electrocautery Outcome: patient tolerated procedure well with no complications   Post-procedure details: sterile dressing applied and wound care instructions given   Dressing type: bandage, pressure dressing and bacitracin (Mupirocin)    Skin repair Complexity:  Complex Final length (cm):  1 Reason for type of repair: reduce tension to allow closure, reduce the risk of dehiscence, infection, and necrosis, reduce subcutaneous dead space and avoid a hematoma, allow closure of the large defect, preserve normal anatomy, preserve normal anatomical and functional relationships and enhance both functionality and cosmetic results   Undermining: area extensively undermined   Undermining comment:  Undermining Defect 2.1cm Subcutaneous layers (deep stitches):  Suture size:  3-0 Suture type: Vicryl (polyglactin 910)   Subcutaneous suture technique: Inverted Dermal. Fine/surface layer approximation (top stitches):  Suture size:  3-0 Suture type: nylon   Stitches: horizontal mattress   Suture removal (days):  7 Hemostasis achieved with: pressure Outcome: patient tolerated procedure well with no complications   Post-procedure details: sterile dressing applied and wound care instructions given   Dressing type: bandage, pressure dressing and bacitracin (Mupirocin)    Specimen 2 - Surgical pathology Differential Diagnosis: D48.5 Lipoma vs other  Check Margins: No Rubbery nodule 2.1cm  Right ant distal thigh lateral  Skin excision  Lesion length (cm):  3.1 Lesion width (cm):  3.1 Margin per side (cm):  0 Total excision diameter (cm):  3.1 Informed consent: discussed and consent obtained   Timeout: patient name, date of birth, surgical site, and procedure verified   Procedure prep:  Patient was prepped and draped in usual sterile fashion Prep type:  Isopropyl alcohol and povidone-iodine Anesthesia: the lesion was anesthetized in a standard fashion   Anesthetic:  1% lidocaine w/ epinephrine  1-100,000 buffered w/ 8.4% NaHCO3 (3cc lido w/ epi, 1cc bupivicaine, Total of 4cc) Instrument used: #15 blade   Hemostasis achieved with: pressure   Hemostasis achieved with comment:  Electrocautery Outcome: patient tolerated procedure well with no complications   Post-procedure details: sterile dressing applied and wound care instructions given   Dressing type: bandage, pressure dressing and bacitracin (Mupirocin)    Skin repair Complexity:  Complex Final length (cm):  1 Reason for type of repair: reduce tension to allow closure, reduce the risk of dehiscence, infection, and necrosis, reduce subcutaneous dead space and avoid a hematoma, allow closure of the large defect, preserve normal anatomy, preserve normal anatomical and functional relationships and enhance both functionality and cosmetic results   Undermining: area extensively undermined   Undermining comment:  Undermining Defect 3.1cm Subcutaneous layers (deep stitches):  Suture size:  3-0 Suture type: Vicryl (polyglactin 910)   Subcutaneous suture technique: Inverted Dermal. Fine/surface layer approximation (top stitches):  Suture size:  3-0 Suture type: nylon   Stitches: horizontal mattress   Suture removal (days):  7 Hemostasis achieved with: pressure Outcome: patient tolerated procedure well with no complications   Post-procedure details: sterile dressing applied and wound care instructions given   Dressing type: bandage, pressure dressing and bacitracin (Mupirocin)    Specimen 3 - Surgical pathology Differential Diagnosis: D48.5 Lipoma vs other  Check Margins: No Rubbery nodule 3.1cm  Lipomas vs other  Start Mupirocin oint qd to excision sites  Related Medications mupirocin ointment (BACTROBAN) 2 % Apply 1 application topically daily. Qd to excision site  Lipoma of right lower extremity Right  Thigh - Anterior  Benign, Discussed excising a few at a time.  Patient will schedule surgery.  Return in about 1  week (around 06/25/2021) for suture removal.  I, Othelia Pulling, RMA, am acting as scribe for Sarina Ser, MD . Documentation: I have reviewed the above documentation for accuracy and completeness, and I agree with the above.  Sarina Ser, MD

## 2021-06-21 ENCOUNTER — Telehealth: Payer: Self-pay

## 2021-06-21 ENCOUNTER — Encounter: Payer: Self-pay | Admitting: Dermatology

## 2021-06-21 NOTE — Telephone Encounter (Signed)
-----   Message from Ralene Bathe, MD sent at 06/20/2021  5:12 PM EDT ----- Diagnosis 1. Skin (M), right ant thigh distal superior EXCISION, ANGIOLIPOMA 2. Skin (M), right ant thigh distal medial EXCISION, ANGIOLIPOMA 3. Skin (M), right ant distal thigh lateral EXCISION, ANGIOLIPOMA  1,2,3 - all 3 benign Lipoma

## 2021-06-21 NOTE — Telephone Encounter (Signed)
Discussed biopsy results with pt  °

## 2021-06-25 ENCOUNTER — Other Ambulatory Visit: Payer: Self-pay

## 2021-06-25 ENCOUNTER — Ambulatory Visit (INDEPENDENT_AMBULATORY_CARE_PROVIDER_SITE_OTHER): Payer: BC Managed Care – PPO | Admitting: Dermatology

## 2021-06-25 DIAGNOSIS — Z4802 Encounter for removal of sutures: Secondary | ICD-10-CM

## 2021-06-25 DIAGNOSIS — D1723 Benign lipomatous neoplasm of skin and subcutaneous tissue of right leg: Secondary | ICD-10-CM

## 2021-06-25 NOTE — Patient Instructions (Signed)

## 2021-06-25 NOTE — Progress Notes (Signed)
   Follow-Up Visit   Subjective  CAMIKA MARSICO is a 55 y.o. female who presents for the following: suture removal (R ant thigh distal superior, medial and lat - pathology proven benign angiolipomas, patient is here today for suture removal.).  The following portions of the chart were reviewed this encounter and updated as appropriate:   Tobacco  Allergies  Meds  Problems  Med Hx  Surg Hx  Fam Hx     Review of Systems:  No other skin or systemic complaints except as noted in HPI or Assessment and Plan.  Objective  Well appearing patient in no apparent distress; mood and affect are within normal limits.  A focused examination was performed including the legs. Relevant physical exam findings are noted in the Assessment and Plan.  R thigh ant distal superior, medial, lateral Healing excision sites.   Assessment & Plan  Lipoma of right lower extremity R thigh ant distal superior, medial, lateral  Encounter for Removal of Sutures - Incision site at the R ant thigh distal superior, medial, and lateral is clean, dry and intact - Wound cleansed, sutures removed, wound cleansed and steri strips applied.  - Discussed pathology results showing benign angiolipomas.  - Patient advised to keep steri-strips dry until they fall off. - Scars remodel for a full year. - Once steri-strips fall off, patient can apply over-the-counter silicone scar cream each night to help with scar remodeling if desired. - Patient advised to call with any concerns or if they notice any new or changing lesions.   Return for appointment as scheduled - surgery.  Luther Redo, CMA, am acting as scribe for Sarina Ser, MD . Documentation: I have reviewed the above documentation for accuracy and completeness, and I agree with the above.  Sarina Ser, MD

## 2021-06-28 ENCOUNTER — Encounter: Payer: Self-pay | Admitting: Dermatology

## 2021-07-09 ENCOUNTER — Encounter: Payer: BC Managed Care – PPO | Admitting: Dermatology

## 2021-07-09 ENCOUNTER — Other Ambulatory Visit: Payer: Self-pay | Admitting: Physician Assistant

## 2021-07-09 DIAGNOSIS — D509 Iron deficiency anemia, unspecified: Secondary | ICD-10-CM

## 2021-07-09 DIAGNOSIS — N644 Mastodynia: Secondary | ICD-10-CM

## 2021-07-09 DIAGNOSIS — Z0001 Encounter for general adult medical examination with abnormal findings: Secondary | ICD-10-CM

## 2021-07-09 DIAGNOSIS — R300 Dysuria: Secondary | ICD-10-CM

## 2021-07-09 DIAGNOSIS — K219 Gastro-esophageal reflux disease without esophagitis: Secondary | ICD-10-CM

## 2021-07-09 DIAGNOSIS — E7800 Pure hypercholesterolemia, unspecified: Secondary | ICD-10-CM

## 2021-07-09 DIAGNOSIS — L601 Onycholysis: Secondary | ICD-10-CM

## 2021-07-09 DIAGNOSIS — G43909 Migraine, unspecified, not intractable, without status migrainosus: Secondary | ICD-10-CM

## 2021-07-09 DIAGNOSIS — G894 Chronic pain syndrome: Secondary | ICD-10-CM

## 2021-07-09 DIAGNOSIS — Q8500 Neurofibromatosis, unspecified: Secondary | ICD-10-CM

## 2021-07-09 DIAGNOSIS — Z9882 Breast implant status: Secondary | ICD-10-CM

## 2021-07-15 ENCOUNTER — Ambulatory Visit: Payer: BC Managed Care – PPO | Admitting: Physician Assistant

## 2021-07-19 ENCOUNTER — Other Ambulatory Visit: Payer: Self-pay

## 2021-07-19 ENCOUNTER — Ambulatory Visit (INDEPENDENT_AMBULATORY_CARE_PROVIDER_SITE_OTHER): Payer: BC Managed Care – PPO | Admitting: Physician Assistant

## 2021-07-19 ENCOUNTER — Encounter: Payer: Self-pay | Admitting: Physician Assistant

## 2021-07-19 VITALS — BP 118/74 | HR 77 | Temp 98.3°F | Resp 16 | Ht 64.0 in | Wt 165.4 lb

## 2021-07-19 DIAGNOSIS — R768 Other specified abnormal immunological findings in serum: Secondary | ICD-10-CM | POA: Diagnosis not present

## 2021-07-19 DIAGNOSIS — M064 Inflammatory polyarthropathy: Secondary | ICD-10-CM

## 2021-07-19 DIAGNOSIS — R300 Dysuria: Secondary | ICD-10-CM

## 2021-07-19 LAB — POCT URINALYSIS DIPSTICK
Bilirubin, UA: NEGATIVE
Blood, UA: POSITIVE
Glucose, UA: NEGATIVE
Ketones, UA: NEGATIVE
Leukocytes, UA: NEGATIVE
Nitrite, UA: POSITIVE
Protein, UA: POSITIVE — AB
Spec Grav, UA: 1.025 (ref 1.010–1.025)
Urobilinogen, UA: 2 E.U./dL — AB
pH, UA: 6 (ref 5.0–8.0)

## 2021-07-19 MED ORDER — CIPROFLOXACIN HCL 500 MG PO TABS
500.0000 mg | ORAL_TABLET | Freq: Two times a day (BID) | ORAL | 0 refills | Status: DC
Start: 2021-07-19 — End: 2021-07-29

## 2021-07-19 NOTE — Progress Notes (Signed)
[facility] [place], [place] [address]  Internal MEDICINE  Office Visit Note  Patient Name: Andrea Bernard  [id number]  [id number]  Date of Service: 07/19/2021  Chief Complaint  Patient presents with   Urinary Tract Infection     HPI Pt is here for a sick visit. -Pt has frequency and burning with lower abdominal pain. Denies any flank or low back pain. -Pt also mentions that her hands hurt really badly and feels like her joints swell and feels like her jaw is bothering her as well. She was previously seen by rheumatology but they were changing providers and was not happy with care. She was given prednisone at one point and it helped significantly. She would like to be referred to new rheumatology office. Pt does have a hx of positive ANA and reports her mother has lupus and RA. -She does have NF as well, but does not feel like this pain is due to that.  Current Medication:  Outpatient Encounter Medications as of 07/19/2021  Medication Sig   acetaminophen (TYLENOL) 325 MG tablet Take 650 mg by mouth.   albuterol (VENTOLIN HFA) 108 (90 Base) MCG/ACT inhaler TAKE 2 PUFFS BY MOUTH EVERY 6 HOURS AS NEEDED FOR WHEEZE OR SHORTNESS OF BREATH   ciprofloxacin (CIPRO) 500 MG tablet Take 1 tablet (500 mg total) by mouth 2 (two) times daily for 10 days.   cyclobenzaprine (FLEXERIL) 10 MG tablet Take 10 mg by mouth daily.   gabapentin (NEURONTIN) 300 MG capsule Take 300 mg by mouth 3 (three) times daily.   lidocaine (XYLOCAINE) 2 % jelly Apply 1 application topically as needed.   mupirocin ointment (BACTROBAN) 2 % Apply 1 application topically daily. Qd to excision site   mupirocin ointment (BACTROBAN) 2 % Apply 1 application topically daily. Qd to excision site   oxyCODONE-acetaminophen (PERCOCET/ROXICET) 5-325 MG tablet Take 1 tablet by mouth every 4 (four) hours as needed for severe pain.   OXYCONTIN 80 MG 12 hr tablet Take 80 mg by mouth every 12 (twelve)  hours.   pantoprazole (PROTONIX) 40 MG tablet Take 1 tablet (40 mg total) by mouth 2 (two) times daily.   SUMAtriptan (IMITREX) 100 MG tablet TAKE 1 TABLET BY MOUTH DAILY AS NEEDED FOR MIGRAINE.   tobramycin (TOBREX) 0.3 % ophthalmic solution APPLY 1 DROP TO TOENAILS AT BEDTIME   valACYclovir (VALTREX) 1000 MG tablet Take 1 tablet (1,000 mg total) by mouth 3 (three) times daily.   Facility-Administered Encounter Medications as of 07/19/2021  Medication   iron sucrose (VENOFER) 200 mg IVPB   sodium chloride flush (NS) 0.9 % injection 10 mL      Medical History: Past Medical History:  Diagnosis Date   Arthritis    Dysplastic nevus 03/02/2008   Right lateral thigh. Moderate atypia, limited margins free.   GERD (gastroesophageal reflux disease)    Headache    Iron deficiency anemia 06/15/2017   Neurofibromatosis (HCC)      Vital Signs: BP 118/74   Pulse 77   Temp 98.3 F (36.8 C)   Resp 16   Ht 5\' 4"  (1.626 m)   Wt 165 lb 6.4 oz (75 kg)   LMP 12/18/2017   SpO2 97%   BMI 28.39 kg/m    Review of Systems  Constitutional:  Negative for fatigue and fever.  HENT:  Negative for congestion, mouth sores and postnasal drip.   Respiratory:  Negative for cough.   Cardiovascular:  Negative for chest pain.  Gastrointestinal:  Positive for abdominal pain.  Genitourinary:  Positive for dysuria and frequency. Negative for flank pain.  Musculoskeletal:  Positive for arthralgias and joint swelling.  Psychiatric/Behavioral: Negative.     Physical Exam Vitals and nursing note reviewed.  Constitutional:      General: She is not in acute distress.    Appearance: She is well-developed. She is not diaphoretic.  HENT:     Head: Normocephalic and atraumatic.     Mouth/Throat:     Pharynx: No oropharyngeal exudate.  Eyes:     Pupils: Pupils are equal, round, and reactive to light.  Neck:     Thyroid: No thyromegaly.     Vascular: No JVD.     Trachea: No tracheal deviation.   Cardiovascular:     Rate and Rhythm: Normal rate and regular rhythm.     Heart sounds: Normal heart sounds. No murmur heard.   No friction rub. No gallop.  Pulmonary:     Effort: Pulmonary effort is normal. No respiratory distress.     Breath sounds: No wheezing or rales.  Chest:     Chest wall: No tenderness.  Abdominal:     General: Bowel sounds are normal.     Palpations: Abdomen is soft.     Tenderness: There is no right CVA tenderness or left CVA tenderness.  Musculoskeletal:     Cervical back: Normal range of motion and neck supple.     Comments: Decreased ROM in fingers with significant pain  Lymphadenopathy:     Cervical: No cervical adenopathy.  Skin:    General: Skin is warm and dry.  Neurological:     Mental Status: She is alert and oriented to person, place, and time.     Cranial Nerves: No cranial nerve deficit.  Psychiatric:        Behavior: Behavior normal.        Thought Content: Thought content normal.        Judgment: Judgment normal.      Assessment/Plan: 1. Dysuria UA shows signs of infection and with symptoms will go ahead and start Cipro twice daily for 10 days pending culture results.  Will call patient to change antibiotic if indicated based on C/S.  Patient has had several UTIs in the past and may need urology referral in future if continued - POCT Urinalysis Dipstick - CULTURE, URINE COMPREHENSIVE - ciprofloxacin (CIPRO) 500 MG tablet; Take 1 tablet (500 mg total) by mouth 2 (two) times daily for 10 days.  Dispense: 20 tablet; Refill: 0  2. Inflammatory polyarthropathy of multiple sites [facility]) Will be referred to new rheumatology office for further evaluation and management - Ambulatory referral to Rheumatology  3. ANA positive Will be referred to new rheumatology office for further evaluation and management - Ambulatory referral to Rheumatology   General Counseling: lindalou soltis understanding of the findings of todays visit and agrees  with plan of treatment. I have discussed any further diagnostic evaluation that may be needed or ordered today. We also reviewed her medications today. she has been encouraged to call the office with any questions or concerns that should arise related to todays visit.    Counseling:    Orders Placed This Encounter  Procedures   CULTURE, URINE COMPREHENSIVE   Ambulatory referral to Rheumatology   POCT Urinalysis Dipstick    Meds ordered this encounter  Medications   ciprofloxacin (CIPRO) 500 MG tablet    Sig: Take 1 tablet (500 mg total) by mouth 2 (two)  times daily for 10 days.    Dispense:  20 tablet    Refill:  0    Time spent:30 Minutes

## 2021-07-25 ENCOUNTER — Telehealth: Payer: Self-pay

## 2021-07-25 NOTE — Telephone Encounter (Signed)
Rheumatology  referral sent via [facility] to [name] 

## 2021-07-28 LAB — CULTURE, URINE COMPREHENSIVE

## 2021-07-29 ENCOUNTER — Other Ambulatory Visit: Payer: Self-pay

## 2021-07-29 ENCOUNTER — Telehealth: Payer: Self-pay

## 2021-07-29 DIAGNOSIS — R300 Dysuria: Secondary | ICD-10-CM

## 2021-07-29 MED ORDER — CIPROFLOXACIN HCL 500 MG PO TABS: 500.0000 mg | ORAL_TABLET | Freq: Two times a day (BID) | ORAL | 0 refills | Status: AC

## 2021-07-29 NOTE — Telephone Encounter (Signed)
Called pt an informed her that Lauren sent in 5 more days of cipro and that if she still has any issues after finishing abx that she may need to be sent to Urology

## 2021-07-30 ENCOUNTER — Ambulatory Visit (INDEPENDENT_AMBULATORY_CARE_PROVIDER_SITE_OTHER): Payer: BC Managed Care – PPO | Admitting: Dermatology

## 2021-07-30 ENCOUNTER — Telehealth: Payer: Self-pay

## 2021-07-30 ENCOUNTER — Other Ambulatory Visit: Payer: Self-pay

## 2021-07-30 DIAGNOSIS — D1723 Benign lipomatous neoplasm of skin and subcutaneous tissue of right leg: Secondary | ICD-10-CM

## 2021-07-30 DIAGNOSIS — D492 Neoplasm of unspecified behavior of bone, soft tissue, and skin: Secondary | ICD-10-CM

## 2021-07-30 DIAGNOSIS — D171 Benign lipomatous neoplasm of skin and subcutaneous tissue of trunk: Secondary | ICD-10-CM

## 2021-07-30 MED ORDER — DOXYCYCLINE HYCLATE 100 MG PO TABS: 100.0000 mg | ORAL_TABLET | Freq: Two times a day (BID) | ORAL | 0 refills | Status: DC

## 2021-07-30 NOTE — Patient Instructions (Signed)
Wound Care Instructions  Cleanse wound gently with soap and water once a day then pat dry with clean gauze. Apply a thing coat of Petrolatum (petroleum jelly, "Vaseline") over the wound (unless you have an allergy to this). We recommend that you use a new, sterile tube of Vaseline. Do not pick or remove scabs. Do not remove the yellow or white "healing tissue" from the base of the wound.  Cover the wound with fresh, clean, nonstick gauze and secure with paper tape. You may use Band-Aids in place of gauze and tape if the would is small enough, but would recommend trimming much of the tape off as there is often too much. Sometimes Band-Aids can irritate the skin.  You should call the office for your biopsy report after 1 week if you have not already been contacted.  If you experience any problems, such as abnormal amounts of bleeding, swelling, significant bruising, significant pain, or evidence of infection, please call the office immediately.  FOR ADULT SURGERY PATIENTS: If you need something for pain relief you may take 1 extra strength Tylenol (acetaminophen) AND 2 Ibuprofen (200mg each) together every 4 hours as needed for pain. (do not take these if you are allergic to them or if you have a reason you should not take them.) Typically, you may only need pain medication for 1 to 3 days.   If you have any questions or concerns for your doctor, please call our main line at [phone] and press option 4 to reach your doctor's medical assistant. If no one answers, please leave a voicemail as directed and we will return your call as soon as possible. Messages left after 4 pm will be answered the following business day.   You may also send us a message via MyChart. We typically respond to MyChart messages within 1-2 business days.  For prescription refills, please ask your pharmacy to contact our office. Our fax number is [phone].  If you have an urgent issue when the clinic is closed that  cannot wait until the next business day, you can page your doctor at the number below.    Please note that while we do our best to be available for urgent issues outside of office hours, we are not available 24/7.   If you have an urgent issue and are unable to reach us, you may choose to seek medical care at your doctor's office, retail clinic, urgent care center, or emergency room.  If you have a medical emergency, please immediately call 911 or go to the emergency department.  Pager Numbers  - Dr. Kowalski: [phone]  - Dr. Moye: [phone]  - Dr. Stewart: [phone]  In the event of inclement weather, please call our main line at [phone] for an update on the status of any delays or closures.  Dermatology Medication Tips: Please keep the boxes that topical medications come in in order to help keep track of the instructions about where and how to use these. Pharmacies typically print the medication instructions only on the boxes and not directly on the medication tubes.   If your medication is too expensive, please contact our office at [phone] option 4 or send us a message through MyChart.   We are unable to tell what your co-pay for medications will be in advance as this is different depending on your insurance coverage. However, we may be able to find a substitute medication at lower cost or fill out paperwork to get insurance to cover a needed   medication.   If a prior authorization is required to get your medication covered by your insurance company, please allow us 1-2 business days to complete this process.  Drug prices often vary depending on where the prescription is filled and some pharmacies may offer cheaper prices.  The website www.goodrx.com contains coupons for medications through different pharmacies. The prices here do not account for what the cost may be with help from insurance (it may be cheaper with your insurance), but the website can give you the  price if you did not use any insurance.  - You can print the associated coupon and take it with your prescription to the pharmacy.  - You may also stop by our office during regular business hours and pick up a GoodRx coupon card.  - If you need your prescription sent electronically to a different pharmacy, notify our office through [facility] MyChart or by phone at [phone] option 4.   

## 2021-07-30 NOTE — Telephone Encounter (Signed)
Patient doing fine after today's surgery./sh 

## 2021-07-30 NOTE — Telephone Encounter (Signed)
Per [name] with [facility], she left patient vm this morning to schedule appointment-Andrea Bernard

## 2021-07-30 NOTE — Telephone Encounter (Signed)
error 

## 2021-07-30 NOTE — Progress Notes (Signed)
Follow-Up Visit   Subjective  Andrea Bernard is a 55 y.o. female who presents for the following: Lipomas (R leg x 2, pt presents for excision).  The following portions of the chart were reviewed this encounter and updated as appropriate:   Tobacco  Allergies  Meds  Problems  Med Hx  Surg Hx  Fam Hx     Review of Systems:  No other skin or systemic complaints except as noted in HPI or Assessment and Plan.  Objective  Well appearing patient in no apparent distress; mood and affect are within normal limits.  A focused examination was performed including right leg. Relevant physical exam findings are noted in the Assessment and Plan.  Right ant thigh superior Rubbery nodule 4.5 x 3.0cm  Right ant thigh inferior Rubbery nodule 3.2cm  L abdomen, R abdomen Rubbery nodules L abdomen x 2, R abdomen x 2   Assessment & Plan  Neoplasm of skin (2) -suspected symptomatic painful growing lipomas x2 right thigh Right ant thigh superior  Skin excision  Lesion length (cm):  4.5 Lesion width (cm):  3 Margin per side (cm):  0 Total excision diameter (cm):  4.5 Informed consent: discussed and consent obtained   Timeout: patient name, date of birth, surgical site, and procedure verified   Procedure prep:  Patient was prepped and draped in usual sterile fashion Prep type:  Isopropyl alcohol and povidone-iodine Anesthesia: the lesion was anesthetized in a standard fashion   Anesthetic:  1% lidocaine w/ epinephrine 1-100,000 buffered w/ 8.4% NaHCO3 (3.0cc lido w/epi, 3.0cc bupivicaine, Total = 6cc) Instrument used: #15 blade   Hemostasis achieved with: pressure   Hemostasis achieved with comment:  Electrocautery Outcome: patient tolerated procedure well with no complications   Post-procedure details: sterile dressing applied and wound care instructions given   Dressing type: pressure dressing and bacitracin (Mupirocin)    Skin repair Complexity:  Complex Final length (cm):   1.5 Reason for type of repair: reduce tension to allow closure, reduce the risk of dehiscence, infection, and necrosis, reduce subcutaneous dead space and avoid a hematoma, allow closure of the large defect, preserve normal anatomy, preserve normal anatomical and functional relationships and enhance both functionality and cosmetic results   Undermining: area extensively undermined   Undermining comment:  Undermining Defect 4.5cm Subcutaneous layers (deep stitches):  Suture size:  3-0 Suture type: Vicryl (polyglactin 910)   Subcutaneous suture technique: Inverted Dermal. Fine/surface layer approximation (top stitches):  Suture size:  3-0 Suture type: nylon   Stitches: horizontal mattress   Suture removal (days):  7 Hemostasis achieved with: pressure Outcome: patient tolerated procedure well with no complications   Post-procedure details: sterile dressing applied and wound care instructions given   Dressing type: pressure dressing and bacitracin (Mupirocin)    doxycycline (VIBRA-TABS) 100 MG tablet Take 1 tablet (100 mg total) by mouth 2 (two) times daily for 14 days. With food and plenty of fluid  Specimen 1 - Surgical pathology Differential Diagnosis: D48.5 Lipoma vs other  Check Margins: No Rubbery nodule 4.5 x 3.0cm  Right ant thigh inferior  Skin excision  Lesion length (cm):  3.2 Lesion width (cm):  3.2 Margin per side (cm):  0 Total excision diameter (cm):  3.2 Informed consent: discussed and consent obtained   Timeout: patient name, date of birth, surgical site, and procedure verified   Procedure prep:  Patient was prepped and draped in usual sterile fashion Prep type:  Isopropyl alcohol and povidone-iodine Anesthesia: the lesion was  anesthetized in a standard fashion   Anesthetic:  1% lidocaine w/ epinephrine 1-100,000 buffered w/ 8.4% NaHCO3 (3.0cc lido w/ epi, 3.0cc bupivicaine, Total = 6cc) Instrument used: #15 blade   Hemostasis achieved with: pressure    Hemostasis achieved with comment:  Electrocautery Outcome: patient tolerated procedure well with no complications   Post-procedure details: sterile dressing applied and wound care instructions given   Dressing type: pressure dressing and bacitracin (Mupirocin)    Skin repair Complexity:  Complex Final length (cm):  1.5 Reason for type of repair: reduce tension to allow closure, reduce the risk of dehiscence, infection, and necrosis, reduce subcutaneous dead space and avoid a hematoma, allow closure of the large defect, preserve normal anatomy, preserve normal anatomical and functional relationships and enhance both functionality and cosmetic results   Undermining: area extensively undermined   Undermining comment:  Undermining Defect 3.2cm Subcutaneous layers (deep stitches):  Suture size:  3-0 Suture type: Vicryl (polyglactin 910)   Subcutaneous suture technique: Inverted Dermal. Fine/surface layer approximation (top stitches):  Suture size:  3-0 Suture type: nylon   Stitches: horizontal mattress   Suture removal (days):  7 Hemostasis achieved with: pressure Outcome: patient tolerated procedure well with no complications   Post-procedure details: sterile dressing applied and wound care instructions given   Dressing type: pressure dressing and bacitracin (Mupirocin)    Specimen 2 - Surgical pathology Differential Diagnosis: D48.5 Lipoma vs other  Check Margins: No Rubbery nodule 3.2cm  Start Doxycycline 100 mg 1 po bid x 7 days with food and plenty of fluid  Related Medications mupirocin ointment (BACTROBAN) 2 % Apply 1 application topically daily. Qd to excision site  mupirocin ointment (BACTROBAN) 2 % Apply 1 application topically daily. Qd to excision site  Lipomas of torso -symptomatic growing and changing L abdomen, R abdomen Benign, discussed excising, pt will schedule 2 surgeries and will do 2 at a time.  Return in about 1 week (around 08/06/2021) for suture  removal.  I, Othelia Pulling, RMA, am acting as scribe for Sarina Ser, MD . Documentation: I have reviewed the above documentation for accuracy and completeness, and I agree with the above.  Sarina Ser, MD

## 2021-07-31 ENCOUNTER — Encounter: Payer: Self-pay | Admitting: Dermatology

## 2021-08-06 ENCOUNTER — Other Ambulatory Visit: Payer: Self-pay

## 2021-08-06 ENCOUNTER — Telehealth: Payer: Self-pay

## 2021-08-06 ENCOUNTER — Encounter: Payer: Self-pay | Admitting: Dermatology

## 2021-08-06 ENCOUNTER — Ambulatory Visit (INDEPENDENT_AMBULATORY_CARE_PROVIDER_SITE_OTHER): Payer: BC Managed Care – PPO | Admitting: Dermatology

## 2021-08-06 DIAGNOSIS — D1723 Benign lipomatous neoplasm of skin and subcutaneous tissue of right leg: Secondary | ICD-10-CM

## 2021-08-06 DIAGNOSIS — D171 Benign lipomatous neoplasm of skin and subcutaneous tissue of trunk: Secondary | ICD-10-CM

## 2021-08-06 DIAGNOSIS — D492 Neoplasm of unspecified behavior of bone, soft tissue, and skin: Secondary | ICD-10-CM

## 2021-08-06 NOTE — Telephone Encounter (Signed)
Patient states she does not want to go back to [facility]. I was unable to find other rheumatologist in area. She stated [place] was ok. Manually faxed referral to [facility] in [facility]

## 2021-08-06 NOTE — Progress Notes (Signed)
Follow-Up Visit   Subjective  Andrea Bernard is a 55 y.o. female who presents for the following: Lipoma (L abdomen, pt presents for excision/) and Lipomas bx proven (R ant thigh superior, R ant thigh inferior, 1 wk post op, pt presents for suture removal).  The following portions of the chart were reviewed this encounter and updated as appropriate:   Tobacco  Allergies  Meds  Problems  Med Hx  Surg Hx  Fam Hx     Review of Systems:  No other skin or systemic complaints except as noted in HPI or Assessment and Plan.  Objective  Well appearing patient in no apparent distress; mood and affect are within normal limits.  A focused examination was performed including abdomen. Relevant physical exam findings are noted in the Assessment and Plan.  L ant costal area lateral Rubbery nodule 2.5cm  L ant costal area medial Rubbery nodule 3.2cm  R ant thigh superior, R ant thigh inferior Healing excision sites   Assessment & Plan  Neoplasm of skin (2) L ant costal area lateral  Skin excision  Lesion length (cm):  2.5 Lesion width (cm):  2.5 Margin per side (cm):  0 Total excision diameter (cm):  2.5 Informed consent: discussed and consent obtained   Timeout: patient name, date of birth, surgical site, and procedure verified   Procedure prep:  Patient was prepped and draped in usual sterile fashion Prep type:  Isopropyl alcohol and povidone-iodine Anesthesia: the lesion was anesthetized in a standard fashion   Anesthetic:  1% lidocaine w/ epinephrine 1-100,000 buffered w/ 8.4% NaHCO3 (3cc lido w/epi, 1.5cc bupivicaine, total of 4.5cc) Instrument used: #15 blade   Hemostasis achieved with: pressure   Hemostasis achieved with comment:  Electrocautery Outcome: patient tolerated procedure well with no complications   Post-procedure details: sterile dressing applied and wound care instructions given   Dressing type: bandage and pressure dressing (Mupirocin)    Skin  repair Complexity:  Complex Final length (cm):  1.5 Reason for type of repair: reduce tension to allow closure, reduce the risk of dehiscence, infection, and necrosis, reduce subcutaneous dead space and avoid a hematoma, allow closure of the large defect, preserve normal anatomy, preserve normal anatomical and functional relationships and enhance both functionality and cosmetic results   Undermining: area extensively undermined   Undermining comment:  Undermining Defect 2.5cm Subcutaneous layers (deep stitches):  Suture size:  3-0 Suture type: Vicryl (polyglactin 910)   Subcutaneous suture technique: Inverted Dermal. Fine/surface layer approximation (top stitches):  Suture size:  3-0 Suture type: nylon   Stitches: horizontal mattress   Suture removal (days):  7 Hemostasis achieved with: pressure Outcome: patient tolerated procedure well with no complications   Post-procedure details: sterile dressing applied and wound care instructions given   Dressing type: bandage, pressure dressing and bacitracin (Mupirocin)    Specimen 2 - Surgical pathology Differential Diagnosis: D48.5 Lipoma vs other  Check Margins: No Rubbery nodule 2.5cm  L ant costal area medial  Skin excision  Lesion length (cm):  3.2 Lesion width (cm):  3.2 Margin per side (cm):  0 Total excision diameter (cm):  3.2 Informed consent: discussed and consent obtained   Timeout: patient name, date of birth, surgical site, and procedure verified   Procedure prep:  Patient was prepped and draped in usual sterile fashion Prep type:  Isopropyl alcohol and povidone-iodine Anesthesia: the lesion was anesthetized in a standard fashion   Anesthetic:  1% lidocaine w/ epinephrine 1-100,000 buffered w/ 8.4% NaHCO3 (3cc  lido w/ epi, 1.5cc bupivicaine, Total 4.5cc) Instrument used: #15 blade   Hemostasis achieved with: pressure   Hemostasis achieved with comment:  Electrocautery Outcome: patient tolerated procedure well with no  complications   Post-procedure details: sterile dressing applied and wound care instructions given   Dressing type: bandage and pressure dressing (Mupirocin)    Skin repair Complexity:  Complex Final length (cm):  2 Reason for type of repair: reduce tension to allow closure, reduce the risk of dehiscence, infection, and necrosis, reduce subcutaneous dead space and avoid a hematoma, allow closure of the large defect, preserve normal anatomy, preserve normal anatomical and functional relationships and enhance both functionality and cosmetic results   Undermining: area extensively undermined   Undermining comment:  Undermining Defect 3.2cm Subcutaneous layers (deep stitches):  Suture size:  3-0 Suture type: Vicryl (polyglactin 910)   Subcutaneous suture technique: Inverted Dermal. Fine/surface layer approximation (top stitches):  Suture size:  3-0 Suture type: nylon   Stitches: horizontal mattress   Suture removal (days):  7 Hemostasis achieved with: pressure Outcome: patient tolerated procedure well with no complications   Post-procedure details: sterile dressing applied and wound care instructions given   Dressing type: bandage, pressure dressing and bacitracin (Mupirocin)    Specimen 1 - Surgical pathology Differential Diagnosis: D48.5 Lipoma vs other  Check Margins: No Rubbery nodule 3.2cm  Related Medications mupirocin ointment (BACTROBAN) 2 % Apply 1 application topically daily. Qd to excision site  Lipoma of right lower extremity R ant thigh superior, R ant thigh inferior  Lipomas bx proven  Encounter for Removal of Sutures - Incision site at the R ant thigh superior, R ant thigh inferior is clean, dry and intact - Wound cleansed, sutures removed, wound cleansed and steri strips applied.  - Discussed pathology results showing Lipomas  - Patient advised to keep steri-strips dry until they fall off. - Scars remodel for a full year. - Once steri-strips fall off, patient  can apply over-the-counter silicone scar cream each night to help with scar remodeling if desired. - Patient advised to call with any concerns or if they notice any new or changing lesions.   Return in about 1 week (around 08/13/2021) for suture removal, and lipoma surgery.  I, Othelia Pulling, RMA, am acting as scribe for Sarina Ser, MD . Documentation: I have reviewed the above documentation for accuracy and completeness, and I agree with the above.  Sarina Ser, MD

## 2021-08-06 NOTE — Patient Instructions (Addendum)
If you have any questions or concerns for your doctor, please call our main line at [phone] and press option 4 to reach your doctor's medical assistant. If no one answers, please leave a voicemail as directed and we will return your call as soon as possible. Messages left after 4 pm will be answered the following business day.   You may also send us a message via MyChart. We typically respond to MyChart messages within 1-2 business days.  For prescription refills, please ask your pharmacy to contact our office. Our fax number is [phone].  If you have an urgent issue when the clinic is closed that cannot wait until the next business day, you can page your doctor at the number below.    Please note that while we do our best to be available for urgent issues outside of office hours, we are not available 24/7.   If you have an urgent issue and are unable to reach us, you may choose to seek medical care at your doctor's office, retail clinic, urgent care center, or emergency room.  If you have a medical emergency, please immediately call 911 or go to the emergency department.  Pager Numbers  - Dr. Kowalski: [phone]  - Dr. Moye: [phone]  - Dr. Stewart: [phone]  In the event of inclement weather, please call our main line at [phone] for an update on the status of any delays or closures.  Dermatology Medication Tips: Please keep the boxes that topical medications come in in order to help keep track of the instructions about where and how to use these. Pharmacies typically print the medication instructions only on the boxes and not directly on the medication tubes.   If your medication is too expensive, please contact our office at [phone] option 4 or send us a message through MyChart.   We are unable to tell what your co-pay for medications will be in advance as this is different depending on your insurance coverage. However, we may be able to find a substitute  medication at lower cost or fill out paperwork to get insurance to cover a needed medication.   If a prior authorization is required to get your medication covered by your insurance company, please allow us 1-2 business days to complete this process.  Drug prices often vary depending on where the prescription is filled and some pharmacies may offer cheaper prices.  The website www.goodrx.com contains coupons for medications through different pharmacies. The prices here do not account for what the cost may be with help from insurance (it may be cheaper with your insurance), but the website can give you the price if you did not use any insurance.  - You can print the associated coupon and take it with your prescription to the pharmacy.  - You may also stop by our office during regular business hours and pick up a GoodRx coupon card.  - If you need your prescription sent electronically to a different pharmacy, notify our office through [facility] MyChart or by phone at [phone] option 4.   Wound Care Instructions  Cleanse wound gently with soap and water once a day then pat dry with clean gauze. Apply a thing coat of Petrolatum (petroleum jelly, "Vaseline") over the wound (unless you have an allergy to this). We recommend that you use a new, sterile tube of Vaseline. Do not pick or remove scabs. Do not remove the yellow or white "healing tissue" from the base of the wound.  Cover the   wound with fresh, clean, nonstick gauze and secure with paper tape. You may use Band-Aids in place of gauze and tape if the would is small enough, but would recommend trimming much of the tape off as there is often too much. Sometimes Band-Aids can irritate the skin.  You should call the office for your biopsy report after 1 week if you have not already been contacted.  If you experience any problems, such as abnormal amounts of bleeding, swelling, significant bruising, significant pain, or evidence of infection,  please call the office immediately.  FOR ADULT SURGERY PATIENTS: If you need something for pain relief you may take 1 extra strength Tylenol (acetaminophen) AND 2 Ibuprofen (200mg each) together every 4 hours as needed for pain. (do not take these if you are allergic to them or if you have a reason you should not take them.) Typically, you may only need pain medication for 1 to 3 days.    

## 2021-08-06 NOTE — Telephone Encounter (Signed)
Per proficient, patient declined to make appointment with rheumatologist. I sent her mychart message to call me-Andrea Bernard

## 2021-08-06 NOTE — Telephone Encounter (Signed)
Patient doing fine after today's surgery./sh 

## 2021-08-13 ENCOUNTER — Encounter: Payer: Self-pay | Admitting: Dermatology

## 2021-08-13 ENCOUNTER — Ambulatory Visit (INDEPENDENT_AMBULATORY_CARE_PROVIDER_SITE_OTHER): Payer: BC Managed Care – PPO | Admitting: Dermatology

## 2021-08-13 ENCOUNTER — Other Ambulatory Visit: Payer: Self-pay

## 2021-08-13 DIAGNOSIS — Z4802 Encounter for removal of sutures: Secondary | ICD-10-CM

## 2021-08-13 DIAGNOSIS — D171 Benign lipomatous neoplasm of skin and subcutaneous tissue of trunk: Secondary | ICD-10-CM

## 2021-08-13 NOTE — Progress Notes (Signed)
   Follow-Up Visit   Subjective  Andrea Bernard is a 55 y.o. female who presents for the following: suture removal/post op (Pathology proven lipomas of the L ant costal area med and lat - patient is here today for suture removal.).  The following portions of the chart were reviewed this encounter and updated as appropriate:   Tobacco  Allergies  Meds  Problems  Med Hx  Surg Hx  Fam Hx     Review of Systems:  No other skin or systemic complaints except as noted in HPI or Assessment and Plan.  Objective  Well appearing patient in no apparent distress; mood and affect are within normal limits.  A focused examination was performed including the abdomen. Relevant physical exam findings are noted in the Assessment and Plan.  L ant costal area lat, L ant costal area med Healing excision sites.    Assessment & Plan  Lipoma of torso (2) L ant costal area lat; L ant costal area med  Encounter for Removal of Sutures - Incision site at the L ant costal area med and lat is clean, dry and intact - Wound cleansed, sutures removed, wound cleansed and steri strips applied.  - Discussed pathology results showing benign lipomas.  - Patient advised to keep steri-strips dry until they fall off. - Scars remodel for a full year. - Once steri-strips fall off, patient can apply over-the-counter silicone scar cream each night to help with scar remodeling if desired. - Patient advised to call with any concerns or if they notice any new or changing lesions.   Return for appointment as scheduled.  Luther Redo, CMA, am acting as scribe for Sarina Ser, MD . Documentation: I have reviewed the above documentation for accuracy and completeness, and I agree with the above.  Sarina Ser, MD

## 2021-08-13 NOTE — Patient Instructions (Signed)

## 2021-08-20 ENCOUNTER — Ambulatory Visit (INDEPENDENT_AMBULATORY_CARE_PROVIDER_SITE_OTHER): Payer: BC Managed Care – PPO | Admitting: Dermatology

## 2021-08-20 ENCOUNTER — Other Ambulatory Visit: Payer: Self-pay

## 2021-08-20 DIAGNOSIS — D492 Neoplasm of unspecified behavior of bone, soft tissue, and skin: Secondary | ICD-10-CM

## 2021-08-20 DIAGNOSIS — D485 Neoplasm of uncertain behavior of skin: Secondary | ICD-10-CM | POA: Diagnosis not present

## 2021-08-20 MED ORDER — MUPIROCIN 2 % EX OINT: 1.0000 "application " | TOPICAL_OINTMENT | Freq: Every day | CUTANEOUS | 0 refills | Status: DC

## 2021-08-20 MED ORDER — DOXYCYCLINE MONOHYDRATE 100 MG PO TABS: 100.0000 mg | ORAL_TABLET | Freq: Two times a day (BID) | ORAL | 0 refills | Status: AC

## 2021-08-20 NOTE — Progress Notes (Signed)
Follow-Up Visit   Subjective  Andrea Bernard is a 55 y.o. female who presents for the following: Lesions (Of the L abdomen - some are tender and painful. She is here today for excision. ).  The following portions of the chart were reviewed this encounter and updated as appropriate:   Tobacco  Allergies  Meds  Problems  Med Hx  Surg Hx  Fam Hx     Review of Systems:  No other skin or systemic complaints except as noted in HPI or Assessment and Plan.  Objective  Well appearing patient in no apparent distress; mood and affect are within normal limits.  A focused examination was performed including the abdomen. Relevant physical exam findings are noted in the Assessment and Plan.  L med abdomen above the tummy tuck scar Fatty nodule 2.1 cm   L lat abdomen above the tummy tuck scar Fatty nodule 2.1   Assessment & Plan  Neoplasm of uncertain behavior of skin (2) L med abdomen above the tummy tuck scar  mupirocin ointment (BACTROBAN) 2 % Apply 1 application topically daily. Apply to healing wounds QD.  Skin excision  Lesion length (cm):  2.1 Lesion width (cm):  2.1 Margin per side (cm):  0 Total excision diameter (cm):  2.1 Informed consent: discussed and consent obtained   Timeout: patient name, date of birth, surgical site, and procedure verified   Procedure prep:  Patient was prepped and draped in usual sterile fashion Prep type:  Isopropyl alcohol and povidone-iodine Anesthesia: the lesion was anesthetized in a standard fashion   Anesthetic:  1% lidocaine w/ epinephrine 1-100,000 buffered w/ 8.4% NaHCO3 (3 cc) Hemostasis achieved with: pressure   Hemostasis achieved with comment:  Electrocautery Outcome: patient tolerated procedure well with no complications   Post-procedure details: sterile dressing applied and wound care instructions given   Dressing type: bandage and pressure dressing    Skin repair Complexity:  Complex Final length (cm):  2.1 Informed  consent: discussed and consent obtained   Timeout: patient name, date of birth, surgical site, and procedure verified   Procedure prep:  Patient was prepped and draped in usual sterile fashion Prep type:  Povidone-iodine Anesthesia: the lesion was anesthetized in a standard fashion   Anesthesia comment:  2.1 cm Reason for type of repair: reduce tension to allow closure, reduce the risk of dehiscence, infection, and necrosis, reduce subcutaneous dead space and avoid a hematoma, allow closure of the large defect, preserve normal anatomy, preserve normal anatomical and functional relationships and enhance both functionality and cosmetic results   Undermining comment:  2.1 cm Subcutaneous layers (deep stitches):  Suture size:  4-0 Suture type: Vicryl (polyglactin 910)   Fine/surface layer approximation (top stitches):  Suture size:  4-0 Stitches: horizontal mattress   Suture removal (days):  7 Hemostasis achieved with: suture and pressure Outcome: patient tolerated procedure well with no complications   Post-procedure details: sterile dressing applied and wound care instructions given   Dressing type: bandage and pressure dressing    doxycycline (ADOXA) 100 MG tablet Take 1 tablet (100 mg total) by mouth 2 (two) times daily for 7 days. Take with food and drink  Specimen 1 - Surgical pathology Differential Diagnosis: D48.5 r/o lipoma vs other Check Margins: No  L lat abdomen above the tummy tuck scar  Skin excision  Lesion length (cm):  2.1 Lesion width (cm):  2.1 Margin per side (cm):  0 Total excision diameter (cm):  2.1 Informed consent: discussed and consent  obtained   Timeout: patient name, date of birth, surgical site, and procedure verified   Procedure prep:  Patient was prepped and draped in usual sterile fashion Prep type:  Isopropyl alcohol and povidone-iodine Anesthesia: the lesion was anesthetized in a standard fashion   Anesthetic:  1% lidocaine w/ epinephrine  1-100,000 buffered w/ 8.4% NaHCO3 (3 cc) Hemostasis achieved with: pressure   Hemostasis achieved with comment:  Electrocautery Outcome: patient tolerated procedure well with no complications   Post-procedure details: sterile dressing applied and wound care instructions given   Dressing type: bandage and pressure dressing    Skin repair Complexity:  Complex Final length (cm):  2.1 Informed consent: discussed and consent obtained   Timeout: patient name, date of birth, surgical site, and procedure verified   Procedure prep:  Patient was prepped and draped in usual sterile fashion Prep type:  Povidone-iodine Anesthesia: the lesion was anesthetized in a standard fashion   Reason for type of repair: reduce tension to allow closure, reduce the risk of dehiscence, infection, and necrosis, reduce subcutaneous dead space and avoid a hematoma, allow closure of the large defect, preserve normal anatomy, preserve normal anatomical and functional relationships and enhance both functionality and cosmetic results   Undermining comment:  2.1 cm Subcutaneous layers (deep stitches):  Suture size:  4-0 Suture type: Vicryl (polyglactin 910)   Fine/surface layer approximation (top stitches):  Suture size:  4-0 Suture type: nylon   Stitches: horizontal mattress   Suture removal (days):  7 Hemostasis achieved with: suture and pressure Outcome: patient tolerated procedure well with no complications   Post-procedure details: sterile dressing applied and wound care instructions given   Dressing type: bandage and pressure dressing    Specimen 2 - Surgical pathology Differential Diagnosis: D48.5 r/o lipoma vs other  Check Margins: No  Start Mupirocin 2% ointment QD to healing wounds.   Start Doxycycline 100mg  1 po bid with food and drink x 7 days.  Doxycycline should be taken with food to prevent nausea. Do not lay down for 30 minutes after taking. Be cautious with sun exposure and use good sun  protection while on this medication. Pregnant women should not take this medication.     Neoplasm of skin  Related Medications mupirocin ointment (BACTROBAN) 2 % Apply 1 application topically daily. Qd to excision site  Return in about 1 week (around 08/27/2021) for suture removal.  I, Rudell Cobb, CMA, am acting as scribe for Sarina Ser, MD . Documentation: I have reviewed the above documentation for accuracy and completeness, and I agree with the above.  Sarina Ser, MD

## 2021-08-20 NOTE — Patient Instructions (Addendum)
Wound Care Instructions  Cleanse wound gently with soap and water once a day then pat dry with clean gauze. Apply a thing coat of Petrolatum (petroleum jelly, "Vaseline") over the wound (unless you have an allergy to this). We recommend that you use a new, sterile tube of Vaseline. Do not pick or remove scabs. Do not remove the yellow or white "healing tissue" from the base of the wound.  Cover the wound with fresh, clean, nonstick gauze and secure with paper tape. You may use Band-Aids in place of gauze and tape if the would is small enough, but would recommend trimming much of the tape off as there is often too much. Sometimes Band-Aids can irritate the skin.  You should call the office for your biopsy report after 1 week if you have not already been contacted.  If you experience any problems, such as abnormal amounts of bleeding, swelling, significant bruising, significant pain, or evidence of infection, please call the office immediately.  FOR ADULT SURGERY PATIENTS: If you need something for pain relief you may take 1 extra strength Tylenol (acetaminophen) AND 2 Ibuprofen (200mg each) together every 4 hours as needed for pain. (do not take these if you are allergic to them or if you have a reason you should not take them.) Typically, you may only need pain medication for 1 to 3 days.   If you have any questions or concerns for your doctor, please call our main line at [phone] and press option 4 to reach your doctor's medical assistant. If no one answers, please leave a voicemail as directed and we will return your call as soon as possible. Messages left after 4 pm will be answered the following business day.   You may also send us a message via MyChart. We typically respond to MyChart messages within 1-2 business days.  For prescription refills, please ask your pharmacy to contact our office. Our fax number is [phone].  If you have an urgent issue when the clinic is closed that  cannot wait until the next business day, you can page your doctor at the number below.    Please note that while we do our best to be available for urgent issues outside of office hours, we are not available 24/7.   If you have an urgent issue and are unable to reach us, you may choose to seek medical care at your doctor's office, retail clinic, urgent care center, or emergency room.  If you have a medical emergency, please immediately call 911 or go to the emergency department.  Pager Numbers  - Dr. Kowalski: [phone]  - Dr. Moye: [phone]  - Dr. Stewart: [phone]  In the event of inclement weather, please call our main line at [phone] for an update on the status of any delays or closures.  Dermatology Medication Tips: Please keep the boxes that topical medications come in in order to help keep track of the instructions about where and how to use these. Pharmacies typically print the medication instructions only on the boxes and not directly on the medication tubes.   If your medication is too expensive, please contact our office at [phone] option 4 or send us a message through MyChart.   We are unable to tell what your co-pay for medications will be in advance as this is different depending on your insurance coverage. However, we may be able to find a substitute medication at lower cost or fill out paperwork to get insurance to cover a needed   medication.   If a prior authorization is required to get your medication covered by your insurance company, please allow us 1-2 business days to complete this process.  Drug prices often vary depending on where the prescription is filled and some pharmacies may offer cheaper prices.  The website www.goodrx.com contains coupons for medications through different pharmacies. The prices here do not account for what the cost may be with help from insurance (it may be cheaper with your insurance), but the website can give you the  price if you did not use any insurance.  - You can print the associated coupon and take it with your prescription to the pharmacy.  - You may also stop by our office during regular business hours and pick up a GoodRx coupon card.  - If you need your prescription sent electronically to a different pharmacy, notify our office through [facility] MyChart or by phone at [phone] option 4.   

## 2021-08-25 ENCOUNTER — Encounter: Payer: Self-pay | Admitting: Dermatology

## 2021-08-27 ENCOUNTER — Other Ambulatory Visit: Payer: Self-pay

## 2021-08-27 ENCOUNTER — Ambulatory Visit (INDEPENDENT_AMBULATORY_CARE_PROVIDER_SITE_OTHER): Payer: BC Managed Care – PPO

## 2021-08-27 DIAGNOSIS — D171 Benign lipomatous neoplasm of skin and subcutaneous tissue of trunk: Secondary | ICD-10-CM

## 2021-08-27 DIAGNOSIS — Z4802 Encounter for removal of sutures: Secondary | ICD-10-CM

## 2021-08-27 DIAGNOSIS — D1720 Benign lipomatous neoplasm of skin and subcutaneous tissue of unspecified limb: Secondary | ICD-10-CM

## 2021-08-27 NOTE — Patient Instructions (Signed)

## 2021-08-27 NOTE — Progress Notes (Signed)
   Follow-Up Visit   Subjective  Andrea Bernard is a 55 y.o. female who presents for the following: Suture / Staple Removal (Suture removal of biopsy proven Lipoma at l med abdomen above the tummy tuck scar and L lat abdomen above the tummy tuck scar/).    The following portions of the chart were reviewed this encounter and updated as appropriate:       Review of Systems:  No other skin or systemic complaints except as noted in HPI or Assessment and Plan.  Objective  Well appearing patient in no apparent distress; mood and affect are within normal limits.  A focused examination was performed including abdomen. Relevant physical exam findings are noted in the Assessment and Plan.  left lat abdomen above the tummy scar, left med abbomen above tummy tuck scar Well healed scar    Assessment & Plan  Lipoma of upper extremity, unspecified laterality (2) left lat abdomen above the tummy scar; left med abbomen above tummy tuck scar    Encounter for Removal of Sutures - Incision site at the Left med abdomen and left lat abdomen above tummy tuck scar  is clean, dry and intact - Wound cleansed, sutures removed, wound cleansed and steri strips applied.  - Discussed pathology results showing Lipomas   - Patient advised to keep steri-strips dry until they fall off. - Scars remodel for a full year. - Once steri-strips fall off, patient can apply over-the-counter silicone scar cream each night to help with scar remodeling if desired. - Patient advised to call with any concerns or if they notice any new or changing lesions.   Return if symptoms worsen or fail to improve, for as scheduled.  I, Marye Round, CMA, am acting as scribe for [employer] .

## 2021-09-03 NOTE — Telephone Encounter (Signed)
Scheduled for 09/26/21 @ 8:45-[name]

## 2021-09-09 ENCOUNTER — Other Ambulatory Visit: Payer: Self-pay | Admitting: Physician Assistant

## 2021-09-09 DIAGNOSIS — Z0001 Encounter for general adult medical examination with abnormal findings: Secondary | ICD-10-CM

## 2021-09-09 DIAGNOSIS — L601 Onycholysis: Secondary | ICD-10-CM

## 2021-09-09 DIAGNOSIS — Q8500 Neurofibromatosis, unspecified: Secondary | ICD-10-CM

## 2021-09-09 DIAGNOSIS — G43909 Migraine, unspecified, not intractable, without status migrainosus: Secondary | ICD-10-CM

## 2021-09-09 DIAGNOSIS — N644 Mastodynia: Secondary | ICD-10-CM

## 2021-09-09 DIAGNOSIS — K219 Gastro-esophageal reflux disease without esophagitis: Secondary | ICD-10-CM

## 2021-09-09 DIAGNOSIS — G894 Chronic pain syndrome: Secondary | ICD-10-CM

## 2021-09-09 DIAGNOSIS — D509 Iron deficiency anemia, unspecified: Secondary | ICD-10-CM

## 2021-09-09 DIAGNOSIS — Z9882 Breast implant status: Secondary | ICD-10-CM

## 2021-09-09 DIAGNOSIS — E7800 Pure hypercholesterolemia, unspecified: Secondary | ICD-10-CM

## 2021-09-09 DIAGNOSIS — R300 Dysuria: Secondary | ICD-10-CM

## 2021-09-13 NOTE — Progress Notes (Addendum)
Office Visit Note  Patient: Andrea Bernard             Date of Birth: 11-30-1965           MRN: [id number]             PCP: [name], MD Referring: Mylinda Latina, PA* Visit Date: 09/26/2021 Occupation: @GUAROCC @  Subjective:  Pain in joints and positive ANA.   History of Present Illness: Andrea Bernard is a 55 y.o. female seen in in consultation per request of her PCP.  According the patient her symptoms a started about 40 years ago with pain in her bilateral hands.  She states over the years the pain has gotten worse and she has noticed decreased grip strength.  She also has discomfort in her bilateral shoulders.  She states she has bilateral third fourth and fifth trigger finger for which she has seen a sports medicine doctors and had cortisone injections which were helpful.  She also complains of discomfort in her hips, knee joints and her ankles.  She has neck and lower back pain off and on.  She has not noticed any joint swelling.  She states for the last 1 year she has been experiencing increased hair loss.  There is no history of oral ulcers, nasal ulcers, malar rash, Raynaud's phenomenon, photosensitivity or lymphadenopathy.  There is family history of lupus and Sjogren's in her mother.  And MS in her maternal grandmother.  She is gravida 5, para 4, miscarriage 1.  There is no history of DVTs.  Activities of Daily Living:  Patient reports morning stiffness for 1-2 hours.   Patient Reports nocturnal pain.  Difficulty dressing/grooming: Reports Difficulty climbing stairs: Reports Difficulty getting out of chair: Reports Difficulty using hands for taps, buttons, cutlery, and/or writing: Reports  Review of Systems  Constitutional:  Positive for fatigue. Negative for night sweats, weight gain and weight loss.  HENT:  Positive for mouth dryness. Negative for mouth sores, trouble swallowing, trouble swallowing and nose dryness.   Eyes:  Negative for pain, redness,  itching, visual disturbance and dryness.  Respiratory:  Negative for cough, shortness of breath and difficulty breathing.   Cardiovascular:  Negative for chest pain, palpitations, hypertension, irregular heartbeat and swelling in legs/feet.  Gastrointestinal:  Positive for constipation. Negative for blood in stool and diarrhea.  Endocrine: Negative for increased urination.  Genitourinary:  Negative for difficulty urinating and vaginal dryness.  Musculoskeletal:  Positive for joint pain, joint pain and morning stiffness. Negative for joint swelling, myalgias, muscle weakness, muscle tenderness and myalgias.  Skin:  Negative for color change, rash, hair loss, redness, skin tightness, ulcers and sensitivity to sunlight.  Allergic/Immunologic: Negative for susceptible to infections.  Neurological:  Positive for numbness and parasthesias. Negative for headaches, memory loss, night sweats and weakness.  Hematological:  Negative for bruising/bleeding tendency and swollen glands.  Psychiatric/Behavioral:  Positive for sleep disturbance. Negative for depressed mood and confusion. The patient is not nervous/anxious.    PMFS History:  Patient Active Problem List   Diagnosis Date Noted   Respiratory infection 10/25/2020   ANA positive 02/06/2020   Inflammatory polyarthropathy ([place]) 09/18/2019   Neurofibroma of foot 07/03/2019   Migraine syndrome 07/03/2019   Gastroesophageal reflux disease without esophagitis 07/03/2019   Acute upper respiratory infection 06/16/2019   Vaginal yeast infection 03/29/2019   Fever 12/31/2018   Cough 12/31/2018   Unspecified menopausal and perimenopausal disorder 09/15/2018   Fatigue 09/15/2018   Neurofibromatosis ([place])  03/27/2018   Intractable chronic migraine without aura and without status migrainosus 03/03/2018   Acute non-recurrent maxillary sinusitis 02/10/2018   Vasomotor rhinitis 02/10/2018   Chronic pain syndrome 01/05/2018   Long term current use of opiate  analgesic 01/05/2018   Pharmacologic therapy 01/05/2018   Disorder of skeletal system 01/05/2018   Problems influencing health status 01/05/2018   Chronic neck pain (Primary Area of Pain)(right) 01/05/2018   Chronic pain of both hips  [facility] Area of Pain) (L>R) 01/05/2018   Chronic bilateral low back pain without sciatica  (Fourth Area of Pain)) 01/05/2018   Chronic pain of right upper extremity (Secondary Area of Pain) 01/05/2018   Encounter for general adult medical examination with abnormal findings 12/27/2017   Abnormal weight gain 12/27/2017   Menorrhagia with regular cycle 12/27/2017   Dysuria 12/27/2017   Urinary tract infectious disease 10/27/2017   Iron deficiency anemia 06/15/2017   History of neuroblastoma 06/15/2017   Chronic pain 06/15/2017   Angiolipomatosis, familial 02/26/2017   Lipoma of left upper extremity 02/25/2017   Multiple lipomas 02/25/2017   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 Moderate atypia, limited margins free.   GERD (gastroesophageal reflux disease)    Headache    Iron deficiency anemia 06/15/2017   Neurofibromatosis ([place])     Family History  Problem Relation Age of Onset   Anemia Mother    Lupus Mother    Sjogren's syndrome Mother    Rheum arthritis Mother    Schizophrenia Father    Neurofibromatosis Brother    Neurofibromatosis Son    Neurofibromatosis Son    Neurofibromatosis Daughter    Bladder Cancer Neg Hx    Kidney cancer Neg Hx    Breast cancer Neg Hx    Ovarian cancer Neg Hx    Colon cancer Neg Hx    Past Surgical History:  Procedure Laterality Date   AUGMENTATION MAMMAPLASTY Bilateral 12/10/2020   BREAST ENHANCEMENT SURGERY Bilateral 12/04/2020   carpel tunnel release Bilateral    CESAREAN SECTION      x 4   COLONOSCOPY WITH PROPOFOL N/A 09/04/2017   Procedure: COLONOSCOPY WITH PROPOFOL;  Surgeon: [name], MD;  Location[facility] ENDOSCOPY;  Service: Gastroenterology;  Laterality: N/A;   COLONOSCOPY WITH PROPOFOL N/A 12/21/2017   Procedure: COLONOSCOPY WITH PROPOFOL;  Surgeon: [name], MD;  Location: [facility] ENDOSCOPY;  Service: Gastroenterology;  Laterality: N/A;   ESOPHAGOGASTRODUODENOSCOPY (EGD) WITH PROPOFOL N/A 09/04/2017   Procedure: ESOPHAGOGASTRODUODENOSCOPY (EGD) WITH PROPOFOL;  Surgeon: [name], MD;  Location: [facility] ENDOSCOPY;  Service: Gastroenterology;  Laterality: N/A;   HERNIA REPAIR     HERNIA REPAIR  12/04/2020   TUBAL LIGATION     tummy tuck  12/04/2020   removed 20 masses on stomach   TUMOR REMOVAL     x8   Social History   Social History Narrative   Not on file   Immunization History  Administered Date(s) Administered   Tdap 06/20/2020     Objective: Vital Signs: BP 101/63 (BP Location: Right Arm, Patient Position: Sitting, Cuff Size: Normal)   Pulse (!) 56   Ht 5' 4"  (1.626 m)   Wt 172 lb 6.4 oz (78.2 kg)   LMP 12/18/2017   BMI 29.59 kg/m    Physical Exam Vitals and  nursing note reviewed.  Constitutional:      Appearance: She is well-developed.  HENT:     Head: Normocephalic and atraumatic.  Eyes:     Conjunctiva/sclera: Conjunctivae normal.  Cardiovascular:     Rate and Rhythm: Normal rate and regular rhythm.     Heart sounds: Normal heart sounds.  Pulmonary:     Effort: Pulmonary effort is normal.     Breath sounds: Normal breath sounds.  Abdominal:     General: Bowel sounds are normal.     Palpations: Abdomen is soft.  Musculoskeletal:     Cervical back: Normal range of motion.  Lymphadenopathy:     Cervical: No cervical adenopathy.  Skin:    General: Skin is warm and dry.     Capillary Refill: Capillary refill takes less than 2 seconds.  Neurological:     Mental Status: She is alert and oriented to person, place, and time.   Psychiatric:        Behavior: Behavior normal.     Musculoskeletal Exam: She has some stiffness with range of motion of her cervical spine.  Thoracic and lumbar spine were in fairly good range of motion.  She had some discomfort in the lower lumbar region due to neurofibromas.  Shoulder joints and elbow joints in good range of motion.  She good range of motion of her wrist joints.  Bilateral PIP and DIP thickening with no synovitis was noted.  She has incomplete fist formation due to severe osteoarthritis.  Flexor tendon thickening was noted in bilateral hands.  She had good range of motion of her hip joints.  She had tenderness over bilateral trochanteric bursa.  Knee joints with good range of motion without any warmth swelling or effusion.  There was no tenderness over ankles or MTPs.  CDAI Exam: CDAI Score: -- Patient Global: --; Provider Global: -- Swollen: --; Tender: -- Joint Exam 09/26/2021   No joint exam has been documented for this visit   There is currently no information documented on the homunculus. Go to the Rheumatology activity and complete the homunculus joint exam.  Investigation: No additional findings.  Imaging: No results found.  Recent Labs: Lab Results  Component Value Date   WBC 6.0 02/12/2021   HGB 10.6 (L) 02/12/2021   PLT 199 02/12/2021   NA 139 02/12/2021   K 4.1 02/12/2021   CL 107 02/12/2021   CO2 23 02/12/2021   GLUCOSE 112 (H) 02/12/2021   BUN 16 02/12/2021   CREATININE 0.89 02/12/2021   BILITOT 0.7 02/12/2021   ALKPHOS 64 02/12/2021   AST 34 02/12/2021   ALT 16 02/12/2021   PROT 6.5 02/12/2021   ALBUMIN 3.7 02/12/2021   CALCIUM 8.6 (L) 02/12/2021   GFRAA 86 07/29/2019    Speciality Comments: No specialty comments available.  Procedures:  No procedures performed Allergies: Nitrofuran derivatives   Assessment / Plan:     Visit Diagnoses: Polyarthralgia-she complains of pain and discomfort in multiple joints over the years.  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  Pain in both hands -she complains of pain and discomfort in her bilateral hands for about 4 years.  She states that symptoms are gradually getting worse with decreased grip strength.  She had bilateral PIP and DIP thickening with no synovitis.  She had incomplete fist formation.  She will benefit from PT and OT.  I will make a referral for her.  Some of the exercises were demonstrated in  the office.  Plan: XR Hand 2 View Right, XR Hand 2 View Left, x-rays of bilateral hands were consistent with osteoarthritis.  Ambulatory referral to Physical Therapy  Trigger finger, unspecified finger, unspecified laterality-patient gives history of bilateral third fourth and fifth trigger fingers.  She states she has had 4 cortisone injections by sports medicine in [place].  I believe is related to the manual work.  She will need surgical correction in the future.  She had right carpal tunnel release in the past.  Trochanteric bursitis of both hips -she complains of discomfort in her hips.  She had tenderness over bilateral trochanteric bursa consistent with trochanteric bursitis.  I gave her a handout on IT band stretches.  Plan: Ambulatory referral to Physical Therapy  Chronic pain of both knees-she complains of knee joint discomfort.  No warmth swelling or effusion was noted.  ANA positive -she has positive ANA.  There is no history of oral ulcers, nasal ulcers, malar rash, photosensitivity, Raynaud's phenomenon or lymphadenopathy.  I will obtain [facility] labs to complete the work-up.  02/03/20: ANA+, dsDNA<1, RNP 1.5, Sm-, scl-70-, Ro-, La-, chromatin ab-, anti-JO1-, RF<10, ESR 2, uric acid 3.9. 02/14/21: CK 79, ESR 7. 06/26/20:uric acid 4.5  Chronic pain syndrome-she has history of chronic pain due to neurofibromatosis.  She had several tumors resected recently.  She is on pain medications due to neurofibromatosis.  Intractable chronic migraine without aura  and without status migrainosus-she states that her migraine symptoms are manageable.  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  She has several family members with neurofibromatosis including her children.  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   Family history of systemic lupus erythematosus -according to the patient her mother has history of systemic lupus, Sjogren's and rheumatoid arthritis.  Family history of multiple sclerosis - Maternal grandmother  Orders: Orders Placed This Encounter  Procedures   XR Hand 2 View Right   XR Hand 2 View Left   Ambulatory referral to Physical Therapy   No orders of the defined types were placed in this encounter.    Follow-Up Instructions: Return for pain in joints, +ANA.   Bo Merino, MD  Note - This record has been created using Editor, commissioning.  Chart creation errors have been sought, but may not always  have been located. Such creation errors do not reflect on  the standard of medical care.

## 2021-09-17 ENCOUNTER — Other Ambulatory Visit: Payer: Self-pay

## 2021-09-17 ENCOUNTER — Ambulatory Visit (INDEPENDENT_AMBULATORY_CARE_PROVIDER_SITE_OTHER): Payer: BC Managed Care – PPO | Admitting: Dermatology

## 2021-09-17 DIAGNOSIS — D492 Neoplasm of unspecified behavior of bone, soft tissue, and skin: Secondary | ICD-10-CM

## 2021-09-17 DIAGNOSIS — D171 Benign lipomatous neoplasm of skin and subcutaneous tissue of trunk: Secondary | ICD-10-CM | POA: Diagnosis not present

## 2021-09-17 DIAGNOSIS — L820 Inflamed seborrheic keratosis: Secondary | ICD-10-CM | POA: Diagnosis not present

## 2021-09-17 MED ORDER — DOXYCYCLINE HYCLATE 100 MG PO CAPS: 100.0000 mg | ORAL_CAPSULE | Freq: Two times a day (BID) | ORAL | 0 refills | Status: DC

## 2021-09-17 NOTE — Progress Notes (Signed)
Follow-Up Visit   Subjective  Andrea Bernard is a 55 y.o. female who presents for the following: Lipoma vs other (R abdomen, multiple, pt presents for excision x 4 today) and check spot (L upper eyelid, 24m, gets crusty and irritated.  Patient would like the spot on the eyelid treated.).  The following portions of the chart were reviewed this encounter and updated as appropriate:   Tobacco   Allergies   Meds   Problems   Med Hx   Surg Hx   Fam Hx      Review of Systems:  No other skin or systemic complaints except as noted in HPI or Assessment and Plan.  Objective  Well appearing patient in no apparent distress; mood and affect are within normal limits.  A focused examination was performed including abdomen. Relevant physical exam findings are noted in the Assessment and Plan.  R lat abdomen superior Rubbery nodule 2.5cm  R lat abdomen middle superior Rubbery nodule 2.5cm  R lat abdomen middle inferior 2.5cm rubbery nodule  R lat abdomen inferior 2.5cm rubbery nodule  L upper eyelid x 1, Total = 1 Erythematous keratotic or waxy stuck-on papule or plaque.    Assessment & Plan  Neoplasm of skin (4) R lat abdomen superior Skin excision  Lesion length (cm):  2.5 Lesion width (cm):  2.5 Margin per side (cm):  0 Total excision diameter (cm):  2.5 Informed consent: discussed and consent obtained   Timeout: patient name, date of birth, surgical site, and procedure verified   Procedure prep:  Patient was prepped and draped in usual sterile fashion Prep type:  Isopropyl alcohol and povidone-iodine Anesthesia: the lesion was anesthetized in a standard fashion   Anesthetic:  1% lidocaine w/ epinephrine 1-100,000 buffered w/ 8.4% NaHCO3 (lido w/ epi 1.5cc, bupivicaine 1.5cc, Total of 3cc) Instrument used: #15 blade   Hemostasis achieved with: pressure   Hemostasis achieved with comment:  Electrocautery Outcome: patient tolerated procedure well with no complications    Post-procedure details: sterile dressing applied and wound care instructions given   Dressing type: bandage and pressure dressing (Mupirocin)    Skin repair Complexity:  Complex Final length (cm):  1 Reason for type of repair: reduce tension to allow closure, reduce the risk of dehiscence, infection, and necrosis, reduce subcutaneous dead space and avoid a hematoma, allow closure of the large defect, preserve normal anatomy, preserve normal anatomical and functional relationships and enhance both functionality and cosmetic results   Undermining: area extensively undermined   Undermining comment:  Undermining Defect 2.5cm Subcutaneous layers (deep stitches):  Suture size:  3-0 Suture type: Vicryl (polyglactin 910)   Subcutaneous suture technique: Inverted Dermal. Fine/surface layer approximation (top stitches):  Suture size:  3-0 Suture type: nylon   Stitches: horizontal mattress   Suture removal (days):  7 Hemostasis achieved with: pressure Outcome: patient tolerated procedure well with no complications   Post-procedure details: sterile dressing applied and wound care instructions given   Dressing type: bandage, pressure dressing and bacitracin (Mupirocin)    doxycycline (VIBRAMYCIN) 100 MG capsule Take 1 capsule (100 mg total) by mouth 2 (two) times daily. With food and plenty of fluid  Specimen 1 - Surgical pathology Differential Diagnosis: D48.5 [place] vs other  Check Margins: No 2.5cm rubbery nodule  R lat abdomen middle superior Skin excision  Lesion length (cm):  2.5 Lesion width (cm):  2.5 Margin per side (cm):  0 Total excision diameter (cm):  2.5 Informed consent: discussed and consent obtained  Timeout: patient name, date of birth, surgical site, and procedure verified   Procedure prep:  Patient was prepped and draped in usual sterile fashion Prep type:  Isopropyl alcohol and povidone-iodine Anesthesia: the lesion was anesthetized in a standard fashion    Anesthetic:  1% lidocaine w/ epinephrine 1-100,000 buffered w/ 8.4% NaHCO3 (lido w/ epi 1.5cc, bupivicaine 1.5cc, Total = 3cc) Instrument used: #15 blade   Hemostasis achieved with: pressure   Hemostasis achieved with comment:  Electrocautery Outcome: patient tolerated procedure well with no complications   Post-procedure details: sterile dressing applied and wound care instructions given   Dressing type: bandage and pressure dressing (Mupirocin)    Skin repair Complexity:  Complex Final length (cm):  1 Reason for type of repair: reduce tension to allow closure, reduce the risk of dehiscence, infection, and necrosis, reduce subcutaneous dead space and avoid a hematoma, allow closure of the large defect, preserve normal anatomy, preserve normal anatomical and functional relationships and enhance both functionality and cosmetic results   Undermining: area extensively undermined   Undermining comment:  Undermining Defect 2.5cm Subcutaneous layers (deep stitches):  Suture size:  3-0 Suture type: Vicryl (polyglactin 910)   Subcutaneous suture technique: Inverted Dermal. Fine/surface layer approximation (top stitches):  Suture size:  3-0 Suture type: nylon   Stitches: horizontal mattress   Suture removal (days):  7 Hemostasis achieved with: pressure Outcome: patient tolerated procedure well with no complications   Post-procedure details: sterile dressing applied and wound care instructions given   Dressing type: bandage, pressure dressing and bacitracin (Mupirocin)    Specimen 2 - Surgical pathology Differential Diagnosis: D48.5 Lipoma vs other  Check Margins: No Rubbery nodule 2.5cm  R lat abdomen middle inferio Skin excision  Lesion length (cm):  2.5 Lesion width (cm):  2.5 Margin per side (cm):  0 Total excision diameter (cm):  2.5 Informed consent: discussed and consent obtained   Timeout: patient name, date of birth, surgical site, and procedure verified   Procedure prep:   Patient was prepped and draped in usual sterile fashion Prep type:  Isopropyl alcohol and povidone-iodine Anesthesia: the lesion was anesthetized in a standard fashion   Anesthetic:  1% lidocaine w/ epinephrine 1-100,000 buffered w/ 8.4% NaHCO3 (lido w/ epi 1.5cc, bupivicaine 1.5cc, Total = 3cc) Instrument used: #15 blade   Hemostasis achieved with: pressure   Hemostasis achieved with comment:  Electrocautery Outcome: patient tolerated procedure well with no complications   Post-procedure details: sterile dressing applied and wound care instructions given   Dressing type: bandage and pressure dressing (Mupirocin)    Skin repair Complexity:  Complex Final length (cm):  1 Reason for type of repair: reduce tension to allow closure, reduce the risk of dehiscence, infection, and necrosis, reduce subcutaneous dead space and avoid a hematoma, allow closure of the large defect, preserve normal anatomy, preserve normal anatomical and functional relationships and enhance both functionality and cosmetic results   Undermining: area extensively undermined   Undermining comment:  Undermining Defect 2.5cm Subcutaneous layers (deep stitches):  Suture size:  3-0 Suture type: Vicryl (polyglactin 910)   Subcutaneous suture technique: Inverted Dermal. Fine/surface layer approximation (top stitches):  Suture size:  3-0 Suture type: nylon   Stitches: horizontal mattress   Suture removal (days):  7 Hemostasis achieved with: pressure Outcome: patient tolerated procedure well with no complications   Post-procedure details: sterile dressing applied and wound care instructions given   Dressing type: bandage, pressure dressing and bacitracin (Mupirocin)    Specimen 3 - Surgical pathology  Differential Diagnosis: D48.5 Lipoma vs othe4  Check Margins: No 2.5cm rubbery nodule  R lat abdomen inferior Skin excision  Lesion length (cm):  2.5 Lesion width (cm):  2.5 Margin per side (cm):  0 Total excision  diameter (cm):  2.5 Informed consent: discussed and consent obtained   Timeout: patient name, date of birth, surgical site, and procedure verified   Procedure prep:  Patient was prepped and draped in usual sterile fashion Prep type:  Isopropyl alcohol and povidone-iodine Anesthesia: the lesion was anesthetized in a standard fashion   Anesthetic:  1% lidocaine w/ epinephrine 1-100,000 buffered w/ 8.4% NaHCO3 (lido w/ epi 1.5cc, bupivicaine 1.5cc, Total = 3cc) Instrument used: #15 blade   Hemostasis achieved with: pressure   Hemostasis achieved with comment:  Electrocautery Outcome: patient tolerated procedure well with no complications   Post-procedure details: sterile dressing applied and wound care instructions given   Dressing type: bandage and pressure dressing (Mupirocin)    Skin repair Complexity:  Complex Final length (cm):  1 Reason for type of repair: reduce tension to allow closure, reduce the risk of dehiscence, infection, and necrosis, reduce subcutaneous dead space and avoid a hematoma, allow closure of the large defect, preserve normal anatomy, preserve normal anatomical and functional relationships and enhance both functionality and cosmetic results   Undermining: area extensively undermined   Undermining comment:  Undermining Defect 2.5cm Subcutaneous layers (deep stitches):  Suture size:  3-0 Suture type: Vicryl (polyglactin 910)   Subcutaneous suture technique: Inverted Dermal. Fine/surface layer approximation (top stitches):  Suture size:  4-0 Suture type: nylon   Stitches: horizontal mattress   Suture removal (days):  7 Hemostasis achieved with: pressure Outcome: patient tolerated procedure well with no complications   Post-procedure details: sterile dressing applied and wound care instructions given   Dressing type: bandage, pressure dressing and bacitracin (Mupirocin)    Specimen 4 - Surgical pathology Differential Diagnosis: D48.5 Lipoma vs other  Check  Margins: No 2.5cm rubbery nodule Lipomas vs other Start Mupirocin oint qd to excision sites  Doxycycline 100 mg 1 po bid with food and plenty of fluid x 7 days  Related Medications mupirocin ointment (BACTROBAN) 2 % Apply 1 application topically daily. Qd to excision site  Inflamed seborrheic keratosis L upper eyelid margin x 1, Total = 1  Destruction of lesion - L upper eyelid margin x 1, Total = 1 Complexity: simple   Destruction method: cryotherapy   Informed consent: discussed and consent obtained   Lesion destroyed using liquid nitrogen: Yes   Region frozen until ice ball extended beyond lesion: Yes   Outcome: patient tolerated procedure well with no complications   Post-procedure details: wound care instructions given    Return in about 1 week (around 09/24/2021).  I, Othelia Pulling, RMA, am acting as scribe for Sarina Ser, MD . Documentation: I have reviewed the above documentation for accuracy and completeness, and I agree with the above.  Sarina Ser, MD

## 2021-09-17 NOTE — Patient Instructions (Signed)

## 2021-09-18 ENCOUNTER — Telehealth: Payer: Self-pay

## 2021-09-18 NOTE — Telephone Encounter (Signed)
Pt doing fine after yesterdays surgery./sh 

## 2021-09-22 ENCOUNTER — Encounter: Payer: Self-pay | Admitting: Dermatology

## 2021-09-24 ENCOUNTER — Other Ambulatory Visit: Payer: Self-pay

## 2021-09-24 ENCOUNTER — Ambulatory Visit (INDEPENDENT_AMBULATORY_CARE_PROVIDER_SITE_OTHER): Payer: BC Managed Care – PPO | Admitting: Dermatology

## 2021-09-24 DIAGNOSIS — D171 Benign lipomatous neoplasm of skin and subcutaneous tissue of trunk: Secondary | ICD-10-CM

## 2021-09-24 DIAGNOSIS — D489 Neoplasm of uncertain behavior, unspecified: Secondary | ICD-10-CM

## 2021-09-24 DIAGNOSIS — D1723 Benign lipomatous neoplasm of skin and subcutaneous tissue of right leg: Secondary | ICD-10-CM | POA: Diagnosis not present

## 2021-09-24 NOTE — Progress Notes (Signed)
Follow-Up Visit   Subjective  Andrea Bernard is a 55 y.o. female who presents for the following: Follow-up (Post op - Lipoma x 4 of right abdomen) and Other (Lipoma of right hip that is painful - Excise today).  The following portions of the chart were reviewed this encounter and updated as appropriate:   Tobacco   Allergies   Meds   Problems   Med Hx   Surg Hx   Fam Hx      Review of Systems:  No other skin or systemic complaints except as noted in HPI or Assessment and Plan.  Objective  Well appearing patient in no apparent distress; mood and affect are within normal limits.  A focused examination was performed including right hip. Relevant physical exam findings are noted in the Assessment and Plan.  Right ant hip Rubbery nodule 3.5 x 2.0 cm  Right lat abdomen inf, Right lat abdomen middle inf, Right lat abdomen middle sup, Right lat abdomen sup Healing excisions sites.   Assessment & Plan  Neoplasm of uncertain behavior Right ant hip  Skin excision  Lesion length (cm):  3.5 Lesion width (cm):  1.2 Margin per side (cm):  0 Total excision diameter (cm):  3.5 Informed consent: discussed and consent obtained   Timeout: patient name, date of birth, surgical site, and procedure verified   Procedure prep:  Patient was prepped and draped in usual sterile fashion Prep type:  Isopropyl alcohol and povidone-iodine Anesthesia: the lesion was anesthetized in a standard fashion   Anesthetic:  1% lidocaine w/ epinephrine 1-100,000 buffered w/ 8.4% NaHCO3 Instrument used: #15 blade   Hemostasis achieved with: pressure   Hemostasis achieved with comment:  Electrocautery Outcome: patient tolerated procedure well with no complications   Post-procedure details: sterile dressing applied and wound care instructions given   Dressing type: bandage and pressure dressing (mupirocin)    Skin repair Complexity:  Complex Final length (cm):  2.7 Reason for type of repair: reduce tension  to allow closure, reduce the risk of dehiscence, infection, and necrosis, reduce subcutaneous dead space and avoid a hematoma, allow closure of the large defect, preserve normal anatomy, preserve normal anatomical and functional relationships and enhance both functionality and cosmetic results   Undermining: area extensively undermined   Undermining comment:  Undermining defect 3.5 cm Subcutaneous layers (deep stitches):  Suture size:  2-0 Suture type: Vicryl (polyglactin 910)   Subcutaneous suture technique: inverted dermal. Fine/surface layer approximation (top stitches):  Suture size:  3-0 Suture type: nylon   Stitches: simple running   Suture removal (days):  7 Hemostasis achieved with: suture and pressure Outcome: patient tolerated procedure well with no complications   Post-procedure details: sterile dressing applied and wound care instructions given   Dressing type: bandage and pressure dressing (mupirocin)    Specimen 1 - Surgical pathology Differential Diagnosis: Lipoma vs other Check Margins: No  Lipoma of torso (4) Right lat abdomen sup; Right lat abdomen inf; Right lat abdomen middle inf; Right lat abdomen middle sup  Encounter for Removal of Sutures - Incision sites at the right lat abdomen sup, right lat abdomen middle sup, right lat abdomen middle inf, right lat abdomen inf are clean, dry and intact - Wound cleansed, sutures removed, wound cleansed and steri strips applied.  - Discussed pathology results showing lipoma x 4  - Patient advised to keep steri-strips dry until they fall off. - Scars remodel for a full year. - Once steri-strips fall off, patient can apply  over-the-counter silicone scar cream each night to help with scar remodeling if desired. - Patient advised to call with any concerns or if they notice any new or changing lesions.    Return in about 1 week (around 10/01/2021) for suture removal.  I, Ashok Cordia, CMA, am acting as scribe for Sarina Ser, MD . Documentation: I have reviewed the above documentation for accuracy and completeness, and I agree with the above.  Sarina Ser, MD

## 2021-09-24 NOTE — Patient Instructions (Signed)

## 2021-09-25 ENCOUNTER — Telehealth: Payer: Self-pay

## 2021-09-25 NOTE — Telephone Encounter (Signed)
Pt doing fine after yesterdays surgery./sh 

## 2021-09-26 ENCOUNTER — Ambulatory Visit: Payer: Self-pay

## 2021-09-26 ENCOUNTER — Encounter: Payer: Self-pay | Admitting: Rheumatology

## 2021-09-26 ENCOUNTER — Ambulatory Visit (INDEPENDENT_AMBULATORY_CARE_PROVIDER_SITE_OTHER): Payer: BC Managed Care – PPO | Admitting: Rheumatology

## 2021-09-26 ENCOUNTER — Other Ambulatory Visit: Payer: Self-pay

## 2021-09-26 VITALS — BP 101/63 | HR 56 | Ht 64.0 in | Wt 172.4 lb

## 2021-09-26 DIAGNOSIS — M6530 Trigger finger, unspecified finger: Secondary | ICD-10-CM | POA: Diagnosis not present

## 2021-09-26 DIAGNOSIS — Z820 Family history of epilepsy and other diseases of the nervous system: Secondary | ICD-10-CM

## 2021-09-26 DIAGNOSIS — M79641 Pain in right hand: Secondary | ICD-10-CM | POA: Diagnosis not present

## 2021-09-26 DIAGNOSIS — Z79891 Long term (current) use of opiate analgesic: Secondary | ICD-10-CM

## 2021-09-26 DIAGNOSIS — M2550 Pain in unspecified joint: Secondary | ICD-10-CM | POA: Diagnosis not present

## 2021-09-26 DIAGNOSIS — G894 Chronic pain syndrome: Secondary | ICD-10-CM

## 2021-09-26 DIAGNOSIS — M79642 Pain in left hand: Secondary | ICD-10-CM | POA: Diagnosis not present

## 2021-09-26 DIAGNOSIS — M25561 Pain in right knee: Secondary | ICD-10-CM

## 2021-09-26 DIAGNOSIS — M7062 Trochanteric bursitis, left hip: Secondary | ICD-10-CM

## 2021-09-26 DIAGNOSIS — M064 Inflammatory polyarthropathy: Secondary | ICD-10-CM

## 2021-09-26 DIAGNOSIS — Q8501 Neurofibromatosis, type 1: Secondary | ICD-10-CM

## 2021-09-26 DIAGNOSIS — G8929 Other chronic pain: Secondary | ICD-10-CM

## 2021-09-26 DIAGNOSIS — M7061 Trochanteric bursitis, right hip: Secondary | ICD-10-CM

## 2021-09-26 DIAGNOSIS — C7230 Malignant neoplasm of unspecified optic nerve: Secondary | ICD-10-CM

## 2021-09-26 DIAGNOSIS — D179 Benign lipomatous neoplasm, unspecified: Secondary | ICD-10-CM

## 2021-09-26 DIAGNOSIS — Z8269 Family history of other diseases of the musculoskeletal system and connective tissue: Secondary | ICD-10-CM

## 2021-09-26 DIAGNOSIS — G43719 Chronic migraine without aura, intractable, without status migrainosus: Secondary | ICD-10-CM

## 2021-09-26 DIAGNOSIS — M25562 Pain in left knee: Secondary | ICD-10-CM

## 2021-09-26 DIAGNOSIS — Z85858 Personal history of malignant neoplasm of other endocrine glands: Secondary | ICD-10-CM

## 2021-09-26 DIAGNOSIS — K219 Gastro-esophageal reflux disease without esophagitis: Secondary | ICD-10-CM

## 2021-09-26 DIAGNOSIS — R768 Other specified abnormal immunological findings in serum: Secondary | ICD-10-CM

## 2021-09-26 NOTE — Patient Instructions (Addendum)
Hand Exercises Hand exercises can be helpful for almost anyone. These exercises can strengthen the hands, improve flexibility and movement, and increase blood flow to the hands. These results can make work and daily tasks easier. Hand exercises can be especially helpful for people who have joint pain from arthritis or have nerve damage from overuse (carpal tunnel syndrome). These exercises can also help people who have injured a hand. Exercises Most of these hand exercises are gentle stretching and motion exercises. It is usually safe to do them often throughout the day. Warming up your hands before exercise may help to reduce stiffness. You can do this with gentle massage or by placing your hands in warm water for 10-15 minutes. It is normal to feel some stretching, pulling, tightness, or mild discomfort as you begin new exercises. This will gradually improve. Stop an exercise right away if you feel sudden, severe pain or your pain gets worse. Ask your health care provider which exercises are best for you. Knuckle bend or "claw" fist  Stand or sit with your arm, hand, and all five fingers pointed straight up. Make sure to keep your wrist straight during the exercise. Gently bend your fingers down toward your palm until the tips of your fingers are touching the top of your palm. Keep your big knuckle straight and just bend the small knuckles in your fingers. Hold this position for __________ seconds. Straighten (extend) your fingers back to the starting position. Repeat this exercise 5-10 times with each hand. Full finger fist  Stand or sit with your arm, hand, and all five fingers pointed straight up. Make sure to keep your wrist straight during the exercise. Gently bend your fingers into your palm until the tips of your fingers are touching the middle of your palm. Hold this position for __________ seconds. Extend your fingers back to the starting position, stretching every joint fully. Repeat  this exercise 5-10 times with each hand. Straight fist Stand or sit with your arm, hand, and all five fingers pointed straight up. Make sure to keep your wrist straight during the exercise. Gently bend your fingers at the big knuckle, where your fingers meet your hand, and the middle knuckle. Keep the knuckle at the tips of your fingers straight and try to touch the bottom of your palm. Hold this position for __________ seconds. Extend your fingers back to the starting position, stretching every joint fully. Repeat this exercise 5-10 times with each hand. Tabletop  Stand or sit with your arm, hand, and all five fingers pointed straight up. Make sure to keep your wrist straight during the exercise. Gently bend your fingers at the big knuckle, where your fingers meet your hand, as far down as you can while keeping the small knuckles in your fingers straight. Think of forming a tabletop with your fingers. Hold this position for __________ seconds. Extend your fingers back to the starting position, stretching every joint fully. Repeat this exercise 5-10 times with each hand. Finger spread  Place your hand flat on a table with your palm facing down. Make sure your wrist stays straight as you do this exercise. Spread your fingers and thumb apart from each other as far as you can until you feel a gentle stretch. Hold this position for __________ seconds. Bring your fingers and thumb tight together again. Hold this position for __________ seconds. Repeat this exercise 5-10 times with each hand. Making circles  Stand or sit with your arm, hand, and all five fingers pointed  straight up. Make sure to keep your wrist straight during the exercise. Make a circle by touching the tip of your thumb to the tip of your index finger. Hold for __________ seconds. Then open your hand wide. Repeat this motion with your thumb and each finger on your hand. Repeat this exercise 5-10 times with each hand. Thumb  motion  Sit with your forearm resting on a table and your wrist straight. Your thumb should be facing up toward the ceiling. Keep your fingers relaxed as you move your thumb. Lift your thumb up as high as you can toward the ceiling. Hold for __________ seconds. Bend your thumb across your palm as far as you can, reaching the tip of your thumb for the small finger (pinkie) side of your palm. Hold for __________ seconds. Repeat this exercise 5-10 times with each hand. Grip strengthening  Hold a stress ball or other soft ball in the middle of your hand. Slowly increase the pressure, squeezing the ball as much as you can without causing pain. Think of bringing the tips of your fingers into the middle of your palm. All of your finger joints should bend when doing this exercise. Hold your squeeze for __________ seconds, then relax. Repeat this exercise 5-10 times with each hand. Contact a health care provider if: Your hand pain or discomfort gets much worse when you do an exercise. Your hand pain or discomfort does not improve within 2 hours after you exercise. If you have any of these problems, stop doing these exercises right away. Do not do them again unless your health care provider says that you can. Get help right away if: You develop sudden, severe hand pain or swelling. If this happens, stop doing these exercises right away. Do not do them again unless your health care provider says that you can. This information is not intended to replace advice given to you by your health care provider. Make sure you discuss any questions you have with your health care provider. Document Revised: 01/10/2021 Document Reviewed: 01/10/2021 Elsevier Patient Education  [place] [facility] Ask your health care provider which exercises are safe for you. Do exercises exactly as told by your health care provider and adjust them as directed. It is normal to feel mild stretching,  pulling, tightness, or discomfort as you do these exercises. Stop right away if you feel sudden pain or your pain gets significantly worse. Do not begin these exercises until told by your health care provider. Stretching and range-of-motion exercises These exercises warm up your muscles and joints and improve the movement and flexibility of your hip and pelvis. Quadriceps stretch, prone  Lie on your abdomen (prone position) on a firm surface, such as a bed or padded floor. Bend your left / right knee and reach back to hold your ankle or pant leg. If you cannot reach your ankle or pant leg, loop a belt around your foot and grab the belt instead. Gently pull your heel toward your buttocks. Your knee should not slide out to the side. You should feel a stretch in the front of your thigh and knee (quadriceps). Hold this position for __________ seconds. Repeat __________ times. Complete this exercise __________ times a day. Iliotibial band stretch An iliotibial band is a strong band of muscle tissue that runs from the outer side of your hip to the outer side of your thigh and knee. Lie on your side with your left / right leg in the  top position. Bend both of your knees and grab your left / right ankle. Stretch out your bottom arm to help you balance. Slowly bring your top knee back so your thigh goes behind your trunk. Slowly lower your top leg toward the floor until you feel a gentle stretch on the outside of your left / right hip and thigh. If you do not feel a stretch and your knee will not fall farther, place the heel of your other foot on top of your knee and pull your knee down toward the floor with your foot. Hold this position for __________ seconds. Repeat __________ times. Complete this exercise __________ times a day. Strengthening exercises These exercises build strength and endurance in your hip and pelvis. Endurance is the ability to use your muscles for a long time, even after they get  tired. Straight leg raises, side-lying This exercise strengthens the muscles that rotate the leg at the hip and move it away from your body (hip abductors). Lie on your side with your left / right leg in the top position. Lie so your head, shoulder, hip, and knee line up. You may bend your bottom knee to help you balance. Roll your hips slightly forward so your hips are stacked directly over each other and your left / right knee is facing forward. Tense the muscles in your outer thigh and lift your top leg 4-6 inches (10-15 cm). Hold this position for __________ seconds. Slowly lower your leg to return to the starting position. Let your muscles relax completely before doing another repetition. Repeat __________ times. Complete this exercise __________ times a day. Leg raises, prone This exercise strengthens the muscles that move the hips backward (hip extensors). Lie on your abdomen (prone position) on your bed or a firm surface. You can put a pillow under your hips if that is more comfortable for your lower back. Bend your left / right knee so your foot is straight up in the air. Squeeze your buttocks muscles and lift your left / right thigh off the bed. Do not let your back arch. Tense your thigh muscle as hard as you can without increasing any knee pain. Hold this position for __________ seconds. Slowly lower your leg to return to the starting position and allow it to relax completely. Repeat __________ times. Complete this exercise __________ times a day. Hip hike Stand sideways on a bottom step. Stand on your left / right leg with your other foot unsupported next to the step. You can hold on to a railing or wall for balance if needed. Keep your knees straight and your torso square. Then lift your left / right hip up toward the ceiling. Slowly let your left / right hip lower toward the floor, past the starting position. Your foot should get closer to the floor. Do not lean or bend your  knees. Repeat __________ times. Complete this exercise __________ times a day. This information is not intended to replace advice given to you by your health care provider. Make sure you discuss any questions you have with your health care provider. Document Revised: 11/30/2019 Document Reviewed: 11/30/2019 Elsevier Patient Education  [place].

## 2021-09-27 ENCOUNTER — Other Ambulatory Visit: Payer: Self-pay | Admitting: Physician Assistant

## 2021-09-27 DIAGNOSIS — K219 Gastro-esophageal reflux disease without esophagitis: Secondary | ICD-10-CM

## 2021-09-27 DIAGNOSIS — D509 Iron deficiency anemia, unspecified: Secondary | ICD-10-CM

## 2021-09-27 DIAGNOSIS — Z0001 Encounter for general adult medical examination with abnormal findings: Secondary | ICD-10-CM

## 2021-09-27 DIAGNOSIS — G43909 Migraine, unspecified, not intractable, without status migrainosus: Secondary | ICD-10-CM

## 2021-09-27 DIAGNOSIS — L601 Onycholysis: Secondary | ICD-10-CM

## 2021-09-27 DIAGNOSIS — Q8500 Neurofibromatosis, unspecified: Secondary | ICD-10-CM

## 2021-09-27 DIAGNOSIS — N644 Mastodynia: Secondary | ICD-10-CM

## 2021-09-27 DIAGNOSIS — R300 Dysuria: Secondary | ICD-10-CM

## 2021-09-27 DIAGNOSIS — Z9882 Breast implant status: Secondary | ICD-10-CM

## 2021-09-27 DIAGNOSIS — G894 Chronic pain syndrome: Secondary | ICD-10-CM

## 2021-09-27 DIAGNOSIS — E7800 Pure hypercholesterolemia, unspecified: Secondary | ICD-10-CM

## 2021-10-02 ENCOUNTER — Telehealth: Payer: Self-pay | Admitting: *Deleted

## 2021-10-02 ENCOUNTER — Ambulatory Visit: Payer: BC Managed Care – PPO

## 2021-10-02 NOTE — Telephone Encounter (Signed)
I called patient, previously drawn AV[facility] lab specimen was unable to be delivered to lab due to weather, labs need to be redrawn, patient verbalized understanding, patient requested AVISE lab form mailed to her.

## 2021-10-03 ENCOUNTER — Telehealth: Payer: Self-pay

## 2021-10-03 NOTE — Telephone Encounter (Signed)
Discussed biopsy results with pt  °

## 2021-10-03 NOTE — Telephone Encounter (Signed)
-----   Message from Ralene Bathe, MD sent at 09/30/2021  8:03 PM EST ----- Diagnosis Skin (M), right ant hip EXCISION, LIPOMA  Benign lipoma

## 2021-10-04 ENCOUNTER — Telehealth (INDEPENDENT_AMBULATORY_CARE_PROVIDER_SITE_OTHER): Payer: BC Managed Care – PPO | Admitting: Nurse Practitioner

## 2021-10-04 ENCOUNTER — Encounter: Payer: Self-pay | Admitting: Nurse Practitioner

## 2021-10-04 VITALS — Resp 16 | Ht 64.0 in | Wt 164.0 lb

## 2021-10-04 DIAGNOSIS — R062 Wheezing: Secondary | ICD-10-CM

## 2021-10-04 DIAGNOSIS — J0100 Acute maxillary sinusitis, unspecified: Secondary | ICD-10-CM | POA: Diagnosis not present

## 2021-10-04 DIAGNOSIS — B3731 Acute candidiasis of vulva and vagina: Secondary | ICD-10-CM

## 2021-10-04 MED ORDER — ALBUTEROL SULFATE HFA 108 (90 BASE) MCG/ACT IN AERS: 2.0000 | INHALATION_SPRAY | Freq: Four times a day (QID) | RESPIRATORY_TRACT | 2 refills | Status: DC | PRN

## 2021-10-04 MED ORDER — FLUCONAZOLE 150 MG PO TABS
150.0000 mg | ORAL_TABLET | Freq: Once | ORAL | 0 refills | Status: AC
Start: 2021-10-04 — End: 2021-10-04

## 2021-10-04 MED ORDER — AMOXICILLIN-POT CLAVULANATE 875-125 MG PO TABS: 1.0000 | ORAL_TABLET | Freq: Two times a day (BID) | ORAL | 0 refills | Status: DC

## 2021-10-04 MED ORDER — PREDNISONE 10 MG PO TABS: ORAL_TABLET | ORAL | 0 refills | Status: DC

## 2021-10-04 NOTE — Progress Notes (Signed)
[facility] [place], [place] [address]  Internal MEDICINE  Telephone Visit  Patient Name: Andrea Bernard  [id number]  [id number]  Date of Service: 10/04/2021  I connected with the patient at 10:30 AM by telephone and verified the patients identity using two identifiers.   I discussed the limitations, risks, security and privacy concerns of performing an evaluation and management service by telephone and the availability of in person appointments. I also discussed with the patient that there may be a patient responsible charge related to the service.  The patient expressed understanding and agrees to proceed.    Chief Complaint  Patient presents with   Telephone Assessment    [phone] virtual   Telephone Screen   Nasal Congestion    Symptoms started 09/22/21.  Started top get better then went in chest.  Pt requesting prednisone, 10 day abx, and inhaler states this happens all the time every year.  Had covid a few months ago   Wheezing   Cough    Green mucus in nose   Ear Pain   Sore Throat    2 weeks ago    HPI Andrea Bernard presents for a telehealth virtual visit for symptoms of sinusitis.  Her symptoms started on December 18 and she thought she was getting better but then she got worse.  She reports nasal congestion, wheezing, cough with green sputum, ear pain, sore throat.  She is requesting Prednisone Taper and 10 Days of Antibiotics.     Current Medication: Outpatient Encounter Medications as of 10/04/2021  Medication Sig   albuterol (VENTOLIN HFA) 108 (90 Base) MCG/ACT inhaler Inhale 2 puffs into the lungs every 6 (six) hours as needed for wheezing or shortness of breath.   amoxicillin-clavulanate (AUGMENTIN) 875-125 MG tablet Take 1 tablet by mouth 2 (two) times daily for 10 days.   fluconazole (DIFLUCAN) 150 MG tablet Take 1 tablet (150 mg total) by mouth once for 1 dose. May take an additional dose after 3 days if still symptomatic.    predniSONE (DELTASONE) 10 MG tablet Take one tab 3 x day for 3 days, then take one tab 2 x a day for 3 days and then take one tab a day for 3 days for URI with wheezing   gabapentin (NEURONTIN) 300 MG capsule Take 300 mg by mouth 3 (three) times daily.   ibuprofen (ADVIL) 200 MG tablet Take 200 mg by mouth as needed.   OXYCONTIN 80 MG 12 hr tablet Take 80 mg by mouth every 12 (twelve) hours.   pantoprazole (PROTONIX) 40 MG tablet TAKE 1 TABLET BY MOUTH TWICE A DAY   SUMAtriptan (IMITREX) 100 MG tablet TAKE 1 TABLET BY MOUTH DAILY AS NEEDED FOR MIGRAINE.   [DISCONTINUED] albuterol (VENTOLIN HFA) 108 (90 Base) MCG/ACT inhaler TAKE 2 PUFFS BY MOUTH EVERY 6 HOURS AS NEEDED FOR WHEEZE OR SHORTNESS OF BREATH (Patient not taking: Reported on 09/26/2021)   Facility-Administered Encounter Medications as of 10/04/2021  Medication   iron sucrose (VENOFER) 200 mg IVPB   sodium chloride flush (NS) 0.9 % injection 10 mL    Surgical History: Past Surgical History:  Procedure Laterality Date   AUGMENTATION MAMMAPLASTY Bilateral 12/10/2020   BREAST ENHANCEMENT SURGERY Bilateral 12/04/2020   carpel tunnel release Bilateral    CESAREAN SECTION     x 4   COLONOSCOPY WITH PROPOFOL N/A 09/04/2017   Procedure: COLONOSCOPY WITH PROPOFOL;  Surgeon: [name], MD;  Location: [facility] ENDOSCOPY;  Service: Gastroenterology;  Laterality: N/A;  COLONOSCOPY WITH PROPOFOL N/A 12/21/2017   Procedure: COLONOSCOPY WITH PROPOFOL;  Surgeon: [name], MD;  Location: [facility] ENDOSCOPY;  Service: Gastroenterology;  Laterality: N/A;   ESOPHAGOGASTRODUODENOSCOPY (EGD) WITH PROPOFOL N/A 09/04/2017   Procedure: ESOPHAGOGASTRODUODENOSCOPY (EGD) WITH PROPOFOL;  Surgeon: [name], MD;  Location: [facility] ENDOSCOPY;  Service: Gastroenterology;  Laterality: N/A;   HERNIA REPAIR     HERNIA REPAIR  12/04/2020   TUBAL LIGATION     tummy tuck  12/04/2020   removed 20 masses on stomach   TUMOR REMOVAL     x8    Medical History: Past  Medical History:  Diagnosis Date   Arthritis    Dysplastic nevus 03/02/2008   Right lateral thigh. Moderate atypia, limited margins free.   GERD (gastroesophageal reflux disease)    Headache    Iron deficiency anemia 06/15/2017   Neurofibromatosis ([place])     Family History: Family History  Problem Relation Age of Onset   Anemia Mother    Lupus Mother    Sjogren's syndrome Mother    Rheum arthritis Mother    Schizophrenia Father    Neurofibromatosis Brother    Neurofibromatosis Son    Neurofibromatosis Son    Neurofibromatosis Daughter    Bladder Cancer Neg Hx    Kidney cancer Neg Hx    Breast cancer Neg Hx    Ovarian cancer Neg Hx    Colon cancer Neg Hx     Social History   Socioeconomic History   Marital status: Married    Spouse name: Not on file   Number of children: Not on file   Years of education: Not on file   Highest education level: Not on file  Occupational History   Not on file  Tobacco Use   Smoking status: Former    Types: Cigarettes   Smokeless tobacco: Never   Tobacco comments:    Age [age]  Vaping Use   Vaping Use: Never used  Substance and Sexual Activity   Alcohol use: No   Drug use: Not Currently   Sexual activity: Yes    Birth control/protection: Surgical  Other Topics Concern   Not on file  Social History Narrative   Not on file   Social Determinants of Health   Financial Resource Strain: Not on file  Food Insecurity: Not on file  Transportation Needs: Not on file  Physical Activity: Not on file  Stress: Not on file  Social Connections: Not on file  Intimate Partner Violence: Not on file      Review of Systems  Constitutional:  Negative for chills, fatigue and fever.  HENT:  Positive for congestion, ear pain, postnasal drip, rhinorrhea, sinus pressure, sinus pain and sore throat.   Respiratory:  Positive for cough, chest tightness and wheezing. Negative for shortness of breath.   Cardiovascular: Negative.  Negative for  chest pain and palpitations.  Gastrointestinal: Negative.  Negative for abdominal pain, constipation, diarrhea, nausea and vomiting.  Musculoskeletal: Negative.  Negative for myalgias.  Skin:  Negative for rash.  Neurological:  Positive for headaches. Negative for dizziness and light-headedness.   Vital Signs: Resp 16    Ht 5\' 4"  (1.626 m)    Wt 164 lb (74.4 kg)    LMP 12/18/2017    BMI 28.15 kg/m    Observation/Objective: She is alert and oriented and engages in conversation appropriately.  She does not appear to be in any acute distress over video call.    Assessment/Plan: 1. Acute non-recurrent maxillary sinusitis Empiric  antibiotic treatment prescribed - amoxicillin-clavulanate (AUGMENTIN) 875-125 MG tablet; Take 1 tablet by mouth 2 (two) times daily for 10 days.  Dispense: 20 tablet; Refill: 0  2. Wheezing Prednisone taper and albuterol inhaler prescribed to alleviate wheezing and inflammation - predniSONE (DELTASONE) 10 MG tablet; Take one tab 3 x day for 3 days, then take one tab 2 x a day for 3 days and then take one tab a day for 3 days for URI with wheezing  Dispense: 18 tablet; Refill: 0 - albuterol (VENTOLIN HFA) 108 (90 Base) MCG/ACT inhaler; Inhale 2 puffs into the lungs every 6 (six) hours as needed for wheezing or shortness of breath.  Dispense: 8 g; Refill: 2  3. Vulvovaginal candidiasis Fluconazole prescribed as a preventive for her yeast infection while patient is on antibiotic - fluconazole (DIFLUCAN) 150 MG tablet; Take 1 tablet (150 mg total) by mouth once for 1 dose. May take an additional dose after 3 days if still symptomatic.  Dispense: 3 tablet; Refill: 0   General Counseling: Charlesia verbalizes understanding of the findings of today's phone visit and agrees with plan of treatment. I have discussed any further diagnostic evaluation that may be needed or ordered today. We also reviewed her medications today. she has been encouraged to call the office with any  questions or concerns that should arise related to todays visit.  Return if symptoms worsen or fail to improve.   No orders of the defined types were placed in this encounter.   Meds ordered this encounter  Medications   predniSONE (DELTASONE) 10 MG tablet    Sig: Take one tab 3 x day for 3 days, then take one tab 2 x a day for 3 days and then take one tab a day for 3 days for URI with wheezing    Dispense:  18 tablet    Refill:  0   amoxicillin-clavulanate (AUGMENTIN) 875-125 MG tablet    Sig: Take 1 tablet by mouth 2 (two) times daily for 10 days.    Dispense:  20 tablet    Refill:  0   fluconazole (DIFLUCAN) 150 MG tablet    Sig: Take 1 tablet (150 mg total) by mouth once for 1 dose. May take an additional dose after 3 days if still symptomatic.    Dispense:  3 tablet    Refill:  0   albuterol (VENTOLIN HFA) 108 (90 Base) MCG/ACT inhaler    Sig: Inhale 2 puffs into the lungs every 6 (six) hours as needed for wheezing or shortness of breath.    Dispense:  8 g    Refill:  2    Time spent:10 Minutes Time spent with patient included reviewing progress notes, labs, imaging studies, and discussing plan for follow up.  [place] Controlled Substance Database was reviewed by me for overdose risk score (ORS) if appropriate.  This patient was seen by Jonetta Osgood, FNP-C in collaboration with Dr. Clayborn Bigness as a part of collaborative care agreement.  Margretta Zamorano R. Valetta Fuller, MSN, FNP-C Internal medicine

## 2021-10-06 ENCOUNTER — Encounter: Payer: Self-pay | Admitting: Dermatology

## 2021-10-08 ENCOUNTER — Telehealth: Payer: Self-pay | Admitting: Rheumatology

## 2021-10-08 NOTE — Telephone Encounter (Signed)
Patient left a voicemail stating she had went to get labs done last week but the lab misplaced her blood and she needs new orders sent.

## 2021-10-08 NOTE — Telephone Encounter (Signed)
Spoke with patient and she states she has not received the paperwork to go to the lab and have it redrawn. Patient states she did request it to be mailed to her. Patient advised we have extremely slow mail and to give it a little longer to receive. Patient expressed understanding.

## 2021-10-11 ENCOUNTER — Telehealth: Payer: Self-pay

## 2021-10-11 ENCOUNTER — Other Ambulatory Visit: Payer: Self-pay

## 2021-10-11 MED ORDER — AZITHROMYCIN 250 MG PO TABS: ORAL_TABLET | ORAL | 0 refills | Status: DC

## 2021-10-11 NOTE — Progress Notes (Signed)
Office Visit Note  Patient: Andrea Bernard             Date of Birth: March 30, 1966           MRN: [id number]             PCP: [name], MD Referring: [name], MD Visit Date: 10/23/2021 Occupation: @GUAROCC @  Subjective:  Pain in both hands  History of Present Illness: Andrea Bernard is a 56 y.o. female returns today for follow-up visit for positive ANA and pain in both hands.  She continues to have pain and stiffness in her bilateral hands.  She gives history of intermittent swelling.  She also has discomfort in her bilateral trochanteric bursa.  None of the other joints are painful.  She states she had upper respiratory tract infection in December for which she was given Z-Pak and prednisone.  Activities of Daily Living:  Patient reports morning stiffness for 1 hour.   Patient Reports nocturnal pain.  Difficulty dressing/grooming: Reports Difficulty climbing stairs: Reports Difficulty getting out of chair: Reports Difficulty using hands for taps, buttons, cutlery, and/or writing: Reports  Review of Systems  Constitutional:  Positive for fatigue.  HENT:  Negative for mouth sores, mouth dryness and nose dryness.   Eyes:  Negative for pain, itching and dryness.  Respiratory:  Negative for shortness of breath and difficulty breathing.   Cardiovascular:  Negative for chest pain and palpitations.  Gastrointestinal:  Negative for blood in stool, constipation and diarrhea.  Endocrine: Negative for increased urination.  Genitourinary:  Negative for difficulty urinating.  Musculoskeletal:  Positive for joint pain, joint pain, joint swelling and morning stiffness. Negative for myalgias, muscle tenderness and myalgias.  Skin:  Negative for color change, rash and redness.  Allergic/Immunologic: Positive for susceptible to infections.  Neurological:  Positive for numbness and parasthesias. Negative for dizziness, headaches, memory loss and weakness.  Hematological:  Negative  for bruising/bleeding tendency.  Psychiatric/Behavioral:  Negative for confusion.    PMFS History:  Patient Active Problem List   Diagnosis Date Noted   Respiratory infection 10/25/2020   ANA positive 02/06/2020   Inflammatory polyarthropathy ([place]) 09/18/2019   Neurofibroma of foot 07/03/2019   Migraine syndrome 07/03/2019   Gastroesophageal reflux disease without esophagitis 07/03/2019   Acute upper respiratory infection 06/16/2019   Vaginal yeast infection 03/29/2019   Fever 12/31/2018   Cough 12/31/2018   Unspecified menopausal and perimenopausal disorder 09/15/2018   Fatigue 09/15/2018   Neurofibromatosis ([place]) 03/27/2018   Intractable chronic migraine without aura and without status migrainosus 03/03/2018   Acute non-recurrent maxillary sinusitis 02/10/2018   Vasomotor rhinitis 02/10/2018   Chronic pain syndrome 01/05/2018   Long term current use of opiate analgesic 01/05/2018   Pharmacologic therapy 01/05/2018   Disorder of skeletal system 01/05/2018   Problems influencing health status 01/05/2018   Chronic neck pain (Primary Area of Pain)(right) 01/05/2018   Chronic pain of both hips  [facility] Area of Pain) (L>R) 01/05/2018   Chronic bilateral low back pain without sciatica  (Fourth Area of Pain)) 01/05/2018   Chronic pain of right upper extremity (Secondary Area of Pain) 01/05/2018   Encounter for general adult medical examination with abnormal findings 12/27/2017   Abnormal weight gain 12/27/2017   Menorrhagia with regular cycle 12/27/2017   Dysuria 12/27/2017   Urinary tract infectious disease 10/27/2017   Iron deficiency anemia 06/15/2017   History of neuroblastoma 06/15/2017   Chronic pain 06/15/2017   Angiolipomatosis, familial 02/26/2017  Lipoma of left upper extremity 02/25/2017   Multiple lipomas 02/25/2017   Type 1 neurofibromatosis ([place]) 07/17/2015   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 Moderate atypia, limited margins free.   GERD (gastroesophageal reflux disease)    Headache    Iron deficiency anemia 06/15/2017   Neurofibromatosis ([place])     Family History  Problem Relation Age of Onset   Anemia Mother    Lupus Mother    Sjogren's syndrome Mother    Rheum arthritis Mother    Schizophrenia Father    Neurofibromatosis Brother    Neurofibromatosis Son    Neurofibromatosis Son    Neurofibromatosis Daughter    Bladder Cancer Neg Hx    Kidney cancer Neg Hx    Breast cancer Neg Hx    Ovarian cancer Neg Hx    Colon cancer Neg Hx    Past Surgical History:  Procedure Laterality Date   AUGMENTATION MAMMAPLASTY Bilateral 12/10/2020   BREAST ENHANCEMENT SURGERY Bilateral 12/04/2020   carpel tunnel release Bilateral    CESAREAN SECTION     x 4   COLONOSCOPY WITH PROPOFOL N/A 09/04/2017   Procedure: COLONOSCOPY WITH PROPOFOL;  Surgeon: [name], MD;  Location: [facility] ENDOSCOPY;  Service: Gastroenterology;  Laterality: N/A;   COLONOSCOPY WITH PROPOFOL N/A 12/21/2017   Procedure: COLONOSCOPY WITH PROPOFOL;  Surgeon: [name], MD;  Location[facility] ENDOSCOPY;  Service: Gastroenterology;  Laterality: N/A;   ESOPHAGOGASTRODUODENOSCOPY (EGD) WITH PROPOFOL N/A 09/04/2017   Procedure: ESOPHAGOGASTRODUODENOSCOPY (EGD) WITH PROPOFOL;  Surgeon: [name], MD;  Location: [facility] ENDOSCOPY;  Service: Gastroenterology;  Laterality: N/A;   HERNIA REPAIR     HERNIA REPAIR  12/04/2020   TUBAL LIGATION     tummy tuck  12/04/2020   removed 20 masses on stomach   TUMOR REMOVAL     x8   Social History   Social History Narrative   Not on file   Immunization History  Administered Date(s) Administered   Tdap 06/20/2020     Objective: Vital Signs: BP 102/65 (BP Location: Left Arm, Patient Position: Sitting, Cuff Size: Normal)    Pulse 60    Ht 5\' 4"  (1.626 m)     Wt 174 lb 9.6 oz (79.2 kg)    LMP 12/18/2017    BMI 29.97 kg/m    Physical Exam Vitals and nursing note reviewed.  Constitutional:      Appearance: She is well-developed.  HENT:     Head: Normocephalic and atraumatic.  Eyes:     Conjunctiva/sclera: Conjunctivae normal.  Cardiovascular:     Rate and Rhythm: Normal rate and regular rhythm.     Heart sounds: Normal heart sounds.  Pulmonary:     Effort: Pulmonary effort is normal.     Breath sounds: Normal breath sounds.  Abdominal:     General: Bowel sounds are normal.     Palpations: Abdomen is soft.  Musculoskeletal:     Cervical back: Normal range of motion.  Lymphadenopathy:     Cervical: No cervical adenopathy.  Skin:    General: Skin is warm and dry.     Capillary Refill: Capillary refill takes less than 2 seconds.  Neurological:     Mental Status: She is alert and oriented to person, place, and time.  Psychiatric:  Behavior: Behavior normal.     Musculoskeletal Exam: C-spine was in good range of motion.  Shoulder joints, elbow joints, wrist joints, MCPs PIPs and DIPs with good range of motion with no synovitis.  She had bilateral PIP and DIP thickening.  She had tenderness over bilateral trochanteric area.  Hip joints and knee joints in good range of motion.  There is no tenderness over ankles or MTPs.  CDAI Exam: CDAI Score: -- Patient Global: --; Provider Global: -- Swollen: --; Tender: -- Joint Exam 10/23/2021   No joint exam has been documented for this visit   There is currently no information documented on the homunculus. Go to the Rheumatology activity and complete the homunculus joint exam.  Investigation: No additional findings.  Imaging: XR Hand 2 View Left  Result Date: 09/26/2021 CMC, PIP and DIP narrowing was noted.  No MCP, intercarpal or radiocarpal joint space narrowing was noted.  No erosive changes were noted. Impression: These findings are consistent with osteoarthritis of the  hand.  XR Hand 2 View Right  Result Date: 09/26/2021 CMC, PIP and DIP narrowing was noted.  No MCP, intercarpal or radiocarpal joint space narrowing was noted.  No erosive changes were noted. Impression: These findings are consistent with osteoarthritis of the hand.   Recent Labs: Lab Results  Component Value Date   WBC 6.0 02/12/2021   HGB 10.6 (L) 02/12/2021   PLT 199 02/12/2021   NA 139 02/12/2021   K 4.1 02/12/2021   CL 107 02/12/2021   CO2 23 02/12/2021   GLUCOSE 112 (H) 02/12/2021   BUN 16 02/12/2021   CREATININE 0.89 02/12/2021   BILITOT 0.7 02/12/2021   ALKPHOS 64 02/12/2021   AST 34 02/12/2021   ALT 16 02/12/2021   PROT 6.5 02/12/2021   ALBUMIN 3.7 02/12/2021   CALCIUM 8.6 (L) 02/12/2021   GFRAA 86 07/29/2019   September 26, 2021 AVISE lupus index not determined, ANA negative, ENA negative, Jo 1 negative , CCP Negative, anticardiolipin negative, beta-2 GP 1 negative antiphosphatidylserine negative, CB CAP negative, RF IgM equivocal, IgM negative, anti-CCP negative, thyroglobulin negative, anti-TPO negative, anti-carP negative, antihistone negative  Speciality Comments: No specialty comments available.  Procedures:  No procedures performed Allergies: Nitrofuran derivatives   Assessment / Plan:     Visit Diagnoses: Polyarthralgia - History of pain and multiple joints.  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  Primary osteoarthritis of both hands - Clinical and radiographic findings were consistent with osteoarthritis.  A handout on hand exercises was given.  She was referred to physical therapy and Occupational Therapy.  Patient will schedule appointment.  She discussed advised to contact me in case she develops any new symptoms.  She will return on as needed basis.  Trigger finger, unspecified finger, unspecified laterality - She had bilateral third fourth and fifth trigger fingers.  She has been getting cortisone injections in [place].  History of right carpal  tunnel release .  Trochanteric bursitis of both hips -she continues to have some tenderness over trochanteric bursa.  A handout on IT band stretches was given and the stretching exercises were demonstrated..  Chronic pain of both knees - No warmth swelling or effusion was noted.  ANA positive -ANA, ENA, complements were all within normal limits.  RF IgM was equivocal.  Lab findings were discussed with the patient at length. No clinical features of autoimmune disease were noted on the examination.  Chronic pain syndrome - Due to neurofibromatosis.  She had several neurofibromas resected.  Intractable chronic migraine without aura and without status migrainosus  Gastroesophageal reflux disease without esophagitis  Type 1 neurofibromatosis ([facility])  Optic glioma ([facility])  Angiolipomatosis, familial  Long term current use of opiate analgesic  Family history of systemic lupus erythematosus - According to the patient her mother had lupus, Sjogren's and rheumatoid arthritis.  Family history of multiple sclerosis - Maternal grandmother  Orders: No orders of the defined types were placed in this encounter.  No orders of the defined types were placed in this encounter.    Follow-Up Instructions: Return if symptoms worsen or fail to improve, for Osteoarthritis.   Bo Merino, MD  Note - This record has been created using Editor, commissioning.  Chart creation errors have been sought, but may not always  have been located. Such creation errors do not reflect on  the standard of medical care.

## 2021-10-11 NOTE — Telephone Encounter (Signed)
As per alyssa send zpak and also advised pt that if she is not feeling better need appt in person

## 2021-10-23 ENCOUNTER — Ambulatory Visit: Payer: BC Managed Care – PPO | Admitting: Rheumatology

## 2021-10-23 ENCOUNTER — Other Ambulatory Visit: Payer: Self-pay

## 2021-10-23 ENCOUNTER — Encounter: Payer: Self-pay | Admitting: Rheumatology

## 2021-10-23 VITALS — BP 102/65 | HR 60 | Ht 64.0 in | Wt 174.6 lb

## 2021-10-23 DIAGNOSIS — D179 Benign lipomatous neoplasm, unspecified: Secondary | ICD-10-CM

## 2021-10-23 DIAGNOSIS — M2550 Pain in unspecified joint: Secondary | ICD-10-CM | POA: Diagnosis not present

## 2021-10-23 DIAGNOSIS — M19041 Primary osteoarthritis, right hand: Secondary | ICD-10-CM | POA: Diagnosis not present

## 2021-10-23 DIAGNOSIS — Z79891 Long term (current) use of opiate analgesic: Secondary | ICD-10-CM

## 2021-10-23 DIAGNOSIS — K219 Gastro-esophageal reflux disease without esophagitis: Secondary | ICD-10-CM

## 2021-10-23 DIAGNOSIS — G43719 Chronic migraine without aura, intractable, without status migrainosus: Secondary | ICD-10-CM

## 2021-10-23 DIAGNOSIS — M7061 Trochanteric bursitis, right hip: Secondary | ICD-10-CM

## 2021-10-23 DIAGNOSIS — R768 Other specified abnormal immunological findings in serum: Secondary | ICD-10-CM

## 2021-10-23 DIAGNOSIS — G894 Chronic pain syndrome: Secondary | ICD-10-CM

## 2021-10-23 DIAGNOSIS — C7230 Malignant neoplasm of unspecified optic nerve: Secondary | ICD-10-CM

## 2021-10-23 DIAGNOSIS — Q8501 Neurofibromatosis, type 1: Secondary | ICD-10-CM

## 2021-10-23 DIAGNOSIS — M19042 Primary osteoarthritis, left hand: Secondary | ICD-10-CM

## 2021-10-23 DIAGNOSIS — Z8269 Family history of other diseases of the musculoskeletal system and connective tissue: Secondary | ICD-10-CM

## 2021-10-23 DIAGNOSIS — M6530 Trigger finger, unspecified finger: Secondary | ICD-10-CM

## 2021-10-23 DIAGNOSIS — G8929 Other chronic pain: Secondary | ICD-10-CM

## 2021-10-23 DIAGNOSIS — M25562 Pain in left knee: Secondary | ICD-10-CM

## 2021-10-23 DIAGNOSIS — M25561 Pain in right knee: Secondary | ICD-10-CM

## 2021-10-23 DIAGNOSIS — Z820 Family history of epilepsy and other diseases of the nervous system: Secondary | ICD-10-CM

## 2021-10-23 DIAGNOSIS — M7062 Trochanteric bursitis, left hip: Secondary | ICD-10-CM

## 2021-10-23 NOTE — Patient Instructions (Signed)
Hand Exercises Hand exercises can be helpful for almost anyone. These exercises can strengthen the hands, improve flexibility and movement, and increase blood flow to the hands. These results can make work and daily tasks easier. Hand exercises can be especially helpful for people who have joint pain from arthritis or have nerve damage from overuse (carpal tunnel syndrome). These exercises can also help people who have injured a hand. Exercises Most of these hand exercises are gentle stretching and motion exercises. It is usually safe to do them often throughout the day. Warming up your hands before exercise may help to reduce stiffness. You can do this with gentle massage or by placing your hands in warm water for 10-15 minutes. It is normal to feel some stretching, pulling, tightness, or mild discomfort as you begin new exercises. This will gradually improve. Stop an exercise right away if you feel sudden, severe pain or your pain gets worse. Ask your health care provider which exercises are best for you. Knuckle bend or "claw" fist  Stand or sit with your arm, hand, and all five fingers pointed straight up. Make sure to keep your wrist straight during the exercise. Gently bend your fingers down toward your palm until the tips of your fingers are touching the top of your palm. Keep your big knuckle straight and just bend the small knuckles in your fingers. Hold this position for __________ seconds. Straighten (extend) your fingers back to the starting position. Repeat this exercise 5-10 times with each hand. Full finger fist  Stand or sit with your arm, hand, and all five fingers pointed straight up. Make sure to keep your wrist straight during the exercise. Gently bend your fingers into your palm until the tips of your fingers are touching the middle of your palm. Hold this position for __________ seconds. Extend your fingers back to the starting position, stretching every joint fully. Repeat  this exercise 5-10 times with each hand. Straight fist Stand or sit with your arm, hand, and all five fingers pointed straight up. Make sure to keep your wrist straight during the exercise. Gently bend your fingers at the big knuckle, where your fingers meet your hand, and the middle knuckle. Keep the knuckle at the tips of your fingers straight and try to touch the bottom of your palm. Hold this position for __________ seconds. Extend your fingers back to the starting position, stretching every joint fully. Repeat this exercise 5-10 times with each hand. Tabletop  Stand or sit with your arm, hand, and all five fingers pointed straight up. Make sure to keep your wrist straight during the exercise. Gently bend your fingers at the big knuckle, where your fingers meet your hand, as far down as you can while keeping the small knuckles in your fingers straight. Think of forming a tabletop with your fingers. Hold this position for __________ seconds. Extend your fingers back to the starting position, stretching every joint fully. Repeat this exercise 5-10 times with each hand. Finger spread  Place your hand flat on a table with your palm facing down. Make sure your wrist stays straight as you do this exercise. Spread your fingers and thumb apart from each other as far as you can until you feel a gentle stretch. Hold this position for __________ seconds. Bring your fingers and thumb tight together again. Hold this position for __________ seconds. Repeat this exercise 5-10 times with each hand. Making circles  Stand or sit with your arm, hand, and all five fingers pointed   straight up. Make sure to keep your wrist straight during the exercise. Make a circle by touching the tip of your thumb to the tip of your index finger. Hold for __________ seconds. Then open your hand wide. Repeat this motion with your thumb and each finger on your hand. Repeat this exercise 5-10 times with each hand. Thumb  motion  Sit with your forearm resting on a table and your wrist straight. Your thumb should be facing up toward the ceiling. Keep your fingers relaxed as you move your thumb. Lift your thumb up as high as you can toward the ceiling. Hold for __________ seconds. Bend your thumb across your palm as far as you can, reaching the tip of your thumb for the small finger (pinkie) side of your palm. Hold for __________ seconds. Repeat this exercise 5-10 times with each hand. Grip strengthening  Hold a stress ball or other soft ball in the middle of your hand. Slowly increase the pressure, squeezing the ball as much as you can without causing pain. Think of bringing the tips of your fingers into the middle of your palm. All of your finger joints should bend when doing this exercise. Hold your squeeze for __________ seconds, then relax. Repeat this exercise 5-10 times with each hand. Contact a health care provider if: Your hand pain or discomfort gets much worse when you do an exercise. Your hand pain or discomfort does not improve within 2 hours after you exercise. If you have any of these problems, stop doing these exercises right away. Do not do them again unless your health care provider says that you can. Get help right away if: You develop sudden, severe hand pain or swelling. If this happens, stop doing these exercises right away. Do not do them again unless your health care provider says that you can. This information is not intended to replace advice given to you by your health care provider. Make sure you discuss any questions you have with your health care provider. Document Revised: 01/10/2021 Document Reviewed: 01/10/2021 Elsevier Patient Education  2022 [employer]  

## 2021-11-04 ENCOUNTER — Telehealth: Payer: Self-pay

## 2021-11-04 NOTE — Telephone Encounter (Signed)
Called pt, no answer and no VM. aw

## 2021-11-04 NOTE — Telephone Encounter (Signed)
Patient called stating she had another painful lipoma. We scheduled her for surgery (next available). Are you okay with this or would you like to see patient first?

## 2021-11-05 NOTE — Telephone Encounter (Signed)
Patient advised and scheduled. aw 

## 2021-11-05 NOTE — Telephone Encounter (Signed)
Spoke with patient and place is located on her arm about 2 inches away from the arm pit. She states this area is sore to touch and hard.

## 2021-12-03 ENCOUNTER — Ambulatory Visit: Payer: BC Managed Care – PPO | Admitting: Dermatology

## 2021-12-03 ENCOUNTER — Other Ambulatory Visit: Payer: Self-pay

## 2021-12-03 DIAGNOSIS — D1721 Benign lipomatous neoplasm of skin and subcutaneous tissue of right arm: Secondary | ICD-10-CM | POA: Diagnosis not present

## 2021-12-03 DIAGNOSIS — L820 Inflamed seborrheic keratosis: Secondary | ICD-10-CM

## 2021-12-03 DIAGNOSIS — L821 Other seborrheic keratosis: Secondary | ICD-10-CM | POA: Diagnosis not present

## 2021-12-03 DIAGNOSIS — L578 Other skin changes due to chronic exposure to nonionizing radiation: Secondary | ICD-10-CM | POA: Diagnosis not present

## 2021-12-03 DIAGNOSIS — D492 Neoplasm of unspecified behavior of bone, soft tissue, and skin: Secondary | ICD-10-CM

## 2021-12-03 MED ORDER — MUPIROCIN 2 % EX OINT: 1.0000 "application " | TOPICAL_OINTMENT | Freq: Every day | CUTANEOUS | 1 refills | Status: DC

## 2021-12-03 NOTE — Progress Notes (Signed)
Follow-Up Visit   Subjective  Andrea Bernard is a 56 y.o. female who presents for the following: Lipoma vs other (R tricep area, one is new ~1.6m).  Lesion is painful and symptomatic. She also has spots on her face that are itchy and she would like consideration for treatment. The patient has spots, moles and lesions to be evaluated, some may be new or changing and the patient has concerns that these could be cancer.   The following portions of the chart were reviewed this encounter and updated as appropriate:   Tobacco   Allergies   Meds   Problems   Med Hx   Surg Hx   Fam Hx      Review of Systems:  No other skin or systemic complaints except as noted in HPI or Assessment and Plan.  Objective  Well appearing patient in no apparent distress; mood and affect are within normal limits.  A focused examination was performed including right tricep area. Relevant physical exam findings are noted in the Assessment and Plan.  R medial tricep Rubbery nodule 1.2cm  L cheek x 2, L forehead x 1 (3) Stuck on waxy paps with erythema   Assessment & Plan   Seborrheic Keratoses - Stuck-on, waxy, tan-brown papules and/or plaques  - Benign-appearing - Discussed benign etiology and prognosis. - Observe - Call for any changes  Actinic Damage - chronic, secondary to cumulative UV radiation exposure/sun exposure over time - diffuse scaly erythematous macules with underlying dyspigmentation - Recommend daily broad spectrum sunscreen SPF 30+ to sun-exposed areas, reapply every 2 hours as needed.  - Recommend staying in the shade or wearing long sleeves, sun glasses (UVA+UVB protection) and wide brim hats (4-inch brim around the entire circumference of the hat). - Call for new or changing lesions.  Neoplasm of skin R medial tricep  mupirocin ointment (BACTROBAN) 2 % Apply 1 application topically daily. Qd to excision site  Skin excision  Lesion length (cm):  1.2 Lesion width (cm):   1.2 Margin per side (cm):  0 Total excision diameter (cm):  1.2 Informed consent: discussed and consent obtained   Timeout: patient name, date of birth, surgical site, and procedure verified   Procedure prep:  Patient was prepped and draped in usual sterile fashion Prep type:  Isopropyl alcohol and povidone-iodine Anesthesia: the lesion was anesthetized in a standard fashion   Anesthetic:  1% lidocaine w/ epinephrine 1-100,000 buffered w/ 8.4% NaHCO3 (6cc lido w/ epi, 3cc bupivicaine, Total of 9cc) Instrument used comment:  #15c blade Hemostasis achieved with: pressure   Hemostasis achieved with comment:  Electrocautery Outcome: patient tolerated procedure well with no complications   Post-procedure details: sterile dressing applied and wound care instructions given   Dressing type: bandage and pressure dressing (Mupirocin)    Skin repair Complexity:  Complex Final length (cm):  1.2 Reason for type of repair: reduce tension to allow closure, reduce the risk of dehiscence, infection, and necrosis, reduce subcutaneous dead space and avoid a hematoma, allow closure of the large defect, preserve normal anatomy, preserve normal anatomical and functional relationships and enhance both functionality and cosmetic results   Undermining: area extensively undermined   Undermining comment:  Undermining Defect 1.2cm Subcutaneous layers (deep stitches):  Suture size:  4-0 Suture type: Vicryl (polyglactin 910)   Subcutaneous suture technique: Inverted Dermal. Fine/surface layer approximation (top stitches):  Suture size:  4-0 Suture type: nylon   Stitches: horizontal mattress   Suture removal (days):  7 Hemostasis achieved  with: pressure Outcome: patient tolerated procedure well with no complications   Post-procedure details: sterile dressing applied and wound care instructions given   Dressing type: bandage, pressure dressing and bacitracin (Mupirocin)    Specimen 1 - Surgical  pathology Differential Diagnosis: D48.5 Lipoma vs other  Check Margins: No Rubbery nodule 1.2cm  Lipoma vs other  Inflamed seborrheic keratosis (3) L cheek x 2, L forehead x 1  Destruction of lesion - L cheek x 2, L forehead x 1 Complexity: simple   Destruction method: cryotherapy   Informed consent: discussed and consent obtained   Timeout:  patient name, date of birth, surgical site, and procedure verified Lesion destroyed using liquid nitrogen: Yes   Region frozen until ice ball extended beyond lesion: Yes   Outcome: patient tolerated procedure well with no complications   Post-procedure details: wound care instructions given     Return in about 1 week (around 12/10/2021) for suture removal.  I, Othelia Pulling, RMA, am acting as scribe for Sarina Ser, MD . Documentation: I have reviewed the above documentation for accuracy and completeness, and I agree with the above.  Sarina Ser, MD

## 2021-12-03 NOTE — Patient Instructions (Signed)

## 2021-12-04 ENCOUNTER — Telehealth: Payer: Self-pay

## 2021-12-04 NOTE — Telephone Encounter (Signed)
Pt doing fine after yesterdays surgery./sh 

## 2021-12-06 ENCOUNTER — Encounter: Payer: Self-pay | Admitting: Dermatology

## 2021-12-10 ENCOUNTER — Ambulatory Visit: Payer: BC Managed Care – PPO | Admitting: Dermatology

## 2021-12-11 ENCOUNTER — Other Ambulatory Visit: Payer: Self-pay

## 2021-12-11 ENCOUNTER — Ambulatory Visit (INDEPENDENT_AMBULATORY_CARE_PROVIDER_SITE_OTHER): Payer: BC Managed Care – PPO | Admitting: Dermatology

## 2021-12-11 DIAGNOSIS — D1721 Benign lipomatous neoplasm of skin and subcutaneous tissue of right arm: Secondary | ICD-10-CM

## 2021-12-11 DIAGNOSIS — Z4802 Encounter for removal of sutures: Secondary | ICD-10-CM

## 2021-12-11 NOTE — Patient Instructions (Signed)

## 2021-12-11 NOTE — Progress Notes (Signed)
? ?  Follow-Up Visit ?  ?Subjective  ?AOIFE BOLD is a 56 y.o. female who presents for the following: Other (Post op - Lipoma of right medial tricep). ? ?The following portions of the chart were reviewed this encounter and updated as appropriate:  ? Tobacco  Allergies  Meds  Problems  Med Hx  Surg Hx  Fam Hx   ?  ?Review of Systems:  No other skin or systemic complaints except as noted in HPI or Assessment and Plan. ? ?Objective  ?Well appearing patient in no apparent distress; mood and affect are within normal limits. ? ?A focused examination was performed including right arm. Relevant physical exam findings are noted in the Assessment and Plan. ? ?Right medial tricep ?Healing excision site ? ? ?Assessment & Plan  ?Lipoma of right upper extremity ?Right medial tricep ? ?Encounter for Removal of Sutures ?- Incision site at the right medial tricep is clean, dry and intact ?- Wound cleansed, sutures removed, wound cleansed and steri strips applied.  ?- Discussed pathology results showing lipoma.  ?- Patient advised to keep steri-strips dry until they fall off. ?- Scars remodel for a full year. ?- Once steri-strips fall off, patient can apply over-the-counter silicone scar cream each night to help with scar remodeling if desired. ?- Patient advised to call with any concerns or if they notice any new or changing lesions. ? ?Return if symptoms worsen or fail to improve. ? ?I, Ashok Cordia, CMA, am acting as scribe for Sarina Ser, MD . ?Documentation: I have reviewed the above documentation for accuracy and completeness, and I agree with the above. ? ?Sarina Ser, MD ? ? ? ?

## 2021-12-15 ENCOUNTER — Encounter: Payer: Self-pay | Admitting: Dermatology

## 2021-12-23 ENCOUNTER — Telehealth (INDEPENDENT_AMBULATORY_CARE_PROVIDER_SITE_OTHER): Payer: BC Managed Care – PPO | Admitting: Internal Medicine

## 2021-12-23 DIAGNOSIS — B3731 Acute candidiasis of vulva and vagina: Secondary | ICD-10-CM

## 2021-12-23 DIAGNOSIS — J45909 Unspecified asthma, uncomplicated: Secondary | ICD-10-CM

## 2021-12-23 MED ORDER — AZITHROMYCIN 250 MG PO TABS
ORAL_TABLET | ORAL | 0 refills | Status: DC
Start: 2021-12-23 — End: 2022-02-13

## 2021-12-23 MED ORDER — FLUCONAZOLE 150 MG PO TABS: ORAL_TABLET | ORAL | 0 refills | Status: DC

## 2021-12-23 NOTE — Progress Notes (Signed)
[facility] ?[address] ?[place], [place] [address] ? ?Internal MEDICINE  ?Telephone Visit ? ?Patient Name: Andrea Bernard ? [id number]  ?[id number] ? ?Date of Service: 12/23/2021 ? ?I connected with the patient at 1040 am  by telephone and verified the patients identity using two identifiers.   ?I discussed the limitations, risks, security and privacy concerns of performing an evaluation and management service by telephone and the availability of in person appointments. I also discussed with the patient that there may be a patient responsible charge related to the service.  The patient expressed understanding and agrees to proceed.   ? ?Chief Complaint  ?Patient presents with  ? Telephone Assessment  ?  [id number]  ? Telephone Screen  ?  Going on for 2 weeks   ? Cough  ?  Brown mucus   ? Wheezing  ? Sinusitis  ? ? ?HPI ?Pt s connected for sick and acute visit ?C/p sore throat and itchy eyes for last 2 weeks, she thought she had allergies and took OTC allergy medicine, initially felt better however for last 3 days she has been worse, cough and wheezing. Nasal worse with discharge ?Denies any chest pain, fever or chills  ? ? ? ?Current Medication: ?Outpatient Encounter Medications as of 12/23/2021  ?Medication Sig  ? albuterol (VENTOLIN HFA) 108 (90 Base) MCG/ACT inhaler Inhale 2 puffs into the lungs every 6 (six) hours as needed for wheezing or shortness of breath.  ? azithromycin (ZITHROMAX) 250 MG tablet Take one tab a day for 10 days for uri  ? fluconazole (DIFLUCAN) 150 MG tablet Take one tab po q week for fungal infection  ? gabapentin (NEURONTIN) 300 MG capsule Take 300 mg by mouth 3 (three) times daily.  ? ibuprofen (ADVIL) 200 MG tablet Take 200 mg by mouth as needed.  ? mupirocin ointment (BACTROBAN) 2 % Apply 1 application topically daily. Qd to excision site  ? OXYCONTIN 80 MG 12 hr tablet Take 80 mg by mouth every 12 (twelve) hours.  ? pantoprazole (PROTONIX) 40 MG tablet TAKE 1 TABLET BY  MOUTH TWICE A DAY  ? SUMAtriptan (IMITREX) 100 MG tablet TAKE 1 TABLET BY MOUTH DAILY AS NEEDED FOR MIGRAINE.  ? ?Facility-Administered Encounter Medications as of 12/23/2021  ?Medication  ? iron sucrose (VENOFER) 200 mg IVPB  ? sodium chloride flush (NS) 0.9 % injection 10 mL  ? ? ?Surgical History: ?Past Surgical History:  ?Procedure Laterality Date  ? AUGMENTATION MAMMAPLASTY Bilateral 12/10/2020  ? BREAST ENHANCEMENT SURGERY Bilateral 12/04/2020  ? carpel tunnel release Bilateral   ? CESAREAN SECTION    ? x 4  ? COLONOSCOPY WITH PROPOFOL N/A 09/04/2017  ? Procedure: COLONOSCOPY WITH PROPOFOL;  Surgeon: [name], MD;  Location[facility] ENDOSCOPY;  Service: Gastroenterology;  Laterality: N/A;  ? COLONOSCOPY WITH PROPOFOL N/A 12/21/2017  ? Procedure: COLONOSCOPY WITH PROPOFOL;  Surgeon: [name], MD;  Location[facility] ENDOSCOPY;  Service: Gastroenterology;  Laterality: N/A;  ? ESOPHAGOGASTRODUODENOSCOPY (EGD) WITH PROPOFOL N/A 09/04/2017  ? Procedure: ESOPHAGOGASTRODUODENOSCOPY (EGD) WITH PROPOFOL;  Surgeon: [name], MD;  Location: [facility] ENDOSCOPY;  Service: Gastroenterology;  Laterality: N/A;  ? HERNIA REPAIR    ? HERNIA REPAIR  12/04/2020  ? TUBAL LIGATION    ? tummy tuck  12/04/2020  ? removed 20 masses on stomach  ? TUMOR REMOVAL    ? x8  ? ? ?Medical History: ?Past Medical History:  ?Diagnosis Date  ? Arthritis   ? Dysplastic nevus 03/02/2008  ? Right  lateral thigh. Moderate atypia, limited margins free.  ? GERD (gastroesophageal reflux disease)   ? Headache   ? Iron deficiency anemia 06/15/2017  ? Neurofibromatosis ([place])   ? ? ?Family History: ?Family History  ?Problem Relation Age of Onset  ? Anemia Mother   ? Lupus Mother   ? Sjogren's syndrome Mother   ? Rheum arthritis Mother   ? Schizophrenia Father   ? Neurofibromatosis Brother   ? Neurofibromatosis Son   ? Neurofibromatosis Son   ? Neurofibromatosis Daughter   ? Bladder Cancer Neg Hx   ? Kidney cancer Neg Hx   ? Breast cancer Neg Hx   ? Ovarian  cancer Neg Hx   ? Colon cancer Neg Hx   ? ? ?Social History  ? ?Socioeconomic History  ? Marital status: Married  ?  Spouse name: Not on file  ? Number of children: Not on file  ? Years of education: Not on file  ? Highest education level: Not on file  ?Occupational History  ? Not on file  ?Tobacco Use  ? Smoking status: Former  ?  Types: Cigarettes  ? Smokeless tobacco: Never  ? Tobacco comments:  ?  Age [age]  ?Vaping Use  ? Vaping Use: Never used  ?Substance and Sexual Activity  ? Alcohol use: No  ? Drug use: Not Currently  ? Sexual activity: Yes  ?  Birth control/protection: Surgical  ?Other Topics Concern  ? Not on file  ?Social History Narrative  ? Not on file  ? ?Social Determinants of Health  ? ?Financial Resource Strain: Not on file  ?Food Insecurity: Not on file  ?Transportation Needs: Not on file  ?Physical Activity: Not on file  ?Stress: Not on file  ?Social Connections: Not on file  ?Intimate Partner Violence: Not on file  ? ? ? ? ?Review of Systems  ?Constitutional:  Negative for fatigue and fever.  ?HENT:  Positive for sinus pain. Negative for congestion, mouth sores and postnasal drip.   ?Respiratory:  Positive for cough, shortness of breath and wheezing.   ?Cardiovascular:  Negative for chest pain.  ?Genitourinary:  Negative for flank pain.  ?Psychiatric/Behavioral: Negative.    ? ?Vital Signs: ?LMP 12/18/2017  ? ? ?Observation/Objective: ?Pt sounds congested and has deep cough  ? ? ? ?Assessment/Plan: ?1. Acute asthmatic bronchitis ?Start therapy as prescribed today  ?- azithromycin (ZITHROMAX) 250 MG tablet; Take one tab a day for 10 days for uri  Dispense: 10 tablet; Refill: 0 ? ?2. Vulvovaginal candidiasis ?Pt is prone to get MM candidosis after abx use, will give prophylactic  rx for diflucan  ?- fluconazole (DIFLUCAN) 150 MG tablet; Take one tab po q week for fungal infection  Dispense: 4 tablet; Refill: 0  ? ?General Counseling: braelyn jenson understanding of the findings of today's  phone visit and agrees with plan of treatment. I have discussed any further diagnostic evaluation that may be needed or ordered today. We also reviewed her medications today. she has been encouraged to call the office with any questions or concerns that should arise related to todays visit. ? ? ? ?No orders of the defined types were placed in this encounter. ? ? ?Meds ordered this encounter  ?Medications  ? azithromycin (ZITHROMAX) 250 MG tablet  ?  Sig: Take one tab a day for 10 days for uri  ?  Dispense:  10 tablet  ?  Refill:  0  ? fluconazole (DIFLUCAN) 150 MG tablet  ?  Sig: Take one  tab po q week for fungal infection  ?  Dispense:  4 tablet  ?  Refill:  0  ? ? ?Time spent:15 Minutes ? ? ? ?Dr [name] ?Internal medicine  ?

## 2021-12-27 ENCOUNTER — Other Ambulatory Visit: Payer: Self-pay | Admitting: Physician Assistant

## 2021-12-27 DIAGNOSIS — G43909 Migraine, unspecified, not intractable, without status migrainosus: Secondary | ICD-10-CM

## 2021-12-27 DIAGNOSIS — L601 Onycholysis: Secondary | ICD-10-CM

## 2021-12-27 DIAGNOSIS — D509 Iron deficiency anemia, unspecified: Secondary | ICD-10-CM

## 2021-12-27 DIAGNOSIS — Z0001 Encounter for general adult medical examination with abnormal findings: Secondary | ICD-10-CM

## 2021-12-27 DIAGNOSIS — K219 Gastro-esophageal reflux disease without esophagitis: Secondary | ICD-10-CM

## 2021-12-27 DIAGNOSIS — G894 Chronic pain syndrome: Secondary | ICD-10-CM

## 2021-12-27 DIAGNOSIS — Q8500 Neurofibromatosis, unspecified: Secondary | ICD-10-CM

## 2021-12-27 DIAGNOSIS — N644 Mastodynia: Secondary | ICD-10-CM

## 2021-12-27 DIAGNOSIS — R300 Dysuria: Secondary | ICD-10-CM

## 2021-12-27 DIAGNOSIS — Z9882 Breast implant status: Secondary | ICD-10-CM

## 2021-12-27 DIAGNOSIS — E7800 Pure hypercholesterolemia, unspecified: Secondary | ICD-10-CM

## 2022-01-24 ENCOUNTER — Other Ambulatory Visit: Payer: Self-pay | Admitting: Physician Assistant

## 2022-01-24 DIAGNOSIS — N644 Mastodynia: Secondary | ICD-10-CM

## 2022-01-24 DIAGNOSIS — Q8500 Neurofibromatosis, unspecified: Secondary | ICD-10-CM

## 2022-01-24 DIAGNOSIS — G43909 Migraine, unspecified, not intractable, without status migrainosus: Secondary | ICD-10-CM

## 2022-01-24 DIAGNOSIS — G894 Chronic pain syndrome: Secondary | ICD-10-CM

## 2022-01-24 DIAGNOSIS — Z9882 Breast implant status: Secondary | ICD-10-CM

## 2022-01-24 DIAGNOSIS — E7800 Pure hypercholesterolemia, unspecified: Secondary | ICD-10-CM

## 2022-01-24 DIAGNOSIS — Z0001 Encounter for general adult medical examination with abnormal findings: Secondary | ICD-10-CM

## 2022-01-24 DIAGNOSIS — L601 Onycholysis: Secondary | ICD-10-CM

## 2022-01-24 DIAGNOSIS — D509 Iron deficiency anemia, unspecified: Secondary | ICD-10-CM

## 2022-01-24 DIAGNOSIS — K219 Gastro-esophageal reflux disease without esophagitis: Secondary | ICD-10-CM

## 2022-01-24 DIAGNOSIS — R300 Dysuria: Secondary | ICD-10-CM

## 2022-02-12 ENCOUNTER — Encounter: Payer: BC Managed Care – PPO | Admitting: Physician Assistant

## 2022-02-13 ENCOUNTER — Encounter: Payer: Self-pay | Admitting: Physician Assistant

## 2022-02-13 ENCOUNTER — Ambulatory Visit (INDEPENDENT_AMBULATORY_CARE_PROVIDER_SITE_OTHER): Payer: BC Managed Care – PPO | Admitting: Physician Assistant

## 2022-02-13 VITALS — BP 118/72 | HR 52 | Temp 97.8°F | Resp 16 | Ht 64.0 in | Wt 175.0 lb

## 2022-02-13 DIAGNOSIS — Z1231 Encounter for screening mammogram for malignant neoplasm of breast: Secondary | ICD-10-CM

## 2022-02-13 DIAGNOSIS — E7800 Pure hypercholesterolemia, unspecified: Secondary | ICD-10-CM

## 2022-02-13 DIAGNOSIS — D509 Iron deficiency anemia, unspecified: Secondary | ICD-10-CM

## 2022-02-13 DIAGNOSIS — R300 Dysuria: Secondary | ICD-10-CM | POA: Diagnosis not present

## 2022-02-13 DIAGNOSIS — Z0001 Encounter for general adult medical examination with abnormal findings: Secondary | ICD-10-CM

## 2022-02-13 DIAGNOSIS — E538 Deficiency of other specified B group vitamins: Secondary | ICD-10-CM

## 2022-02-13 DIAGNOSIS — E559 Vitamin D deficiency, unspecified: Secondary | ICD-10-CM

## 2022-02-13 DIAGNOSIS — Q8500 Neurofibromatosis, unspecified: Secondary | ICD-10-CM

## 2022-02-13 DIAGNOSIS — R5383 Other fatigue: Secondary | ICD-10-CM

## 2022-02-13 DIAGNOSIS — R7309 Other abnormal glucose: Secondary | ICD-10-CM

## 2022-02-13 LAB — POCT URINALYSIS DIPSTICK
Glucose, UA: NEGATIVE
Nitrite, UA: NEGATIVE
Protein, UA: POSITIVE — AB
Spec Grav, UA: 1.025 (ref 1.010–1.025)
Urobilinogen, UA: 0.2 E.U./dL
pH, UA: 5 (ref 5.0–8.0)

## 2022-02-13 MED ORDER — CIPROFLOXACIN HCL 500 MG PO TABS: 500.0000 mg | ORAL_TABLET | Freq: Two times a day (BID) | ORAL | 0 refills | Status: AC

## 2022-02-13 NOTE — Progress Notes (Signed)
[facility] [place], [place] [address]  Internal MEDICINE  Office Visit Note  Patient Name: Andrea Bernard  [id number]  [id number]  Date of Service: 02/22/2022  Chief Complaint  Patient presents with   Annual Exam   Gastroesophageal Reflux   Toe Pain    Possible toe nail infection, has seen podiatrist    Weight Loss    Interested in weight loss, would like lab work done   Urinary Tract Infection    Pressure and urinary frequency     HPI Pt is here for routine health maintenance examination -Pt is having urinary tract symptoms, pressure and urinary frequency. She previously saw urology due to blood in urine and recurrence, but nothing was found. -She neurofibromatosis and is follow by pain management, however pt's pain is worsening and thinks she may feel better if she could lose some weight and wants to check labs and see if she may have elevated glucose or diabetes and start on medication that may help her cravings -tobramyacin on toenail from dermatology and toenail got much better. But then she hit nail and it started back up. She is going to follow back up with her dermatologist -Saw podiatry for heel pain and had an injection -due for pap but would like to postpone until next year -due for mammogram, colonoscopy UTD  Current Medication: Outpatient Encounter Medications as of 02/13/2022  Medication Sig   albuterol (VENTOLIN HFA) 108 (90 Base) MCG/ACT inhaler Inhale 2 puffs into the lungs every 6 (six) hours as needed for wheezing or shortness of breath.   ciprofloxacin (CIPRO) 500 MG tablet Take 1 tablet (500 mg total) by mouth 2 (two) times daily for 10 days.   gabapentin (NEURONTIN) 300 MG capsule Take 300 mg by mouth 3 (three) times daily.   ibuprofen (ADVIL) 200 MG tablet Take 200 mg by mouth as needed.   OXYCONTIN 80 MG 12 hr tablet Take 80 mg by mouth every 12 (twelve) hours.   pantoprazole (PROTONIX) 40 MG tablet TAKE 1 TABLET BY MOUTH  TWICE A DAY   SUMAtriptan (IMITREX) 100 MG tablet TAKE 1 TABLET BY MOUTH DAILY AS NEEDED FOR MIGRAINE.   [DISCONTINUED] azithromycin (ZITHROMAX) 250 MG tablet Take one tab a day for 10 days for uri   [DISCONTINUED] fluconazole (DIFLUCAN) 150 MG tablet Take one tab po q week for fungal infection   [DISCONTINUED] mupirocin ointment (BACTROBAN) 2 % Apply 1 application topically daily. Qd to excision site   Facility-Administered Encounter Medications as of 02/13/2022  Medication   iron sucrose (VENOFER) 200 mg IVPB   sodium chloride flush (NS) 0.9 % injection 10 mL    Surgical History: Past Surgical History:  Procedure Laterality Date   AUGMENTATION MAMMAPLASTY Bilateral 12/10/2020   BREAST ENHANCEMENT SURGERY Bilateral 12/04/2020   carpel tunnel release Bilateral    CESAREAN SECTION     x 4   COLONOSCOPY WITH PROPOFOL N/A 09/04/2017   Procedure: COLONOSCOPY WITH PROPOFOL;  Surgeon: [name], MD;  Location: [facility] ENDOSCOPY;  Service: Gastroenterology;  Laterality: N/A;   COLONOSCOPY WITH PROPOFOL N/A 12/21/2017   Procedure: COLONOSCOPY WITH PROPOFOL;  Surgeon: [name], MD;  Location: [facility] ENDOSCOPY;  Service: Gastroenterology;  Laterality: N/A;   ESOPHAGOGASTRODUODENOSCOPY (EGD) WITH PROPOFOL N/A 09/04/2017   Procedure: ESOPHAGOGASTRODUODENOSCOPY (EGD) WITH PROPOFOL;  Surgeon: [name], MD;  Location: [facility] ENDOSCOPY;  Service: Gastroenterology;  Laterality: N/A;   HERNIA REPAIR     HERNIA REPAIR  12/04/2020   TUBAL LIGATION  tummy tuck  12/04/2020   removed 20 masses on stomach   TUMOR REMOVAL     x8    Medical History: Past Medical History:  Diagnosis Date   Arthritis    Dysplastic nevus 03/02/2008   Right lateral thigh. Moderate atypia, limited margins free.   GERD (gastroesophageal reflux disease)    Headache    Iron deficiency anemia 06/15/2017   Neurofibromatosis ([place])     Family History: Family History  Problem Relation Age of Onset   Anemia Mother     Lupus Mother    Sjogren's syndrome Mother    Rheum arthritis Mother    Schizophrenia Father    Neurofibromatosis Brother    Neurofibromatosis Son    Neurofibromatosis Son    Neurofibromatosis Daughter    Bladder Cancer Neg Hx    Kidney cancer Neg Hx    Breast cancer Neg Hx    Ovarian cancer Neg Hx    Colon cancer Neg Hx       Review of Systems  Constitutional:  Negative for chills, fatigue and unexpected weight change.  HENT:  Negative for congestion, rhinorrhea, sneezing and sore throat.   Eyes:  Negative for redness.  Respiratory:  Negative for cough, chest tightness and shortness of breath.   Cardiovascular:  Negative for chest pain and palpitations.  Gastrointestinal:  Negative for abdominal pain, constipation, diarrhea, nausea and vomiting.  Genitourinary:  Positive for dysuria, frequency and urgency.  Musculoskeletal:  Positive for arthralgias. Negative for back pain, joint swelling and neck pain.  Skin:  Negative for rash.  Neurological: Negative.  Negative for tremors and numbness.  Hematological:  Negative for adenopathy. Does not bruise/bleed easily.  Psychiatric/Behavioral:  Negative for behavioral problems (Depression), sleep disturbance and suicidal ideas. The patient is not nervous/anxious.     Vital Signs: BP 118/72   Pulse (!) 52   Temp 97.8 F (36.6 C)   Resp 16   Ht _0  (1.626 m)   Wt 175 lb (79.4 kg)   LMP 12/18/2017   SpO2 100%   BMI 30.04 kg/m    Physical Exam Vitals and nursing note reviewed.  Constitutional:      General: She is not in acute distress.    Appearance: She is well-developed. She is not diaphoretic.  HENT:     Head: Normocephalic and atraumatic.     Mouth/Throat:     Pharynx: No oropharyngeal exudate.  Eyes:     Pupils: Pupils are equal, round, and reactive to light.  Neck:     Thyroid: No thyromegaly.     Vascular: No JVD.     Trachea: No tracheal deviation.  Cardiovascular:     Rate and Rhythm: Normal rate and  regular rhythm.     Heart sounds: Normal heart sounds. No murmur heard.   No friction rub. No gallop.  Pulmonary:     Effort: Pulmonary effort is normal. No respiratory distress.     Breath sounds: No wheezing or rales.  Chest:     Chest wall: No tenderness.  Breasts:    Right: Normal. No mass.     Left: No mass.  Abdominal:     General: Bowel sounds are normal.     Palpations: Abdomen is soft.     Tenderness: There is no abdominal tenderness.  Musculoskeletal:        General: Normal range of motion.     Cervical back: Normal range of motion and neck supple.  Lymphadenopathy:  Cervical: No cervical adenopathy.  Skin:    General: Skin is warm and dry.  Neurological:     Mental Status: She is alert and oriented to person, place, and time.     Cranial Nerves: No cranial nerve deficit.  Psychiatric:        Behavior: Behavior normal.        Thought Content: Thought content normal.        Judgment: Judgment normal.     LABS: Recent Results (from the past 2160 hour(s))  POCT Urinalysis Dipstick     Status: Abnormal   Collection Time: 02/13/22 10:21 AM  Result Value Ref Range   Color, UA     Clarity, UA     Glucose, UA Negative Negative   Bilirubin, UA Small    Ketones, UA Small    Spec Grav, UA 1.025 1.010 - 1.025   Blood, UA Small    pH, UA 5.0 5.0 - 8.0   Protein, UA Positive (A) Negative   Urobilinogen, UA 0.2 0.2 or 1.0 E.U./dL   Nitrite, UA Negative    Leukocytes, UA Trace (A) Negative   Appearance     Odor    CULTURE, URINE COMPREHENSIVE     Status: None   Collection Time: 02/13/22  2:05 PM   Specimen: Urine   Urine  Result Value Ref Range   Urine Culture, Comprehensive Final report    Organism ID, Bacteria Comment     Comment: Viridans streptococcus group 50,000-100,000 colony forming units per mL Susceptibility not normally performed on this organism.    Organism ID, Bacteria Comment     Comment: Mixed urogenital flora 2,000 Colonies/mL   CBC  w/Diff/Platelet     Status: Abnormal   Collection Time: 02/18/22 11:43 AM  Result Value Ref Range   WBC 7.0 3.4 - 10.8 x10E3/uL   RBC 4.45 3.77 - 5.28 x10E6/uL   Hemoglobin 11.2 11.1 - 15.9 g/dL   Hematocrit 36.1 34.0 - 46.6 %   MCV 81 79 - 97 fL   MCH 25.2 (L) 26.6 - 33.0 pg   MCHC 31.0 (L) 31.5 - 35.7 g/dL   RDW 13.8 11.7 - 15.4 %   Platelets 287 150 - 450 x10E3/uL   Neutrophils 57 Not Estab. %   Lymphs 27 Not Estab. %   Monocytes 10 Not Estab. %   Eos 5 Not Estab. %   Basos 1 Not Estab. %   Neutrophils Absolute 4.0 1.4 - 7.0 x10E3/uL   Lymphocytes Absolute 1.9 0.7 - 3.1 x10E3/uL   Monocytes Absolute 0.7 0.1 - 0.9 x10E3/uL   EOS (ABSOLUTE) 0.3 0.0 - 0.4 x10E3/uL   Basophils Absolute 0.1 0.0 - 0.2 x10E3/uL   Immature Granulocytes 0 Not Estab. %   Immature Grans (Abs) 0.0 0.0 - 0.1 x10E3/uL  Comprehensive metabolic panel     Status: None   Collection Time: 02/18/22 11:43 AM  Result Value Ref Range   Glucose 96 70 - 99 mg/dL   BUN 12 6 - 24 mg/dL   Creatinine, Ser 0.85 0.57 - 1.00 mg/dL   eGFR 81 >59 mL/min/1.73   BUN/Creatinine Ratio 14 9 - 23   Sodium 138 134 - 144 mmol/L   Potassium 4.8 3.5 - 5.2 mmol/L   Chloride 100 96 - 106 mmol/L   CO2 25 20 - 29 mmol/L   Calcium 9.4 8.7 - 10.2 mg/dL   Total Protein 7.1 6.0 - 8.5 g/dL   Albumin 4.7 3.8 - 4.9 g/dL  Globulin, Total 2.4 1.5 - 4.5 g/dL   Albumin/Globulin Ratio 2.0 1.2 - 2.2   Bilirubin Total 0.4 0.0 - 1.2 mg/dL   Alkaline Phosphatase 98 44 - 121 IU/L   AST 22 0 - 40 IU/L   ALT 17 0 - 32 IU/L  TSH + free T4     Status: None   Collection Time: 02/18/22 11:43 AM  Result Value Ref Range   TSH 0.639 0.450 - 4.500 uIU/mL   Free T4 1.30 0.82 - 1.77 ng/dL  Lipid Panel With LDL/HDL Ratio     Status: Abnormal   Collection Time: 02/18/22 11:43 AM  Result Value Ref Range   Cholesterol, Total 209 (H) 100 - 199 mg/dL   Triglycerides 52 0 - 149 mg/dL   HDL 71 >39 mg/dL   VLDL Cholesterol Cal 9 5 - 40 mg/dL   LDL Chol Calc  (NIH) 129 (H) 0 - 99 mg/dL   LDL/HDL Ratio 1.8 0.0 - 3.2 ratio    Comment:                                     LDL/HDL Ratio                                             Men  Women                               1/2 Avg.Risk  1.0    1.5                                   Avg.Risk  3.6    3.2                                2X Avg.Risk  6.2    5.0                                3X Avg.Risk  8.0    6.1   Hgb A1C w/o eAG     Status: Abnormal   Collection Time: 02/18/22 11:43 AM  Result Value Ref Range   Hgb A1c MFr Bld 5.9 (H) 4.8 - 5.6 %    Comment:          Prediabetes: 5.7 - 6.4          Diabetes: >6.4          Glycemic control for adults with diabetes: <7.0   B12 and Folate Panel     Status: None   Collection Time: 02/18/22 11:43 AM  Result Value Ref Range   Vitamin B-12 802 232 - 1,245 pg/mL   Folate 10.0 >3.0 ng/mL    Comment: A serum folate concentration of less than 3.1 ng/mL is considered to represent clinical deficiency.   VITAMIN D 25 Hydroxy (Vit-D Deficiency, Fractures)     Status: None   Collection Time: 02/18/22 11:43 AM  Result Value Ref Range   Vit D, 25-Hydroxy 48.0 30.0 - 100.0 ng/mL    Comment: Vitamin D deficiency has been defined  by the [place] practice guideline as a level of serum 25-OH vitamin D less than 20 ng/mL (1,2). The Endocrine Society went on to further define vitamin D insufficiency as a level between 21 and 29 ng/mL (2). 1. IOM (Institute of Medicine). 2010. Dietary reference    intakes for calcium and D. [place]: The    [employer]. 2. Holick MF, Binkley Hatfield, Bischoff-Ferrari HA, et al.    Evaluation, treatment, and prevention of vitamin D    deficiency: an Endocrine Society clinical practice    guideline. JCEM. 2011 Jul; 96(7):1911-30.   Fe+TIBC+Fer     Status: Abnormal   Collection Time: 02/18/22 11:43 AM  Result Value Ref Range   Total Iron Binding Capacity 403 250 - 450 ug/dL   UIBC  357 131 - 425 ug/dL   Iron 46 27 - 159 ug/dL   Iron Saturation 11 (L) 15 - 55 %   Ferritin 7 (L) 15 - 150 ng/mL        Assessment/Plan: 1. Encounter for general adult medical examination with abnormal findings CPE performed, due for mammogram and Pap however wants to defer Pap until next year.  Up-to-date on colonoscopy  2. Neurofibromatosis ([place]) Followed by dermatology and pain management  3. Iron deficiency anemia, unspecified iron deficiency anemia type We will update labs - CBC w/Diff/Platelet - Fe+TIBC+Fer  4. Hypercholesterolemia - Lipid Panel With LDL/HDL Ratio  5. Vitamin D deficiency - VITAMIN D 25 Hydroxy (Vit-D Deficiency, Fractures)  6. Vitamin B12 deficiency - B12 and Folate Panel  7. Elevated glucose - Comprehensive metabolic panel - Hgb V3X w/o eAG  8. Visit for screening mammogram Will order mammogram  9. Other fatigue - CBC w/Diff/Platelet - Comprehensive metabolic panel - TSH + free T4 - Lipid Panel With LDL/HDL Ratio - Hgb A1C w/o eAG - B12 and Folate Panel - VITAMIN D 25 Hydroxy (Vit-D Deficiency, Fractures)  10. Dysuria - UA/M w/rflx Culture, Routine - POCT Urinalysis Dipstick - CULTURE, URINE COMPREHENSIVE - ciprofloxacin (CIPRO) 500 MG tablet; Take 1 tablet (500 mg total) by mouth 2 (two) times daily for 10 days.  Dispense: 20 tablet; Refill: 0   General Counseling: Libni verbalizes understanding of the findings of todays visit and agrees with plan of treatment. I have discussed any further diagnostic evaluation that may be needed or ordered today. We also reviewed her medications today. she has been encouraged to call the office with any questions or concerns that should arise related to todays visit.    Counseling:    Orders Placed This Encounter  Procedures   CULTURE, URINE COMPREHENSIVE   UA/M w/rflx Culture, Routine   CBC w/Diff/Platelet   Comprehensive metabolic panel   TSH + free T4   Lipid Panel With LDL/HDL Ratio    Hgb A1C w/o eAG   B12 and Folate Panel   VITAMIN D 25 Hydroxy (Vit-D Deficiency, Fractures)   Fe+TIBC+Fer   POCT Urinalysis Dipstick    Meds ordered this encounter  Medications   ciprofloxacin (CIPRO) 500 MG tablet    Sig: Take 1 tablet (500 mg total) by mouth 2 (two) times daily for 10 days.    Dispense:  20 tablet    Refill:  0    This patient was seen by Drema Dallas, PA-C in collaboration with Dr. Clayborn Bigness as a part of collaborative care agreement.  Total time spent:35 Minutes  Time spent includes review of chart, medications, test results, and follow  up plan with the patient.     [name], MD  Internal Medicine

## 2022-02-18 ENCOUNTER — Other Ambulatory Visit: Payer: Self-pay

## 2022-02-18 ENCOUNTER — Telehealth: Payer: Self-pay

## 2022-02-18 ENCOUNTER — Other Ambulatory Visit: Payer: Self-pay | Admitting: Internal Medicine

## 2022-02-18 DIAGNOSIS — L601 Onycholysis: Secondary | ICD-10-CM

## 2022-02-18 DIAGNOSIS — Z1231 Encounter for screening mammogram for malignant neoplasm of breast: Secondary | ICD-10-CM

## 2022-02-18 MED ORDER — TOBRAMYCIN 0.3 % OP SOLN: OPHTHALMIC | 2 refills | Status: DC

## 2022-02-18 NOTE — Telephone Encounter (Signed)
Patient advised and RX sent in. aw 

## 2022-02-18 NOTE — Telephone Encounter (Signed)
Patient called regarding toenail issues again. You seen her late 2021/early 2022 for Onycholysis of her great toenails with pseudomonas infection. She states that she recently got dirt under a nail and dug at the skin to remove dirt and now has another infection. She is asking if you will RF her Tobramycin Solution? ? ?Pharmacy: [facility] ?

## 2022-02-19 LAB — IRON,TIBC AND FERRITIN PANEL
Ferritin: 7 ng/mL — ABNORMAL LOW (ref 15–150)
Iron Saturation: 11 % — ABNORMAL LOW (ref 15–55)
Iron: 46 ug/dL (ref 27–159)
Total Iron Binding Capacity: 403 ug/dL (ref 250–450)
UIBC: 357 ug/dL (ref 131–425)

## 2022-02-19 LAB — COMPREHENSIVE METABOLIC PANEL WITH GFR
ALT: 17 IU/L (ref 0–32)
AST: 22 IU/L (ref 0–40)
Albumin/Globulin Ratio: 2 (ref 1.2–2.2)
Albumin: 4.7 g/dL (ref 3.8–4.9)
Alkaline Phosphatase: 98 IU/L (ref 44–121)
BUN/Creatinine Ratio: 14 (ref 9–23)
BUN: 12 mg/dL (ref 6–24)
Bilirubin Total: 0.4 mg/dL (ref 0.0–1.2)
CO2: 25 mmol/L (ref 20–29)
Calcium: 9.4 mg/dL (ref 8.7–10.2)
Chloride: 100 mmol/L (ref 96–106)
Creatinine, Ser: 0.85 mg/dL (ref 0.57–1.00)
Globulin, Total: 2.4 g/dL (ref 1.5–4.5)
Glucose: 96 mg/dL (ref 70–99)
Potassium: 4.8 mmol/L (ref 3.5–5.2)
Sodium: 138 mmol/L (ref 134–144)
Total Protein: 7.1 g/dL (ref 6.0–8.5)
eGFR: 81 mL/min/1.73 (ref >59)

## 2022-02-19 LAB — LIPID PANEL WITH LDL/HDL RATIO
Cholesterol, Total: 209 mg/dL — ABNORMAL HIGH (ref 100–199)
HDL: 71 mg/dL (ref >39)
LDL Chol Calc (NIH): 129 mg/dL — ABNORMAL HIGH (ref 0–99)
LDL/HDL Ratio: 1.8 ratio (ref 0.0–3.2)
Triglycerides: 52 mg/dL (ref 0–149)
VLDL Cholesterol Cal: 9 mg/dL (ref 5–40)

## 2022-02-19 LAB — CULTURE, URINE COMPREHENSIVE

## 2022-02-19 LAB — CBC WITH DIFFERENTIAL/PLATELET
Basophils Absolute: 0.1 x10E3/uL (ref 0.0–0.2)
Basos: 1 %
EOS (ABSOLUTE): 0.3 x10E3/uL (ref 0.0–0.4)
Eos: 5 %
Hematocrit: 36.1 % (ref 34.0–46.6)
Hemoglobin: 11.2 g/dL (ref 11.1–15.9)
Immature Grans (Abs): 0 x10E3/uL (ref 0.0–0.1)
Immature Granulocytes: 0 %
Lymphocytes Absolute: 1.9 x10E3/uL (ref 0.7–3.1)
Lymphs: 27 %
MCH: 25.2 pg — ABNORMAL LOW (ref 26.6–33.0)
MCHC: 31 g/dL — ABNORMAL LOW (ref 31.5–35.7)
MCV: 81 fL (ref 79–97)
Monocytes Absolute: 0.7 x10E3/uL (ref 0.1–0.9)
Monocytes: 10 %
Neutrophils Absolute: 4 x10E3/uL (ref 1.4–7.0)
Neutrophils: 57 %
Platelets: 287 x10E3/uL (ref 150–450)
RBC: 4.45 x10E6/uL (ref 3.77–5.28)
RDW: 13.8 % (ref 11.7–15.4)
WBC: 7 x10E3/uL (ref 3.4–10.8)

## 2022-02-19 LAB — VITAMIN D 25 HYDROXY (VIT D DEFICIENCY, FRACTURES): Vit D, 25-Hydroxy: 48 ng/mL (ref 30.0–100.0)

## 2022-02-19 LAB — TSH+FREE T4
Free T4: 1.3 ng/dL (ref 0.82–1.77)
TSH: 0.639 u[IU]/mL (ref 0.450–4.500)

## 2022-02-19 LAB — HGB A1C W/O EAG: Hgb A1c MFr Bld: 5.9 % — ABNORMAL HIGH (ref 4.8–5.6)

## 2022-02-19 LAB — B12 AND FOLATE PANEL
Folate: 10 ng/mL (ref >3.0)
Vitamin B-12: 802 pg/mL (ref 232–1245)

## 2022-02-24 ENCOUNTER — Other Ambulatory Visit: Payer: Self-pay

## 2022-02-24 ENCOUNTER — Telehealth: Payer: Self-pay

## 2022-02-24 MED ORDER — AMOXICILLIN 500 MG PO TABS: 500.0000 mg | ORAL_TABLET | Freq: Two times a day (BID) | ORAL | 0 refills | Status: DC

## 2022-02-24 NOTE — Telephone Encounter (Signed)
Andrea Bernard spoke to pt and she is having UTI symptoms still and is ok with getting more Abx, and she advised she was planning on calling us back due to still having symptoms.  Per Lauren we sent Amoxicillin 500 mg BID x 7 days to [facility].

## 2022-02-24 NOTE — Telephone Encounter (Signed)
-----   Message from Mylinda Latina, PA-C sent at 02/21/2022  3:13 PM EDT ----- Please see if she still has UTI symptoms since completing cipro. If so then can send amoxicillin based on C/S

## 2022-03-13 ENCOUNTER — Encounter: Payer: Self-pay | Admitting: Physician Assistant

## 2022-03-13 ENCOUNTER — Ambulatory Visit (INDEPENDENT_AMBULATORY_CARE_PROVIDER_SITE_OTHER): Payer: BC Managed Care – PPO | Admitting: Physician Assistant

## 2022-03-13 DIAGNOSIS — E7800 Pure hypercholesterolemia, unspecified: Secondary | ICD-10-CM | POA: Diagnosis not present

## 2022-03-13 DIAGNOSIS — D508 Other iron deficiency anemias: Secondary | ICD-10-CM | POA: Diagnosis not present

## 2022-03-13 DIAGNOSIS — Z6829 Body mass index (BMI) 29.0-29.9, adult: Secondary | ICD-10-CM | POA: Diagnosis not present

## 2022-03-13 DIAGNOSIS — R7303 Prediabetes: Secondary | ICD-10-CM

## 2022-03-13 MED ORDER — RYBELSUS 3 MG PO TABS: 3.0000 mg | ORAL_TABLET | Freq: Every day | ORAL | 2 refills | Status: DC

## 2022-03-13 NOTE — Progress Notes (Signed)
[facility] [place], [place] [address]  Internal MEDICINE  Office Visit Note  Patient Name: Andrea Bernard  [id number]  [id number]  Date of Service: 03/13/2022  Chief Complaint  Patient presents with   Follow-up   Gastroesophageal Reflux    HPI Pt is here for routine follow up to review labs -Reviewed labs and show low iron. She has symptomatic iron deficiency and gets very ill with oral supplementation. Previously required iron infusions. She gets SOB with any exertion and feels fatigued. -Previously when iron is low she craves salty food and has been doing this recently which is contributing to poor diet -Cholesterol improved from last year but still elevated and thinks she can improve this further once she is not craving salt as much -A1c elevated into prediabetic range. Rybelsus samples given while script sent to help BG and wt loss goals.  Current Medication: Outpatient Encounter Medications as of 03/13/2022  Medication Sig   albuterol (VENTOLIN HFA) 108 (90 Base) MCG/ACT inhaler Inhale 2 puffs into the lungs every 6 (six) hours as needed for wheezing or shortness of breath.   amoxicillin (AMOXIL) 500 MG tablet Take 1 tablet (500 mg total) by mouth 2 (two) times daily. X 7 days   gabapentin (NEURONTIN) 300 MG capsule Take 300 mg by mouth 3 (three) times daily.   ibuprofen (ADVIL) 200 MG tablet Take 200 mg by mouth as needed.   OXYCONTIN 80 MG 12 hr tablet Take 80 mg by mouth every 12 (twelve) hours.   pantoprazole (PROTONIX) 40 MG tablet TAKE 1 TABLET BY MOUTH TWICE A DAY   Semaglutide (RYBELSUS) 3 MG TABS Take 3 mg by mouth daily.   SUMAtriptan (IMITREX) 100 MG tablet TAKE 1 TABLET BY MOUTH DAILY AS NEEDED FOR MIGRAINE.   tobramycin (TOBREX) 0.3 % ophthalmic solution Apply 1 drop to toenails QHS.   Facility-Administered Encounter Medications as of 03/13/2022  Medication   iron sucrose (VENOFER) 200 mg IVPB   sodium chloride flush (NS) 0.9 %  injection 10 mL    Surgical History: Past Surgical History:  Procedure Laterality Date   AUGMENTATION MAMMAPLASTY Bilateral 12/10/2020   BREAST ENHANCEMENT SURGERY Bilateral 12/04/2020   carpel tunnel release Bilateral    CESAREAN SECTION     x 4   COLONOSCOPY WITH PROPOFOL N/A 09/04/2017   Procedure: COLONOSCOPY WITH PROPOFOL;  Surgeon: [name], MD;  Location: [facility] ENDOSCOPY;  Service: Gastroenterology;  Laterality: N/A;   COLONOSCOPY WITH PROPOFOL N/A 12/21/2017   Procedure: COLONOSCOPY WITH PROPOFOL;  Surgeon: [name], MD;  Location: [facility] ENDOSCOPY;  Service: Gastroenterology;  Laterality: N/A;   ESOPHAGOGASTRODUODENOSCOPY (EGD) WITH PROPOFOL N/A 09/04/2017   Procedure: ESOPHAGOGASTRODUODENOSCOPY (EGD) WITH PROPOFOL;  Surgeon: [name], MD;  Location: [facility] ENDOSCOPY;  Service: Gastroenterology;  Laterality: N/A;   HERNIA REPAIR     HERNIA REPAIR  12/04/2020   TUBAL LIGATION     tummy tuck  12/04/2020   removed 20 masses on stomach   TUMOR REMOVAL     x8    Medical History: Past Medical History:  Diagnosis Date   Arthritis    Dysplastic nevus 03/02/2008   Right lateral thigh. Moderate atypia, limited margins free.   GERD (gastroesophageal reflux disease)    Headache    Iron deficiency anemia 06/15/2017   Neurofibromatosis ([place])     Family History: Family History  Problem Relation Age of Onset   Anemia Mother    Lupus Mother    Sjogren's syndrome Mother  Rheum arthritis Mother    Schizophrenia Father    Neurofibromatosis Brother    Neurofibromatosis Son    Neurofibromatosis Son    Neurofibromatosis Daughter    Bladder Cancer Neg Hx    Kidney cancer Neg Hx    Breast cancer Neg Hx    Ovarian cancer Neg Hx    Colon cancer Neg Hx     Social History   Socioeconomic History   Marital status: Married    Spouse name: Not on file   Number of children: Not on file   Years of education: Not on file   Highest education level: Not on file  Occupational  History   Not on file  Tobacco Use   Smoking status: Former    Types: Cigarettes   Smokeless tobacco: Never   Tobacco comments:    Age [age]  Vaping Use   Vaping Use: Never used  Substance and Sexual Activity   Alcohol use: No   Drug use: Not Currently   Sexual activity: Yes    Birth control/protection: Surgical  Other Topics Concern   Not on file  Social History Narrative   Not on file   Social Determinants of Health   Financial Resource Strain: Not on file  Food Insecurity: Not on file  Transportation Needs: Not on file  Physical Activity: Not on file  Stress: Not on file  Social Connections: Not on file  Intimate Partner Violence: Not on file      Review of Systems  Constitutional:  Positive for fatigue. Negative for chills and unexpected weight change.  HENT:  Negative for congestion, postnasal drip, rhinorrhea, sneezing and sore throat.   Eyes:  Negative for redness.  Respiratory:  Positive for shortness of breath. Negative for cough and chest tightness.   Cardiovascular:  Negative for chest pain and palpitations.  Gastrointestinal:  Negative for abdominal pain, constipation, diarrhea, nausea and vomiting.  Genitourinary:  Negative for dysuria and frequency.  Musculoskeletal:  Positive for arthralgias. Negative for back pain, joint swelling and neck pain.  Skin:  Negative for rash.  Neurological: Negative.  Negative for tremors and numbness.  Hematological:  Negative for adenopathy. Does not bruise/bleed easily.  Psychiatric/Behavioral:  Negative for behavioral problems (Depression), sleep disturbance and suicidal ideas. The patient is not nervous/anxious.     Vital Signs: BP 131/80   Pulse 64   Temp 98.4 F (36.9 C)   Resp 16   Ht '5\' 4"'$  (1.626 m)   Wt 172 lb 6.4 oz (78.2 kg)   LMP 12/18/2017   SpO2 97%   BMI 29.59 kg/m    Physical Exam Vitals and nursing note reviewed.  Constitutional:      General: She is not in acute distress.    Appearance:  She is well-developed. She is not diaphoretic.  HENT:     Head: Normocephalic and atraumatic.     Mouth/Throat:     Pharynx: No oropharyngeal exudate.  Eyes:     Pupils: Pupils are equal, round, and reactive to light.  Neck:     Thyroid: No thyromegaly.     Vascular: No JVD.     Trachea: No tracheal deviation.  Cardiovascular:     Rate and Rhythm: Normal rate and regular rhythm.     Heart sounds: Normal heart sounds. No murmur heard.    No friction rub. No gallop.  Pulmonary:     Effort: Pulmonary effort is normal. No respiratory distress.     Breath sounds: No wheezing or  rales.  Chest:     Chest wall: No tenderness.  Breasts:    Right: Normal. No mass.     Left: No mass.  Abdominal:     General: Bowel sounds are normal.     Palpations: Abdomen is soft.     Tenderness: There is no abdominal tenderness.  Musculoskeletal:        General: Normal range of motion.     Cervical back: Normal range of motion and neck supple.  Lymphadenopathy:     Cervical: No cervical adenopathy.  Skin:    General: Skin is warm and dry.  Neurological:     Mental Status: She is alert and oriented to person, place, and time.     Cranial Nerves: No cranial nerve deficit.  Psychiatric:        Behavior: Behavior normal.        Thought Content: Thought content normal.        Judgment: Judgment normal.        Assessment/Plan: 1. Prediabetes A1c elevated to 5.9 on recent labs. Will start on rybelsus to help BG control as well as weight loss goals. Sample given while script sent. Continue to work on diet and exercise - Semaglutide (RYBELSUS) 3 MG TABS; Take 3 mg by mouth daily.  Dispense: 30 tablet; Refill: 2  2. Other iron deficiency anemia Patient fatigued and SOB with exertion with low iron and borderline hgb. Cant tolerate PO iron. Will refer to hematology - Ambulatory referral to Hematology / Oncology  3. Hypercholesterolemia Improving from last year but still elevated, discussed  improving diet and exercise and considering statin if not improving further  4. BMI 29.0-29.9,adult Will work on diet and exercise and start rybelsus for BG control and wt loss benefits   General Counseling: Jesika verbalizes understanding of the findings of todays visit and agrees with plan of treatment. I have discussed any further diagnostic evaluation that may be needed or ordered today. We also reviewed her medications today. she has been encouraged to call the office with any questions or concerns that should arise related to todays visit.    Orders Placed This Encounter  Procedures   Ambulatory referral to Hematology / Oncology    Meds ordered this encounter  Medications   Semaglutide (RYBELSUS) 3 MG TABS    Sig: Take 3 mg by mouth daily.    Dispense:  30 tablet    Refill:  2    This patient was seen by Drema Dallas, PA-C in collaboration with Dr. Clayborn Bigness as a part of collaborative care agreement.   Total time spent:30 Minutes Time spent includes review of chart, medications, test results, and follow up plan with the patient.      Dr [name] Internal medicine

## 2022-03-19 ENCOUNTER — Other Ambulatory Visit: Payer: Self-pay | Admitting: Physician Assistant

## 2022-03-19 DIAGNOSIS — Q8500 Neurofibromatosis, unspecified: Secondary | ICD-10-CM

## 2022-03-19 DIAGNOSIS — G894 Chronic pain syndrome: Secondary | ICD-10-CM

## 2022-03-19 DIAGNOSIS — Z0001 Encounter for general adult medical examination with abnormal findings: Secondary | ICD-10-CM

## 2022-03-19 DIAGNOSIS — K219 Gastro-esophageal reflux disease without esophagitis: Secondary | ICD-10-CM

## 2022-03-19 DIAGNOSIS — L601 Onycholysis: Secondary | ICD-10-CM

## 2022-03-19 DIAGNOSIS — Z9882 Breast implant status: Secondary | ICD-10-CM

## 2022-03-19 DIAGNOSIS — R300 Dysuria: Secondary | ICD-10-CM

## 2022-03-19 DIAGNOSIS — D509 Iron deficiency anemia, unspecified: Secondary | ICD-10-CM

## 2022-03-19 DIAGNOSIS — E7800 Pure hypercholesterolemia, unspecified: Secondary | ICD-10-CM

## 2022-03-19 DIAGNOSIS — G43909 Migraine, unspecified, not intractable, without status migrainosus: Secondary | ICD-10-CM

## 2022-03-19 DIAGNOSIS — N644 Mastodynia: Secondary | ICD-10-CM

## 2022-03-20 ENCOUNTER — Ambulatory Visit
Admission: RE | Admit: 2022-03-20 | Discharge: 2022-03-20 | Disposition: A | Discharge status: Home / Self Care | Payer: BC Managed Care – PPO | Source: Ambulatory Visit | Attending: Internal Medicine | Admitting: Internal Medicine

## 2022-03-20 DIAGNOSIS — Z1231 Encounter for screening mammogram for malignant neoplasm of breast: Secondary | ICD-10-CM

## 2022-03-24 ENCOUNTER — Inpatient Hospital Stay: Payer: BC Managed Care – PPO

## 2022-03-24 ENCOUNTER — Inpatient Hospital Stay: Payer: BC Managed Care – PPO | Attending: Oncology | Admitting: Oncology

## 2022-03-24 ENCOUNTER — Encounter: Payer: Self-pay | Admitting: Oncology

## 2022-03-24 VITALS — BP 114/72 | HR 59 | Temp 96.4°F | Ht 64.0 in | Wt 171.0 lb

## 2022-03-24 DIAGNOSIS — Q8501 Neurofibromatosis, type 1: Secondary | ICD-10-CM | POA: Diagnosis not present

## 2022-03-24 DIAGNOSIS — D509 Iron deficiency anemia, unspecified: Secondary | ICD-10-CM

## 2022-03-24 DIAGNOSIS — Z87891 Personal history of nicotine dependence: Secondary | ICD-10-CM

## 2022-03-24 DIAGNOSIS — Z809 Family history of malignant neoplasm, unspecified: Secondary | ICD-10-CM

## 2022-03-24 NOTE — Assessment & Plan Note (Signed)
Labs reviewed and discussed with patient Consistent with iron deficiency anemia. Etiology of iron deficiency, consider GI blood loss.  Patient is postmenopausal Recommend GI work-up. Patient has previously tolerated IV Venofer treatments very well. Recommend IV Venofer weekly x4.

## 2022-03-24 NOTE — Progress Notes (Signed)
Hematology/Oncology Consult note [phone] [phone]      Patient Care Team: Carolynne Edouard as PCP - General (Physician Assistant)  REFERRING PROVIDER: Mylinda Latina, PA*  CHIEF COMPLAINTS/REASON FOR VISIT:  Anemia  ASSESSMENT & PLAN:  Iron deficiency anemia Labs reviewed and discussed with patient Consistent with iron deficiency anemia. Etiology of iron deficiency, consider GI blood loss.  Patient is postmenopausal Recommend GI work-up. Patient has previously tolerated IV Venofer treatments very well. Recommend IV Venofer weekly x4.  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 Recommend mammogram, consider MRI for breast cancer screening.   Orders Placed This Encounter  Procedures   CBC with Differential/Platelet    Standing Status:   Future    Standing Expiration Date:   03/25/2023   Ferritin    Standing Status:   Future    Standing Expiration Date:   03/25/2023   Iron and TIBC    Standing Status:   Future    Standing Expiration Date:   03/25/2023   Ambulatory referral to Gastroenterology    Referral Priority:   Routine    Referral Type:   Consultation    Referral Reason:   Specialty Services Required    Referred to Provider:   [name], MD    Number of Visits Requested:   1   Follow-up in 4 months with repeat blood work and assessment of additional need of IV iron. All questions were answered. The patient knows to call the clinic with any problems, questions or concerns.  Earlie Server, MD, PhD [facility] Hematology Oncology 03/24/2022     HISTORY OF PRESENTING ILLNESS:  Andrea Bernard is a  55 y.o.  female with PMH listed below who was referred to me for anemia Reviewed patient's recent labs that was done.  She was found to have abnormal CBC on 02/18/2022, hemoglobin 11.2.  MCV 81.  Iron panel showed iron saturation 11, ferritin  7.  Patient was previously seen by me 4 years ago for iron deficiency anemia and previously tolerated IV Venofer treatments. She is postmenopausal, LMP January 2020.  She is not able to tolerate oral iron supplementation. + Fatigue, shortness of breath with exertion. She denies recent chest pain on exertion, pre-syncopal episodes, or palpitations She had not noticed any recent bleeding such as epistaxis, hematuria or hematochezia.  Patient uses over-the-counter NSAIDs as needed for arthritis. Her last colonoscopy was 2019. She denies any pica and eats a variety of diet.  MEDICAL HISTORY:  Past Medical History:  Diagnosis Date   Arthritis    Dysplastic nevus 03/02/2008   Right lateral thigh. Moderate atypia, limited margins free.   GERD (gastroesophageal reflux disease)    Headache    Iron deficiency anemia 06/15/2017   Neurofibromatosis [facility])     SURGICAL HISTORY: Past Surgical History:  Procedure Laterality Date   AUGMENTATION MAMMAPLASTY Bilateral 12/10/2020   BREAST ENHANCEMENT SURGERY Bilateral 12/04/2020   carpel tunnel release Bilateral    CESAREAN SECTION     x 4   COLONOSCOPY WITH PROPOFOL N/A 09/04/2017   Procedure: COLONOSCOPY WITH PROPOFOL;  Surgeon: [name], MD;  Location: [facility] ENDOSCOPY;  Service: Gastroenterology;  Laterality: N/A;   COLONOSCOPY WITH PROPOFOL N/A 12/21/2017   Procedure: COLONOSCOPY WITH PROPOFOL;  Surgeon: [name], MD;  Location: [facility] ENDOSCOPY;  Service: Gastroenterology;  Laterality: N/A;   ESOPHAGOGASTRODUODENOSCOPY (EGD) WITH PROPOFOL N/A 09/04/2017   Procedure: ESOPHAGOGASTRODUODENOSCOPY (  EGD) WITH PROPOFOL;  Surgeon: [name], MD;  Location: [facility] ENDOSCOPY;  Service: Gastroenterology;  Laterality: N/A;   HERNIA REPAIR     HERNIA REPAIR  12/04/2020   TUBAL LIGATION     tummy tuck  12/04/2020   removed 20 masses on stomach   TUMOR REMOVAL     x8    SOCIAL HISTORY: Social History   Socioeconomic History   Marital status:  Married    Spouse name: Not on file   Number of children: Not on file   Years of education: Not on file   Highest education level: Not on file  Occupational History   Not on file  Tobacco Use   Smoking status: Former    Types: Cigarettes   Smokeless tobacco: Never   Tobacco comments:    Age [age]  Vaping Use   Vaping Use: Never used  Substance and Sexual Activity   Alcohol use: No   Drug use: Not Currently   Sexual activity: Yes    Birth control/protection: Surgical  Other Topics Concern   Not on file  Social History Narrative   Not on file   Social Determinants of Health   Financial Resource Strain: Not on file  Food Insecurity: Not on file  Transportation Needs: Not on file  Physical Activity: Not on file  Stress: Not on file  Social Connections: Not on file  Intimate Partner Violence: Not on file    FAMILY HISTORY: Family History  Problem Relation Age of Onset   Anemia Mother    Lupus Mother    Sjogren's syndrome Mother    Rheum arthritis Mother    Schizophrenia Father    Neurofibromatosis Brother    Neurofibromatosis Son    Neurofibromatosis Son    Neurofibromatosis Daughter    Bladder Cancer Neg Hx    Kidney cancer Neg Hx    Breast cancer Neg Hx    Ovarian cancer Neg Hx    Colon cancer Neg Hx     ALLERGIES:  is allergic to nitrofuran derivatives.  MEDICATIONS:  Current Outpatient Medications  Medication Sig Dispense Refill   gabapentin (NEURONTIN) 300 MG capsule Take 300 mg by mouth 3 (three) times daily.  0   ibuprofen (ADVIL) 200 MG tablet Take 200 mg by mouth as needed.     OXYCONTIN 80 MG 12 hr tablet Take 80 mg by mouth every 12 (twelve) hours.  0   pantoprazole (PROTONIX) 40 MG tablet TAKE 1 TABLET BY MOUTH TWICE A DAY 180 tablet 1   Semaglutide (RYBELSUS) 3 MG TABS Take 3 mg by mouth daily. 30 tablet 2   SUMAtriptan (IMITREX) 100 MG tablet TAKE 1 TABLET BY MOUTH DAILY AS NEEDED FOR MIGRAINE. 9 tablet 3   tobramycin (TOBREX) 0.3 %  ophthalmic solution Apply 1 drop to toenails QHS. 5 mL 2   albuterol (VENTOLIN HFA) 108 (90 Base) MCG/ACT inhaler Inhale 2 puffs into the lungs every 6 (six) hours as needed for wheezing or shortness of breath. (Patient not taking: Reported on 03/24/2022) 8 g 2   amoxicillin (AMOXIL) 500 MG tablet Take 1 tablet (500 mg total) by mouth 2 (two) times daily. X 7 days (Patient not taking: Reported on 03/24/2022) 14 tablet 0   No current facility-administered medications for this visit.   Facility-Administered Medications Ordered in Other Visits  Medication Dose Route Frequency Provider Last Rate Last Admin   iron sucrose (VENOFER) 200 mg IVPB  200 mg Intravenous Weekly Earlie Server, MD  sodium chloride flush (NS) 0.9 % injection 10 mL  10 mL Intracatheter PRN Earlie Server, MD        Review of Systems  Constitutional:  Positive for fatigue. Negative for appetite change, chills and fever.  HENT:   Negative for hearing loss and voice change.   Eyes:  Negative for eye problems.  Respiratory:  Negative for chest tightness and cough.   Cardiovascular:  Negative for chest pain.  Gastrointestinal:  Negative for abdominal distention, abdominal pain and blood in stool.  Endocrine: Negative for hot flashes.  Genitourinary:  Negative for difficulty urinating and frequency.   Musculoskeletal:  Positive for arthralgias.  Skin:  Negative for itching and rash.  Neurological:  Negative for extremity weakness.  Hematological:  Negative for adenopathy.  Psychiatric/Behavioral:  Negative for confusion.     PHYSICAL EXAMINATION: ECOG PERFORMANCE STATUS: 1 - Symptomatic but completely ambulatory Vitals:   03/24/22 0941  BP: 114/72  Pulse: (!) 59  Temp: (!) 96.4 F (35.8 C)   Filed Weights   03/24/22 0941  Weight: 171 lb (77.6 kg)    Physical Exam Constitutional:      General: She is not in acute distress. HENT:     Head: Normocephalic and atraumatic.  Eyes:     General: No scleral  icterus. Cardiovascular:     Rate and Rhythm: Normal rate and regular rhythm.     Heart sounds: Normal heart sounds.  Pulmonary:     Effort: Pulmonary effort is normal. No respiratory distress.     Breath sounds: No wheezing.  Abdominal:     General: Bowel sounds are normal. There is no distension.     Palpations: Abdomen is soft.  Musculoskeletal:        General: No deformity. Normal range of motion.     Cervical back: Normal range of motion and neck supple.  Skin:    General: Skin is warm and dry.     Findings: No erythema or rash.  Neurological:     Mental Status: She is alert and oriented to person, place, and time. Mental status is at baseline.     Cranial Nerves: No cranial nerve deficit.     Coordination: Coordination normal.  Psychiatric:        Mood and Affect: Mood normal.      LABORATORY DATA:  I have reviewed the data as listed Lab Results  Component Value Date   WBC 7.0 02/18/2022   HGB 11.2 02/18/2022   HCT 36.1 02/18/2022   MCV 81 02/18/2022   PLT 287 02/18/2022   Lab Results  Component Value Date   NA 138 02/18/2022   K 4.8 02/18/2022   CL 100 02/18/2022   CO2 25 02/18/2022      Component Value Date/Time   IRON 46 02/18/2022 1143   TIBC 403 02/18/2022 1143   FERRITIN 7 (L) 02/18/2022 1143   IRONPCTSAT 11 (L) 02/18/2022 1143     RADIOGRAPHIC STUDIES: I have personally reviewed the radiological images as listed and agreed with the findings in the report. MM 3D SCREEN BREAST W/IMPLANT BILATERAL  Result Date: 03/21/2022 CLINICAL DATA:  Screening. EXAM: DIGITAL SCREENING BILATERAL MAMMOGRAM WITH IMPLANTS, CAD AND TOMOSYNTHESIS TECHNIQUE: Bilateral screening digital craniocaudal and mediolateral oblique mammograms were obtained. Bilateral screening digital breast tomosynthesis was performed. The images were evaluated with computer-aided detection. Standard and/or implant displaced views were performed. COMPARISON:  Previous exam(s). ACR Breast Density  Category d: The breast tissue is extremely dense, which lowers the  sensitivity of mammography. FINDINGS: The patient has retroglandular implants. There are no findings suspicious for malignancy. IMPRESSION: No mammographic evidence of malignancy. A result letter of this screening mammogram will be mailed directly to the patient. RECOMMENDATION: Screening mammogram in one year. (Code:SM-B-01Y) BI-RADS CATEGORY  1:  Negative. Electronically Signed   By: Fidela Salisbury M.D.   On: 03/21/2022 13:14

## 2022-03-24 NOTE — Assessment & Plan Note (Signed)
Continue follow-up with neurology. 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 Recommend mammogram, consider MRI for breast cancer screening.

## 2022-03-31 ENCOUNTER — Inpatient Hospital Stay: Payer: BC Managed Care – PPO

## 2022-04-01 ENCOUNTER — Inpatient Hospital Stay: Payer: BC Managed Care – PPO

## 2022-04-01 VITALS — BP 120/71 | HR 61 | Temp 96.0°F | Resp 19

## 2022-04-01 DIAGNOSIS — D509 Iron deficiency anemia, unspecified: Secondary | ICD-10-CM | POA: Diagnosis not present

## 2022-04-01 DIAGNOSIS — D508 Other iron deficiency anemias: Secondary | ICD-10-CM

## 2022-04-01 MED ORDER — SODIUM CHLORIDE 0.9 % IV SOLN: 200.0000 mg | INTRAVENOUS | Status: DC

## 2022-04-01 MED ORDER — IRON SUCROSE 20 MG/ML IV SOLN
200.0000 mg | Freq: Once | INTRAVENOUS | Status: AC
  Administered 2022-04-01: 200 mg via INTRAVENOUS
  Filled 2022-04-01: qty 10

## 2022-04-01 MED ORDER — SODIUM CHLORIDE 0.9 % IV SOLN
Freq: Once | INTRAVENOUS | Status: AC
  Filled 2022-04-01: qty 250

## 2022-04-03 ENCOUNTER — Ambulatory Visit: Payer: BC Managed Care – PPO

## 2022-04-10 ENCOUNTER — Inpatient Hospital Stay: Payer: BC Managed Care – PPO | Attending: Oncology

## 2022-04-10 VITALS — BP 117/65 | HR 63 | Temp 96.1°F

## 2022-04-10 DIAGNOSIS — D509 Iron deficiency anemia, unspecified: Secondary | ICD-10-CM | POA: Insufficient documentation

## 2022-04-10 DIAGNOSIS — D508 Other iron deficiency anemias: Secondary | ICD-10-CM

## 2022-04-10 MED ORDER — SODIUM CHLORIDE 0.9 % IV SOLN
INTRAVENOUS | Status: DC
  Filled 2022-04-10: qty 250

## 2022-04-10 MED ORDER — SODIUM CHLORIDE 0.9 % IV SOLN
200.0000 mg | INTRAVENOUS | Status: DC
  Administered 2022-04-10: 200 mg via INTRAVENOUS
  Filled 2022-04-10: qty 10

## 2022-04-10 MED ORDER — SODIUM CHLORIDE 0.9 % IV SOLN: 200.0000 mg | Freq: Once | INTRAVENOUS | Status: DC

## 2022-04-10 MED FILL — Iron Sucrose Inj 20 MG/ML (Fe Equiv): INTRAVENOUS | Qty: 10 | Status: AC

## 2022-04-17 ENCOUNTER — Inpatient Hospital Stay: Payer: BC Managed Care – PPO

## 2022-04-17 ENCOUNTER — Other Ambulatory Visit: Payer: Self-pay | Admitting: Physician Assistant

## 2022-04-17 ENCOUNTER — Encounter: Payer: Self-pay | Admitting: Physician Assistant

## 2022-04-17 ENCOUNTER — Ambulatory Visit (INDEPENDENT_AMBULATORY_CARE_PROVIDER_SITE_OTHER): Payer: BC Managed Care – PPO | Admitting: Physician Assistant

## 2022-04-17 VITALS — BP 107/77 | HR 58 | Temp 97.9°F | Resp 16

## 2022-04-17 VITALS — BP 114/65 | HR 70 | Temp 98.5°F | Resp 16 | Ht 64.0 in | Wt 171.8 lb

## 2022-04-17 DIAGNOSIS — R300 Dysuria: Secondary | ICD-10-CM | POA: Diagnosis not present

## 2022-04-17 DIAGNOSIS — D509 Iron deficiency anemia, unspecified: Secondary | ICD-10-CM

## 2022-04-17 DIAGNOSIS — R7303 Prediabetes: Secondary | ICD-10-CM | POA: Diagnosis not present

## 2022-04-17 DIAGNOSIS — D508 Other iron deficiency anemias: Secondary | ICD-10-CM

## 2022-04-17 LAB — POCT URINALYSIS DIPSTICK
Glucose, UA: NEGATIVE
Leukocytes, UA: NEGATIVE
Nitrite, UA: NEGATIVE
Protein, UA: NEGATIVE
Spec Grav, UA: 1.025 (ref 1.010–1.025)
Urobilinogen, UA: 0.2 E.U./dL
pH, UA: 5 (ref 5.0–8.0)

## 2022-04-17 MED ORDER — SODIUM CHLORIDE 0.9 % IV SOLN
Freq: Once | INTRAVENOUS | Status: AC
  Filled 2022-04-17: qty 250

## 2022-04-17 MED ORDER — FLUCONAZOLE 150 MG PO TABS: ORAL_TABLET | ORAL | 0 refills | Status: DC

## 2022-04-17 MED ORDER — OZEMPIC (0.25 OR 0.5 MG/DOSE) 2 MG/1.5ML ~~LOC~~ SOPN: PEN_INJECTOR | SUBCUTANEOUS | 0 refills | Status: DC

## 2022-04-17 MED ORDER — SODIUM CHLORIDE 0.9 % IV SOLN
200.0000 mg | Freq: Once | INTRAVENOUS | Status: AC
  Administered 2022-04-17: 200 mg via INTRAVENOUS
  Filled 2022-04-17: qty 10

## 2022-04-17 MED ORDER — AMOXICILLIN 500 MG PO CAPS: 500.0000 mg | ORAL_CAPSULE | Freq: Two times a day (BID) | ORAL | 0 refills | Status: AC

## 2022-04-17 MED ORDER — FLUCONAZOLE 150 MG PO TABS: ORAL_TABLET | ORAL | 2 refills | Status: DC

## 2022-04-17 NOTE — Addendum Note (Signed)
Addended by: Edd Arbour B on: 04/17/2022 11:39 AM   Modules accepted: Orders

## 2022-04-17 NOTE — Progress Notes (Signed)
[facility], [place] [address]  Internal MEDICINE  Office Visit Note  Patient Name: Andrea Bernard  [id number]  [id number]  Date of Service: 04/17/2022  Chief Complaint  Patient presents with   Follow-up    Wheezing for 4 to 5 weeks    Gastroesophageal Reflux   Anemia   Urinary Tract Infection    Frequency and pressure, urgency, for 2 weeks    Weight Loss    HPI Pt is here for routine follow up for weight loss and BG control -Continues to have UTI symptoms. Never really felt like it completely resolved after ABX last time. Does state she often needs longer course of ABX and urine today is abnormal and will be sent for culture. -May need renal US if culture is negative to work up further especially given blood. If positive culture then may need to consider urology/gyn referral due to repeat UTIs. Pt states she wipes front to back and is very careful about this. She does state she has sex with one partner 5 times per week and always urinates after and tries to make sure everything is very clean -She has been having acid reflux on rybelsus, but not nauseous and is interested in changing to ozempic to avoid oral route -Doing well on iron infusions and feeling better, not craving salt anymore -No wheezing in office today  Current Medication: Outpatient Encounter Medications as of 04/17/2022  Medication Sig   amoxicillin (AMOXIL) 500 MG capsule Take 1 capsule (500 mg total) by mouth 2 (two) times daily for 10 days.   fluconazole (DIFLUCAN) 150 MG tablet Take 1 tablet by mouth. If symptoms persist after 3 days may take another dose.   gabapentin (NEURONTIN) 300 MG capsule Take 300 mg by mouth 3 (three) times daily.   ibuprofen (ADVIL) 200 MG tablet Take 200 mg by mouth as needed.   OXYCONTIN 80 MG 12 hr tablet Take 80 mg by mouth every 12 (twelve) hours.   pantoprazole (PROTONIX) 40 MG tablet TAKE 1 TABLET BY MOUTH TWICE A DAY   Semaglutide,0.25 or  0.'5MG'$ /DOS, (OZEMPIC, 0.25 OR 0.5 MG/DOSE,) 2 MG/1.5ML SOPN Inject 0.'25mg'$  into the skin once a week for 4 weeks then increase to 0.'5mg'$  weekly.   SUMAtriptan (IMITREX) 100 MG tablet TAKE 1 TABLET BY MOUTH DAILY AS NEEDED FOR MIGRAINE.   tobramycin (TOBREX) 0.3 % ophthalmic solution Apply 1 drop to toenails QHS.   [DISCONTINUED] Semaglutide (RYBELSUS) 3 MG TABS Take 3 mg by mouth daily.   [DISCONTINUED] albuterol (VENTOLIN HFA) 108 (90 Base) MCG/ACT inhaler Inhale 2 puffs into the lungs every 6 (six) hours as needed for wheezing or shortness of breath. (Patient not taking: Reported on 03/24/2022)   [DISCONTINUED] amoxicillin (AMOXIL) 500 MG tablet Take 1 tablet (500 mg total) by mouth 2 (two) times daily. X 7 days (Patient not taking: Reported on 03/24/2022)   Facility-Administered Encounter Medications as of 04/17/2022  Medication   iron sucrose (VENOFER) 200 mg IVPB   sodium chloride flush (NS) 0.9 % injection 10 mL    Surgical History: Past Surgical History:  Procedure Laterality Date   AUGMENTATION MAMMAPLASTY Bilateral 12/10/2020   BREAST ENHANCEMENT SURGERY Bilateral 12/04/2020   carpel tunnel release Bilateral    CESAREAN SECTION     x 4   COLONOSCOPY WITH PROPOFOL N/A 09/04/2017   Procedure: COLONOSCOPY WITH PROPOFOL;  Surgeon: [name], MD;  Location: [facility] ENDOSCOPY;  Service: Gastroenterology;  Laterality: N/A;   COLONOSCOPY WITH PROPOFOL  N/A 12/21/2017   Procedure: COLONOSCOPY WITH PROPOFOL;  Surgeon: [name], MD;  Location: [facility] ENDOSCOPY;  Service: Gastroenterology;  Laterality: N/A;   ESOPHAGOGASTRODUODENOSCOPY (EGD) WITH PROPOFOL N/A 09/04/2017   Procedure: ESOPHAGOGASTRODUODENOSCOPY (EGD) WITH PROPOFOL;  Surgeon: [name], MD;  Location: [facility] ENDOSCOPY;  Service: Gastroenterology;  Laterality: N/A;   HERNIA REPAIR     HERNIA REPAIR  12/04/2020   TUBAL LIGATION     tummy tuck  12/04/2020   removed 20 masses on stomach   TUMOR REMOVAL     x8    Medical  History: Past Medical History:  Diagnosis Date   Arthritis    Dysplastic nevus 03/02/2008   Right lateral thigh. Moderate atypia, limited margins free.   GERD (gastroesophageal reflux disease)    Headache    Iron deficiency anemia 06/15/2017   Neurofibromatosis ([place])     Family History: Family History  Problem Relation Age of Onset   Anemia Mother    Lupus Mother    Sjogren's syndrome Mother    Rheum arthritis Mother    Schizophrenia Father    Neurofibromatosis Brother    Neurofibromatosis Son    Neurofibromatosis Son    Neurofibromatosis Daughter    Bladder Cancer Neg Hx    Kidney cancer Neg Hx    Breast cancer Neg Hx    Ovarian cancer Neg Hx    Colon cancer Neg Hx     Social History   Socioeconomic History   Marital status: Married    Spouse name: Not on file   Number of children: Not on file   Years of education: Not on file   Highest education level: Not on file  Occupational History   Not on file  Tobacco Use   Smoking status: Former    Types: Cigarettes   Smokeless tobacco: Never   Tobacco comments:    Age [age]  Vaping Use   Vaping Use: Never used  Substance and Sexual Activity   Alcohol use: No   Drug use: Not Currently   Sexual activity: Yes    Birth control/protection: Surgical  Other Topics Concern   Not on file  Social History Narrative   Not on file   Social Determinants of Health   Financial Resource Strain: Not on file  Food Insecurity: Not on file  Transportation Needs: Not on file  Physical Activity: Not on file  Stress: Not on file  Social Connections: Not on file  Intimate Partner Violence: Not on file      Review of Systems  Constitutional:  Positive for fatigue. Negative for chills and unexpected weight change.  HENT:  Negative for congestion, postnasal drip, rhinorrhea, sneezing and sore throat.   Eyes:  Negative for redness.  Respiratory:  Positive for wheezing. Negative for cough and chest tightness.   Cardiovascular:   Negative for chest pain and palpitations.  Gastrointestinal:  Negative for abdominal pain, constipation, diarrhea, nausea and vomiting.  Genitourinary:  Positive for dysuria, frequency and urgency.  Musculoskeletal:  Positive for arthralgias and back pain. Negative for joint swelling and neck pain.  Skin:  Negative for rash.  Neurological: Negative.  Negative for tremors and numbness.  Hematological:  Negative for adenopathy. Does not bruise/bleed easily.  Psychiatric/Behavioral:  Negative for behavioral problems (Depression), sleep disturbance and suicidal ideas. The patient is not nervous/anxious.     Vital Signs: BP 114/65   Pulse 70   Temp 98.5 F (36.9 C)   Resp 16   Ht '5\' 4"'$  (1.626 m)  Wt 171 lb 12.8 oz (77.9 kg)   LMP 10/06/2018   SpO2 97%   BMI 29.49 kg/m    Physical Exam Vitals and nursing note reviewed.  Constitutional:      General: She is not in acute distress.    Appearance: She is well-developed. She is not diaphoretic.  HENT:     Head: Normocephalic and atraumatic.     Mouth/Throat:     Pharynx: No oropharyngeal exudate.  Eyes:     Pupils: Pupils are equal, round, and reactive to light.  Neck:     Thyroid: No thyromegaly.     Vascular: No JVD.     Trachea: No tracheal deviation.  Cardiovascular:     Rate and Rhythm: Normal rate and regular rhythm.     Heart sounds: Normal heart sounds. No murmur heard.    No friction rub. No gallop.  Pulmonary:     Effort: Pulmonary effort is normal. No respiratory distress.     Breath sounds: No wheezing or rales.  Chest:     Chest wall: No tenderness.  Breasts:    Right: Normal. No mass.     Left: No mass.  Abdominal:     General: Bowel sounds are normal.     Palpations: Abdomen is soft.     Tenderness: There is no abdominal tenderness.  Musculoskeletal:        General: Normal range of motion.     Cervical back: Normal range of motion and neck supple.  Lymphadenopathy:     Cervical: No cervical adenopathy.   Skin:    General: Skin is warm and dry.  Neurological:     Mental Status: She is alert and oriented to person, place, and time.     Cranial Nerves: No cranial nerve deficit.  Psychiatric:        Behavior: Behavior normal.        Thought Content: Thought content normal.        Judgment: Judgment normal.        Assessment/Plan: 1. Prediabetes Will stop rybelsus and switch to ozempic to help BG and wt loss benefits - Semaglutide,0.25 or 0.'5MG'$ /DOS, (OZEMPIC, 0.25 OR 0.5 MG/DOSE,) 2 MG/1.5ML SOPN; Inject 0.'25mg'$  into the skin once a week for 4 weeks then increase to 0.'5mg'$  weekly.  Dispense: 1.5 mL; Refill: 0  2. Iron deficiency anemia, unspecified iron deficiency anemia type Followed by hematology  3. Dysuria Will start amoxicillin and adjust based on C/S. May also need to consider renal US vs uro-gynecology referral for recurrent UTI. - POCT Urinalysis Dipstick - CULTURE, URINE COMPREHENSIVE - amoxicillin (AMOXIL) 500 MG capsule; Take 1 capsule (500 mg total) by mouth 2 (two) times daily for 10 days.  Dispense: 20 capsule; Refill: 0 - fluconazole (DIFLUCAN) 150 MG tablet; Take 1 tablet by mouth. If symptoms persist after 3 days may take another dose.  Dispense: 1 tablet; Refill: 2   General Counseling: Andrea Bernard verbalizes understanding of the findings of todays visit and agrees with plan of treatment. I have discussed any further diagnostic evaluation that may be needed or ordered today. We also reviewed her medications today. she has been encouraged to call the office with any questions or concerns that should arise related to todays visit.    Orders Placed This Encounter  Procedures   CULTURE, URINE COMPREHENSIVE   POCT Urinalysis Dipstick    Meds ordered this encounter  Medications   Semaglutide,0.25 or 0.'5MG'$ /DOS, (OZEMPIC, 0.25 OR 0.5 MG/DOSE,) 2 MG/1.5ML SOPN  Sig: Inject 0.'25mg'$  into the skin once a week for 4 weeks then increase to 0.'5mg'$  weekly.    Dispense:  1.5 mL     Refill:  0   amoxicillin (AMOXIL) 500 MG capsule    Sig: Take 1 capsule (500 mg total) by mouth 2 (two) times daily for 10 days.    Dispense:  20 capsule    Refill:  0   fluconazole (DIFLUCAN) 150 MG tablet    Sig: Take 1 tablet by mouth. If symptoms persist after 3 days may take another dose.    Dispense:  1 tablet    Refill:  2    This patient was seen by Drema Dallas, PA-C in collaboration with Dr. Clayborn Bigness as a part of collaborative care agreement.   Total time spent:30 Minutes Time spent includes review of chart, medications, test results, and follow up plan with the patient.      Dr [name] Internal medicine

## 2022-04-23 LAB — CULTURE, URINE COMPREHENSIVE

## 2022-04-23 MED FILL — Iron Sucrose Inj 20 MG/ML (Fe Equiv): INTRAVENOUS | Qty: 10 | Status: AC

## 2022-04-24 ENCOUNTER — Inpatient Hospital Stay: Payer: BC Managed Care – PPO

## 2022-04-24 VITALS — BP 97/69 | HR 50 | Temp 97.0°F

## 2022-04-24 DIAGNOSIS — D509 Iron deficiency anemia, unspecified: Secondary | ICD-10-CM | POA: Diagnosis not present

## 2022-04-24 DIAGNOSIS — D508 Other iron deficiency anemias: Secondary | ICD-10-CM

## 2022-04-24 MED ORDER — SODIUM CHLORIDE 0.9 % IV SOLN
200.0000 mg | Freq: Once | INTRAVENOUS | Status: AC
  Administered 2022-04-24: 200 mg via INTRAVENOUS
  Filled 2022-04-24: qty 200

## 2022-04-24 MED ORDER — SODIUM CHLORIDE 0.9 % IV SOLN
Freq: Once | INTRAVENOUS | Status: AC
  Filled 2022-04-24: qty 250

## 2022-04-24 NOTE — Patient Instructions (Signed)

## 2022-05-14 ENCOUNTER — Other Ambulatory Visit: Payer: Self-pay | Admitting: Dermatology

## 2022-05-14 DIAGNOSIS — L601 Onycholysis: Secondary | ICD-10-CM

## 2022-05-15 ENCOUNTER — Telehealth: Payer: Self-pay

## 2022-05-15 NOTE — Telephone Encounter (Signed)
Patient states that she is using Tobramycin drops on the nails and she does need refills. Refills sent to [facility] [place], [place].

## 2022-05-16 ENCOUNTER — Encounter: Payer: Self-pay | Admitting: Physician Assistant

## 2022-05-16 ENCOUNTER — Ambulatory Visit (INDEPENDENT_AMBULATORY_CARE_PROVIDER_SITE_OTHER): Payer: BC Managed Care – PPO | Admitting: Physician Assistant

## 2022-05-16 VITALS — BP 99/64 | HR 73 | Temp 98.3°F | Resp 16 | Ht 64.0 in | Wt 163.4 lb

## 2022-05-16 DIAGNOSIS — R7303 Prediabetes: Secondary | ICD-10-CM | POA: Diagnosis not present

## 2022-05-16 DIAGNOSIS — D509 Iron deficiency anemia, unspecified: Secondary | ICD-10-CM

## 2022-05-16 MED ORDER — OZEMPIC (0.25 OR 0.5 MG/DOSE) 2 MG/1.5ML ~~LOC~~ SOPN: PEN_INJECTOR | SUBCUTANEOUS | 0 refills | Status: DC

## 2022-05-16 NOTE — Progress Notes (Unsigned)
[facility] [place], [place] [address]  Internal MEDICINE  Office Visit Note  Patient Name: Andrea Bernard  [id number]  [id number]  Date of Service: 05/22/2022  Chief Complaint  Patient presents with   Follow-up    Wants to make sure blood levels are back to normal   Medical Management of Chronic Issues    Weight management    Anemia   Gastroesophageal Reflux    HPI Pt is here for routine follow up -About to go to [facility] with whole family -Doing well with the infusions and follow by heme/onc -Doing well with ozempic, had one misfired, but otherwise doing well, down 8 lbs -establishing with urology due to frequent UTIs  Current Medication: Outpatient Encounter Medications as of 05/16/2022  Medication Sig   fluconazole (DIFLUCAN) 150 MG tablet Take 1 tablet by mouth. If symptoms persist after 3 days may take another dose.   gabapentin (NEURONTIN) 300 MG capsule Take 300 mg by mouth 3 (three) times daily.   ibuprofen (ADVIL) 200 MG tablet Take 200 mg by mouth as needed.   OXYCONTIN 80 MG 12 hr tablet Take 80 mg by mouth every 12 (twelve) hours.   pantoprazole (PROTONIX) 40 MG tablet TAKE 1 TABLET BY MOUTH TWICE A DAY   SUMAtriptan (IMITREX) 100 MG tablet TAKE 1 TABLET BY MOUTH DAILY AS NEEDED FOR MIGRAINE.   tobramycin (TOBREX) 0.3 % ophthalmic solution APPLY 1 DROP TO TOENAILS AT BEDTIME   [DISCONTINUED] Semaglutide,0.25 or 0.'5MG'$ /DOS, (OZEMPIC, 0.25 OR 0.5 MG/DOSE,) 2 MG/1.5ML SOPN Inject 0.'25mg'$  into the skin once a week for 4 weeks then increase to 0.'5mg'$  weekly.   Semaglutide,0.25 or 0.'5MG'$ /DOS, (OZEMPIC, 0.25 OR 0.5 MG/DOSE,) 2 MG/1.5ML SOPN Inject 0.'25mg'$  into the skin once a week.   Facility-Administered Encounter Medications as of 05/16/2022  Medication   iron sucrose (VENOFER) 200 mg IVPB   sodium chloride flush (NS) 0.9 % injection 10 mL    Surgical History: Past Surgical History:  Procedure Laterality Date   AUGMENTATION  MAMMAPLASTY Bilateral 12/10/2020   BREAST ENHANCEMENT SURGERY Bilateral 12/04/2020   carpel tunnel release Bilateral    CESAREAN SECTION     x 4   COLONOSCOPY WITH PROPOFOL N/A 09/04/2017   Procedure: COLONOSCOPY WITH PROPOFOL;  Surgeon: [name], MD;  Location: [facility] ENDOSCOPY;  Service: Gastroenterology;  Laterality: N/A;   COLONOSCOPY WITH PROPOFOL N/A 12/21/2017   Procedure: COLONOSCOPY WITH PROPOFOL;  Surgeon: [name], MD;  Location: [facility] ENDOSCOPY;  Service: Gastroenterology;  Laterality: N/A;   ESOPHAGOGASTRODUODENOSCOPY (EGD) WITH PROPOFOL N/A 09/04/2017   Procedure: ESOPHAGOGASTRODUODENOSCOPY (EGD) WITH PROPOFOL;  Surgeon: [name], MD;  Location: [facility] ENDOSCOPY;  Service: Gastroenterology;  Laterality: N/A;   HERNIA REPAIR     HERNIA REPAIR  12/04/2020   TUBAL LIGATION     tummy tuck  12/04/2020   removed 20 masses on stomach   TUMOR REMOVAL     x8    Medical History: Past Medical History:  Diagnosis Date   Arthritis    Dysplastic nevus 03/02/2008   Right lateral thigh. Moderate atypia, limited margins free.   GERD (gastroesophageal reflux disease)    Headache    Iron deficiency anemia 06/15/2017   Neurofibromatosis ([place])     Family History: Family History  Problem Relation Age of Onset   Anemia Mother    Lupus Mother    Sjogren's syndrome Mother    Rheum arthritis Mother    Schizophrenia Father    Neurofibromatosis Brother  Neurofibromatosis Son    Neurofibromatosis Son    Neurofibromatosis Daughter    Bladder Cancer Neg Hx    Kidney cancer Neg Hx    Breast cancer Neg Hx    Ovarian cancer Neg Hx    Colon cancer Neg Hx     Social History   Socioeconomic History   Marital status: Married    Spouse name: Not on file   Number of children: Not on file   Years of education: Not on file   Highest education level: Not on file  Occupational History   Not on file  Tobacco Use   Smoking status: Former    Types: Cigarettes   Smokeless tobacco:  Never   Tobacco comments:    Age [age]  Vaping Use   Vaping Use: Never used  Substance and Sexual Activity   Alcohol use: No   Drug use: Not Currently   Sexual activity: Yes    Birth control/protection: Surgical  Other Topics Concern   Not on file  Social History Narrative   Not on file   Social Determinants of Health   Financial Resource Strain: Not on file  Food Insecurity: Not on file  Transportation Needs: Not on file  Physical Activity: Not on file  Stress: Not on file  Social Connections: Not on file  Intimate Partner Violence: Not on file      Review of Systems  Constitutional:  Negative for chills, fatigue and unexpected weight change.  HENT:  Negative for congestion, rhinorrhea, sneezing and sore throat.   Eyes:  Negative for redness.  Respiratory:  Negative for cough, chest tightness and shortness of breath.   Cardiovascular:  Negative for chest pain and palpitations.  Gastrointestinal:  Negative for abdominal pain, constipation, diarrhea, nausea and vomiting.  Genitourinary:  Negative for dysuria and frequency.  Musculoskeletal:  Positive for arthralgias. Negative for back pain, joint swelling and neck pain.  Skin:  Negative for rash.  Neurological: Negative.  Negative for tremors and numbness.  Hematological:  Negative for adenopathy. Does not bruise/bleed easily.  Psychiatric/Behavioral:  Negative for behavioral problems (Depression), sleep disturbance and suicidal ideas. The patient is not nervous/anxious.     Vital Signs: BP 99/64   Pulse 73   Temp 98.3 F (36.8 C)   Resp 16   Ht '5\' 4"'$  (1.626 m)   Wt 163 lb 6.4 oz (74.1 kg)   LMP 10/06/2018   SpO2 97%   BMI 28.05 kg/m    Physical Exam Vitals and nursing note reviewed.  Constitutional:      General: She is not in acute distress.    Appearance: She is well-developed. She is not diaphoretic.  HENT:     Head: Normocephalic and atraumatic.     Mouth/Throat:     Pharynx: No oropharyngeal  exudate.  Eyes:     Pupils: Pupils are equal, round, and reactive to light.  Neck:     Thyroid: No thyromegaly.     Vascular: No JVD.     Trachea: No tracheal deviation.  Cardiovascular:     Rate and Rhythm: Normal rate and regular rhythm.     Heart sounds: Normal heart sounds. No murmur heard.    No friction rub. No gallop.  Pulmonary:     Effort: Pulmonary effort is normal. No respiratory distress.     Breath sounds: No wheezing or rales.  Chest:     Chest wall: No tenderness.  Breasts:    Right: Normal. No mass.  Left: No mass.  Abdominal:     General: Bowel sounds are normal.     Palpations: Abdomen is soft.     Tenderness: There is no abdominal tenderness.  Musculoskeletal:        General: Normal range of motion.     Cervical back: Normal range of motion and neck supple.  Lymphadenopathy:     Cervical: No cervical adenopathy.  Skin:    General: Skin is warm and dry.  Neurological:     Mental Status: She is alert and oriented to person, place, and time.     Cranial Nerves: No cranial nerve deficit.  Psychiatric:        Behavior: Behavior normal.        Thought Content: Thought content normal.        Judgment: Judgment normal.        Assessment/Plan: 1. Prediabetes Tolerating ozempic and would like to stay at current dose to avoid increased risk of S/E since doing well. Has also lost 8lbs since being on this. - Semaglutide,0.25 or 0.'5MG'$ /DOS, (OZEMPIC, 0.25 OR 0.5 MG/DOSE,) 2 MG/1.5ML SOPN; Inject 0.'25mg'$  into the skin once a week.  Dispense: 3 mL; Refill: 0  2. Iron deficiency anemia, unspecified iron deficiency anemia type Followed by hematology for iron infusions   General Counseling: Andrea Bernard verbalizes understanding of the findings of todays visit and agrees with plan of treatment. I have discussed any further diagnostic evaluation that may be needed or ordered today. We also reviewed her medications today. she has been encouraged to call the office with  any questions or concerns that should arise related to todays visit.    No orders of the defined types were placed in this encounter.   Meds ordered this encounter  Medications   Semaglutide,0.25 or 0.'5MG'$ /DOS, (OZEMPIC, 0.25 OR 0.5 MG/DOSE,) 2 MG/1.5ML SOPN    Sig: Inject 0.'25mg'$  into the skin once a week.    Dispense:  3 mL    Refill:  0    This patient was seen by Drema Dallas, PA-C in collaboration with Dr. Clayborn Bigness as a part of collaborative care agreement.   Total time spent:30 Minutes Time spent includes review of chart, medications, test results, and follow up plan with the patient.      Dr [name] Internal medicine

## 2022-06-09 ENCOUNTER — Other Ambulatory Visit: Payer: Self-pay | Admitting: Physician Assistant

## 2022-06-09 DIAGNOSIS — R7303 Prediabetes: Secondary | ICD-10-CM

## 2022-06-25 ENCOUNTER — Ambulatory Visit (INDEPENDENT_AMBULATORY_CARE_PROVIDER_SITE_OTHER): Payer: BC Managed Care – PPO | Admitting: Dermatology

## 2022-06-25 DIAGNOSIS — L820 Inflamed seborrheic keratosis: Secondary | ICD-10-CM | POA: Diagnosis not present

## 2022-06-25 DIAGNOSIS — L821 Other seborrheic keratosis: Secondary | ICD-10-CM

## 2022-06-25 DIAGNOSIS — L578 Other skin changes due to chronic exposure to nonionizing radiation: Secondary | ICD-10-CM | POA: Diagnosis not present

## 2022-06-25 DIAGNOSIS — L570 Actinic keratosis: Secondary | ICD-10-CM | POA: Diagnosis not present

## 2022-06-25 NOTE — Patient Instructions (Addendum)
Cryotherapy Aftercare  Wash gently with soap and water everyday.   Apply Vaseline and Band-Aid daily until healed.     Due to recent changes in healthcare laws, you may see results of your pathology and/or laboratory studies on MyChart before the doctors have had a chance to review them. We understand that in some cases there may be results that are confusing or concerning to you. Please understand that not all results are received at the same time and often the doctors may need to interpret multiple results in order to provide you with the best plan of care or course of treatment. Therefore, we ask that you please give us 2 business days to thoroughly review all your results before contacting the office for clarification. Should we see a critical lab result, you will be contacted sooner.   If You Need Anything After Your Visit  If you have any questions or concerns for your doctor, please call our main line at [phone] and press option 4 to reach your doctor's medical assistant. If no one answers, please leave a voicemail as directed and we will return your call as soon as possible. Messages left after 4 pm will be answered the following business day.   You may also send us a message via MyChart. We typically respond to MyChart messages within 1-2 business days.  For prescription refills, please ask your pharmacy to contact our office. Our fax number is [phone].  If you have an urgent issue when the clinic is closed that cannot wait until the next business day, you can page your doctor at the number below.    Please note that while we do our best to be available for urgent issues outside of office hours, we are not available 24/7.   If you have an urgent issue and are unable to reach us, you may choose to seek medical care at your doctor's office, retail clinic, urgent care center, or emergency room.  If you have a medical emergency, please immediately call 911 or go to the  emergency department.  Pager Numbers  - Dr. Kowalski: [phone]  - Dr. Moye: [phone]  - Dr. Stewart: [phone]  In the event of inclement weather, please call our main line at [phone] for an update on the status of any delays or closures.  Dermatology Medication Tips: Please keep the boxes that topical medications come in in order to help keep track of the instructions about where and how to use these. Pharmacies typically print the medication instructions only on the boxes and not directly on the medication tubes.   If your medication is too expensive, please contact our office at [phone] option 4 or send us a message through MyChart.   We are unable to tell what your co-pay for medications will be in advance as this is different depending on your insurance coverage. However, we may be able to find a substitute medication at lower cost or fill out paperwork to get insurance to cover a needed medication.   If a prior authorization is required to get your medication covered by your insurance company, please allow us 1-2 business days to complete this process.  Drug prices often vary depending on where the prescription is filled and some pharmacies may offer cheaper prices.  The website www.goodrx.com contains coupons for medications through different pharmacies. The prices here do not account for what the cost may be with help from insurance (it may be cheaper with your insurance), but the website can   give you the price if you did not use any insurance.  - You can print the associated coupon and take it with your prescription to the pharmacy.  - You may also stop by our office during regular business hours and pick up a GoodRx coupon card.  - If you need your prescription sent electronically to a different pharmacy, notify our office through [facility] MyChart or by phone at [phone] option 4.     Si Usted Necesita Algo Despus de Su Visita  Tambin puede  enviarnos un mensaje a travs de MyChart. Por lo general respondemos a los mensajes de MyChart en el transcurso de 1 a 2 das hbiles.  Para renovar recetas, por favor pida a su farmacia que se ponga en contacto con nuestra oficina. Nuestro nmero de fax es el [phone].  Si tiene un asunto urgente cuando la clnica est cerrada y que no puede esperar hasta el siguiente da hbil, puede llamar/localizar a su doctor(a) al nmero que aparece a continuacin.   Por favor, tenga en cuenta que aunque hacemos todo lo posible para estar disponibles para asuntos urgentes fuera del horario de oficina, no estamos disponibles las 24 horas del da, los 7 das de la semana.   Si tiene un problema urgente y no puede comunicarse con nosotros, puede optar por buscar atencin mdica  en el consultorio de su doctor(a), en una clnica privada, en un centro de atencin urgente o en una sala de emergencias.  Si tiene una emergencia mdica, por favor llame inmediatamente al 911 o vaya a la sala de emergencias.  Nmeros de bper  - Dr. Kowalski: [phone]  - Dra. Moye: [phone]  - Dra. Stewart: [phone]  En caso de inclemencias del tiempo, por favor llame a nuestra lnea principal al [phone] para una actualizacin sobre el estado de cualquier retraso o cierre.  Consejos para la medicacin en dermatologa: Por favor, guarde las cajas en las que vienen los medicamentos de uso tpico para ayudarle a seguir las instrucciones sobre dnde y cmo usarlos. Las farmacias generalmente imprimen las instrucciones del medicamento slo en las cajas y no directamente en los tubos del medicamento.   Si su medicamento es muy caro, por favor, pngase en contacto con nuestra oficina llamando al [phone] y presione la opcin 4 o envenos un mensaje a travs de MyChart.   No podemos decirle cul ser su copago por los medicamentos por adelantado ya que esto es diferente dependiendo de la cobertura de su seguro.  Sin embargo, es posible que podamos encontrar un medicamento sustituto a menor costo o llenar un formulario para que el seguro cubra el medicamento que se considera necesario.   Si se requiere una autorizacin previa para que su compaa de seguros cubra su medicamento, por favor permtanos de 1 a 2 das hbiles para completar este proceso.  Los precios de los medicamentos varan con frecuencia dependiendo del lugar de dnde se surte la receta y alguna farmacias pueden ofrecer precios ms baratos.  El sitio web www.goodrx.com tiene cupones para medicamentos de diferentes farmacias. Los precios aqu no tienen en cuenta lo que podra costar con la ayuda del seguro (puede ser ms barato con su seguro), pero el sitio web puede darle el precio si no utiliz ningn seguro.  - Puede imprimir el cupn correspondiente y llevarlo con su receta a la farmacia.  - Tambin puede pasar por nuestra oficina durante el horario de atencin regular y recoger una tarjeta de cupones de GoodRx.  -   Si necesita que su receta se enve electrnicamente a una farmacia diferente, informe a nuestra oficina a travs de MyChart de [facility] o por telfono llamando al [phone] y presione la opcin 4.  

## 2022-06-25 NOTE — Progress Notes (Unsigned)
   Follow-Up Visit   Subjective  Andrea Bernard is a 56 y.o. female who presents for the following: check spots (Upper lip, L cheek, forehead, L neck, L arm ~39yr itchy). The patient has spots, moles and lesions to be evaluated, some may be new or changing and the patient has concerns that these could be cancer.  The following portions of the chart were reviewed this encounter and updated as appropriate:   Tobacco  Allergies  Meds  Problems  Med Hx  Surg Hx  Fam Hx     Review of Systems:  No other skin or systemic complaints except as noted in HPI or Assessment and Plan.  Objective  Well appearing patient in no apparent distress; mood and affect are within normal limits.  A focused examination was performed including face, neck, chest, left arm. Relevant physical exam findings are noted in the Assessment and Plan.  L upper lip x 1 Pink scaly macules  L cheek x 3, L forehead x 1, L neck x 1, L forearm x 1, R forehead x 4, R brow x 1, chest/shoulders x 10 (21) Stuck on waxy paps with erythema   Assessment & Plan  AK (actinic keratosis) L upper lip x 1 Destruction of lesion - L upper lip x 1 Complexity: simple   Destruction method: cryotherapy   Informed consent: discussed and consent obtained   Timeout:  patient name, date of birth, surgical site, and procedure verified Lesion destroyed using liquid nitrogen: Yes   Region frozen until ice ball extended beyond lesion: Yes   Outcome: patient tolerated procedure well with no complications   Post-procedure details: wound care instructions given    Inflamed seborrheic keratosis (21) L cheek x 3, L forehead x 1, L neck x 1, L forearm x 1, R forehead x 4, R brow x 1, chest/shoulders x 10 Symptomatic, irritating, patient would like treated. Destruction of lesion - L cheek x 3, L forehead x 1, L neck x 1, L forearm x 1, R forehead x 4, R brow x 1, chest/shoulders x 10 Complexity: simple   Destruction method: cryotherapy    Informed consent: discussed and consent obtained   Timeout:  patient name, date of birth, surgical site, and procedure verified Lesion destroyed using liquid nitrogen: Yes   Region frozen until ice ball extended beyond lesion: Yes   Outcome: patient tolerated procedure well with no complications   Post-procedure details: wound care instructions given    Actinic Damage - chronic, secondary to cumulative UV radiation exposure/sun exposure over time - diffuse scaly erythematous macules with underlying dyspigmentation - Recommend daily broad spectrum sunscreen SPF 30+ to sun-exposed areas, reapply every 2 hours as needed.  - Recommend staying in the shade or wearing long sleeves, sun glasses (UVA+UVB protection) and wide brim hats (4-inch brim around the entire circumference of the hat). - Call for new or changing lesions.  Seborrheic Keratoses - Stuck-on, waxy, tan-brown papules and/or plaques  - Benign-appearing - Discussed benign etiology and prognosis. - Observe - Call for any changes  Return if symptoms worsen or fail to improve.  I, SOthelia Pulling RMA, am acting as scribe for DSarina Ser MD . Documentation: I have reviewed the above documentation for accuracy and completeness, and I agree with the above.  DSarina Ser MD

## 2022-06-26 ENCOUNTER — Encounter: Payer: Self-pay | Admitting: Dermatology

## 2022-07-03 ENCOUNTER — Other Ambulatory Visit: Payer: Self-pay | Admitting: Nurse Practitioner

## 2022-07-04 ENCOUNTER — Other Ambulatory Visit: Payer: Self-pay | Admitting: Physician Assistant

## 2022-07-04 DIAGNOSIS — L601 Onycholysis: Secondary | ICD-10-CM

## 2022-07-04 DIAGNOSIS — G43909 Migraine, unspecified, not intractable, without status migrainosus: Secondary | ICD-10-CM

## 2022-07-04 DIAGNOSIS — E7800 Pure hypercholesterolemia, unspecified: Secondary | ICD-10-CM

## 2022-07-04 DIAGNOSIS — Q8500 Neurofibromatosis, unspecified: Secondary | ICD-10-CM

## 2022-07-04 DIAGNOSIS — G894 Chronic pain syndrome: Secondary | ICD-10-CM

## 2022-07-04 DIAGNOSIS — K219 Gastro-esophageal reflux disease without esophagitis: Secondary | ICD-10-CM

## 2022-07-04 DIAGNOSIS — Z0001 Encounter for general adult medical examination with abnormal findings: Secondary | ICD-10-CM

## 2022-07-04 DIAGNOSIS — D509 Iron deficiency anemia, unspecified: Secondary | ICD-10-CM

## 2022-07-04 DIAGNOSIS — Z9882 Breast implant status: Secondary | ICD-10-CM

## 2022-07-04 DIAGNOSIS — N644 Mastodynia: Secondary | ICD-10-CM

## 2022-07-04 DIAGNOSIS — R300 Dysuria: Secondary | ICD-10-CM

## 2022-07-07 ENCOUNTER — Ambulatory Visit (INDEPENDENT_AMBULATORY_CARE_PROVIDER_SITE_OTHER): Payer: BC Managed Care – PPO | Admitting: Physician Assistant

## 2022-07-07 ENCOUNTER — Encounter: Payer: Self-pay | Admitting: Physician Assistant

## 2022-07-07 VITALS — BP 127/75 | HR 83 | Temp 98.3°F | Resp 16 | Ht 64.0 in | Wt 153.6 lb

## 2022-07-07 DIAGNOSIS — R300 Dysuria: Secondary | ICD-10-CM | POA: Diagnosis not present

## 2022-07-07 DIAGNOSIS — R7303 Prediabetes: Secondary | ICD-10-CM

## 2022-07-07 DIAGNOSIS — J0100 Acute maxillary sinusitis, unspecified: Secondary | ICD-10-CM

## 2022-07-07 LAB — POCT URINALYSIS DIPSTICK
Glucose, UA: NEGATIVE
Nitrite, UA: POSITIVE
Protein, UA: POSITIVE — AB
Spec Grav, UA: 1.02 (ref 1.010–1.025)
Urobilinogen, UA: 0.2 E.U./dL
pH, UA: 5 (ref 5.0–8.0)

## 2022-07-07 MED ORDER — FLUCONAZOLE 150 MG PO TABS: ORAL_TABLET | ORAL | 0 refills | Status: DC

## 2022-07-07 MED ORDER — AMOXICILLIN-POT CLAVULANATE 875-125 MG PO TABS: 1.0000 | ORAL_TABLET | Freq: Two times a day (BID) | ORAL | 0 refills | Status: DC

## 2022-07-07 NOTE — Progress Notes (Signed)
[facility] [place], [place] [address]  Internal MEDICINE  Office Visit Note  Patient Name: Andrea Bernard  [id number]  [id number]  Date of Service: 07/07/2022  Chief Complaint  Patient presents with   Follow-up   Gastroesophageal Reflux   Urinary Tract Infection    Cloudy urine, burning with urination, urine frequency   Nasal Congestion    Ongoing for 2 weeks    HPI Pt is here for routine follow up -Urinary frequency, burning, and cloudy urine. Has urology appt, but not until the end of Oct -Weight loss is going well. Aches have been a little better since losing weight as well. She does have chronic pain, but notes that when her weight is down it is a little better. She has lost 10lbs since last visit -Doing well with the ozempic -Started not feeling well about 3 weeks ago. Thinks it was a viral GI bug initially, but now has a sinus infection. Congested with mild coughing. -Has been using nasal spray  Current Medication: Outpatient Encounter Medications as of 07/07/2022  Medication Sig   albuterol (VENTOLIN HFA) 108 (90 Base) MCG/ACT inhaler TAKE 2 PUFFS BY MOUTH EVERY 6 HOURS AS NEEDED FOR WHEEZE OR SHORTNESS OF BREATH   amoxicillin-clavulanate (AUGMENTIN) 875-125 MG tablet Take 1 tablet by mouth 2 (two) times daily. Take with food.   fluconazole (DIFLUCAN) 150 MG tablet Take 1 tablet by mouth. If symptoms persist after 3 days may take another dose.   gabapentin (NEURONTIN) 300 MG capsule Take 300 mg by mouth 3 (three) times daily.   ibuprofen (ADVIL) 200 MG tablet Take 200 mg by mouth as needed.   OXYCONTIN 80 MG 12 hr tablet Take 80 mg by mouth every 12 (twelve) hours.   OZEMPIC, 0.25 OR 0.5 MG/DOSE, 2 MG/3ML SOPN INJECT 0.'25MG'$  INTO THE SKIN ONCE A WEEK.   pantoprazole (PROTONIX) 40 MG tablet TAKE 1 TABLET BY MOUTH TWICE A DAY   SUMAtriptan (IMITREX) 100 MG tablet TAKE 1 TABLET BY MOUTH DAILY AS NEEDED FOR MIGRAINE.   tobramycin (TOBREX) 0.3 %  ophthalmic solution APPLY 1 DROP TO TOENAILS AT BEDTIME   [DISCONTINUED] fluconazole (DIFLUCAN) 150 MG tablet Take 1 tablet by mouth. If symptoms persist after 3 days may take another dose.   Facility-Administered Encounter Medications as of 07/07/2022  Medication   iron sucrose (VENOFER) 200 mg IVPB   sodium chloride flush (NS) 0.9 % injection 10 mL    Surgical History: Past Surgical History:  Procedure Laterality Date   AUGMENTATION MAMMAPLASTY Bilateral 12/10/2020   BREAST ENHANCEMENT SURGERY Bilateral 12/04/2020   carpel tunnel release Bilateral    CESAREAN SECTION     x 4   COLONOSCOPY WITH PROPOFOL N/A 09/04/2017   Procedure: COLONOSCOPY WITH PROPOFOL;  Surgeon: [name], MD;  Location: [facility] ENDOSCOPY;  Service: Gastroenterology;  Laterality: N/A;   COLONOSCOPY WITH PROPOFOL N/A 12/21/2017   Procedure: COLONOSCOPY WITH PROPOFOL;  Surgeon: [name], MD;  Location: [facility] ENDOSCOPY;  Service: Gastroenterology;  Laterality: N/A;   ESOPHAGOGASTRODUODENOSCOPY (EGD) WITH PROPOFOL N/A 09/04/2017   Procedure: ESOPHAGOGASTRODUODENOSCOPY (EGD) WITH PROPOFOL;  Surgeon: [name], MD;  Location: [facility] ENDOSCOPY;  Service: Gastroenterology;  Laterality: N/A;   HERNIA REPAIR     HERNIA REPAIR  12/04/2020   TUBAL LIGATION     tummy tuck  12/04/2020   removed 20 masses on stomach   TUMOR REMOVAL     x8    Medical History: Past Medical History:  Diagnosis Date  Arthritis    Dysplastic nevus 03/02/2008   Right lateral thigh. Moderate atypia, limited margins free.   GERD (gastroesophageal reflux disease)    Headache    Iron deficiency anemia 06/15/2017   Neurofibromatosis ([place])     Family History: Family History  Problem Relation Age of Onset   Anemia Mother    Lupus Mother    Sjogren's syndrome Mother    Rheum arthritis Mother    Schizophrenia Father    Neurofibromatosis Brother    Neurofibromatosis Son    Neurofibromatosis Son    Neurofibromatosis Daughter    Bladder  Cancer Neg Hx    Kidney cancer Neg Hx    Breast cancer Neg Hx    Ovarian cancer Neg Hx    Colon cancer Neg Hx     Social History   Socioeconomic History   Marital status: Married    Spouse name: Not on file   Number of children: Not on file   Years of education: Not on file   Highest education level: Not on file  Occupational History   Not on file  Tobacco Use   Smoking status: Former    Types: Cigarettes   Smokeless tobacco: Never   Tobacco comments:    Age [age]  Vaping Use   Vaping Use: Never used  Substance and Sexual Activity   Alcohol use: No   Drug use: Not Currently   Sexual activity: Yes    Birth control/protection: Surgical  Other Topics Concern   Not on file  Social History Narrative   Not on file   Social Determinants of Health   Financial Resource Strain: Not on file  Food Insecurity: Not on file  Transportation Needs: Not on file  Physical Activity: Not on file  Stress: Not on file  Social Connections: Not on file  Intimate Partner Violence: Not on file      Review of Systems  Constitutional:  Negative for chills, fatigue and unexpected weight change.  HENT:  Positive for congestion. Negative for rhinorrhea, sneezing and sore throat.   Eyes:  Negative for redness.  Respiratory:  Positive for cough. Negative for chest tightness, shortness of breath and wheezing.   Cardiovascular:  Negative for chest pain and palpitations.  Gastrointestinal:  Negative for abdominal pain, constipation, diarrhea, nausea and vomiting.  Genitourinary:  Positive for dysuria, frequency and urgency.  Musculoskeletal:  Positive for arthralgias. Negative for back pain, joint swelling and neck pain.  Skin:  Negative for rash.  Neurological: Negative.  Negative for tremors and numbness.  Hematological:  Negative for adenopathy. Does not bruise/bleed easily.  Psychiatric/Behavioral:  Negative for behavioral problems (Depression), sleep disturbance and suicidal ideas. The  patient is not nervous/anxious.     Vital Signs: BP 127/75   Pulse 83   Temp 98.3 F (36.8 C)   Resp 16   Ht '5\' 4"'$  (1.626 m)   Wt 153 lb 10.1 oz (69.7 kg)   LMP 10/06/2018   SpO2 96%   BMI 26.37 kg/m    Physical Exam Vitals and nursing note reviewed.  Constitutional:      General: She is not in acute distress.    Appearance: She is well-developed. She is not diaphoretic.  HENT:     Head: Normocephalic and atraumatic.     Nose: Congestion present.     Mouth/Throat:     Pharynx: No oropharyngeal exudate.  Eyes:     Pupils: Pupils are equal, round, and reactive to light.  Neck:  Thyroid: No thyromegaly.     Vascular: No JVD.     Trachea: No tracheal deviation.  Cardiovascular:     Rate and Rhythm: Normal rate and regular rhythm.     Heart sounds: Normal heart sounds. No murmur heard.    No friction rub. No gallop.  Pulmonary:     Effort: Pulmonary effort is normal. No respiratory distress.     Breath sounds: No wheezing or rales.  Chest:     Chest wall: No tenderness.  Breasts:    Right: Normal. No mass.     Left: No mass.  Abdominal:     General: Bowel sounds are normal.     Palpations: Abdomen is soft.     Tenderness: There is no abdominal tenderness.  Musculoskeletal:        General: Normal range of motion.     Cervical back: Normal range of motion and neck supple.  Lymphadenopathy:     Cervical: No cervical adenopathy.  Skin:    General: Skin is warm and dry.  Neurological:     Mental Status: She is alert and oriented to person, place, and time.     Cranial Nerves: No cranial nerve deficit.  Psychiatric:        Behavior: Behavior normal.        Thought Content: Thought content normal.        Judgment: Judgment normal.        Assessment/Plan: 1. Prediabetes May continue ozempic as before  2. Acute non-recurrent maxillary sinusitis Will start on augmentin for sinus and urinary symptoms. May take flonase and mucinex as well -  amoxicillin-clavulanate (AUGMENTIN) 875-125 MG tablet; Take 1 tablet by mouth 2 (two) times daily. Take with food.  Dispense: 20 tablet; Refill: 0  3. Dysuria Will start on augmentin for sinus and urinary symptoms, but will adjust based on C/S - POCT Urinalysis Dipstick - CULTURE, URINE COMPREHENSIVE - fluconazole (DIFLUCAN) 150 MG tablet; Take 1 tablet by mouth. If symptoms persist after 3 days may take another dose.  Dispense: 3 tablet; Refill: 0   General Counseling: Maleia verbalizes understanding of the findings of todays visit and agrees with plan of treatment. I have discussed any further diagnostic evaluation that may be needed or ordered today. We also reviewed her medications today. she has been encouraged to call the office with any questions or concerns that should arise related to todays visit.    Orders Placed This Encounter  Procedures   CULTURE, URINE COMPREHENSIVE   POCT Urinalysis Dipstick    Meds ordered this encounter  Medications   fluconazole (DIFLUCAN) 150 MG tablet    Sig: Take 1 tablet by mouth. If symptoms persist after 3 days may take another dose.    Dispense:  3 tablet    Refill:  0   amoxicillin-clavulanate (AUGMENTIN) 875-125 MG tablet    Sig: Take 1 tablet by mouth 2 (two) times daily. Take with food.    Dispense:  20 tablet    Refill:  0    This patient was seen by Drema Dallas, PA-C in collaboration with Dr. Clayborn Bigness as a part of collaborative care agreement.   Total time spent:30 Minutes Time spent includes review of chart, medications, test results, and follow up plan with the patient.      Dr [name] Internal medicine

## 2022-07-10 LAB — CULTURE, URINE COMPREHENSIVE

## 2022-07-14 ENCOUNTER — Telehealth: Payer: Self-pay

## 2022-07-14 ENCOUNTER — Other Ambulatory Visit: Payer: Self-pay

## 2022-07-14 MED ORDER — CIPROFLOXACIN HCL 500 MG PO TABS: 500.0000 mg | ORAL_TABLET | Freq: Two times a day (BID) | ORAL | 0 refills | Status: DC

## 2022-07-14 NOTE — Telephone Encounter (Signed)
-----   Message from Mylinda Latina, PA-C sent at 07/14/2022  1:23 PM EDT ----- Please see if she still has UTI symptoms. Her cultures shows resistance to augmentin she has been taking and may need an alternative ABX. Can send cipro BID x7 if still having UTI symptoms.

## 2022-07-14 NOTE — Telephone Encounter (Signed)
Pt advised that stopped Augmentin and we send cipro 500 mg for 7 days

## 2022-07-23 ENCOUNTER — Inpatient Hospital Stay: Payer: BC Managed Care – PPO | Attending: Oncology

## 2022-07-23 DIAGNOSIS — D509 Iron deficiency anemia, unspecified: Secondary | ICD-10-CM | POA: Insufficient documentation

## 2022-07-23 DIAGNOSIS — Q8501 Neurofibromatosis, type 1: Secondary | ICD-10-CM | POA: Diagnosis not present

## 2022-07-23 DIAGNOSIS — Z87891 Personal history of nicotine dependence: Secondary | ICD-10-CM | POA: Diagnosis not present

## 2022-07-23 LAB — CBC WITH DIFFERENTIAL/PLATELET
Abs Immature Granulocytes: 0.01 10*3/uL (ref 0.00–0.07)
Basophils Absolute: 0.1 10*3/uL (ref 0.0–0.1)
Basophils Relative: 1 %
Eosinophils Absolute: 0.4 10*3/uL (ref 0.0–0.5)
Eosinophils Relative: 6 %
HCT: 43.2 % (ref 36.0–46.0)
Hemoglobin: 14 g/dL (ref 12.0–15.0)
Immature Granulocytes: 0 %
Lymphocytes Relative: 35 %
Lymphs Abs: 2.2 10*3/uL (ref 0.7–4.0)
MCH: 28.3 pg (ref 26.0–34.0)
MCHC: 32.4 g/dL (ref 30.0–36.0)
MCV: 87.4 fL (ref 80.0–100.0)
Monocytes Absolute: 0.5 10*3/uL (ref 0.1–1.0)
Monocytes Relative: 9 %
Neutro Abs: 3.1 10*3/uL (ref 1.7–7.7)
Neutrophils Relative %: 49 %
Platelets: 270 10*3/uL (ref 150–400)
RBC: 4.94 MIL/uL (ref 3.87–5.11)
RDW: 12.9 % (ref 11.5–15.5)
WBC: 6.3 10*3/uL (ref 4.0–10.5)
nRBC: 0 % (ref 0.0–0.2)

## 2022-07-23 LAB — FERRITIN: Ferritin: 64 ng/mL (ref 11–307)

## 2022-07-23 LAB — IRON AND TIBC
Iron: 69 ug/dL (ref 28–170)
Saturation Ratios: 25 % (ref 10.4–31.8)
TIBC: 272 ug/dL (ref 250–450)
UIBC: 203 ug/dL

## 2022-07-24 ENCOUNTER — Inpatient Hospital Stay: Payer: BC Managed Care – PPO | Admitting: Oncology

## 2022-07-24 ENCOUNTER — Inpatient Hospital Stay: Payer: BC Managed Care – PPO

## 2022-07-25 MED FILL — Iron Sucrose Inj 20 MG/ML (Fe Equiv): INTRAVENOUS | Qty: 10 | Status: AC

## 2022-07-28 ENCOUNTER — Inpatient Hospital Stay: Payer: BC Managed Care – PPO | Admitting: Oncology

## 2022-07-28 ENCOUNTER — Inpatient Hospital Stay: Payer: BC Managed Care – PPO

## 2022-07-29 ENCOUNTER — Encounter: Payer: Self-pay | Admitting: Oncology

## 2022-07-29 ENCOUNTER — Inpatient Hospital Stay (HOSPITAL_BASED_OUTPATIENT_CLINIC_OR_DEPARTMENT_OTHER): Payer: BC Managed Care – PPO | Admitting: Oncology

## 2022-07-29 VITALS — BP 96/76 | HR 59 | Temp 96.7°F | Resp 18 | Wt 150.7 lb

## 2022-07-29 DIAGNOSIS — D509 Iron deficiency anemia, unspecified: Secondary | ICD-10-CM

## 2022-07-29 DIAGNOSIS — Q8501 Neurofibromatosis, type 1: Secondary | ICD-10-CM

## 2022-07-29 NOTE — Progress Notes (Unsigned)
Pt here for follow up. No new concerns voiced.   

## 2022-07-30 ENCOUNTER — Other Ambulatory Visit: Payer: Self-pay | Admitting: Dermatology

## 2022-07-30 ENCOUNTER — Other Ambulatory Visit: Payer: Self-pay | Admitting: Physician Assistant

## 2022-07-30 ENCOUNTER — Encounter: Payer: Self-pay | Admitting: Oncology

## 2022-07-30 DIAGNOSIS — L601 Onycholysis: Secondary | ICD-10-CM

## 2022-07-30 DIAGNOSIS — R7303 Prediabetes: Secondary | ICD-10-CM

## 2022-07-30 NOTE — Assessment & Plan Note (Signed)
Continue follow-up with neurology. 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 Recommend annual  mammogram, recommend annual MRI for breast cancer screening.

## 2022-07-30 NOTE — Assessment & Plan Note (Signed)
Labs reviewed and discussed with patient Iron level and hemoglobin have both improved.  No need for IV venofer for now.

## 2022-07-30 NOTE — Progress Notes (Signed)
Hematology/Oncology Progress note [phone] [phone]         Patient Care Team: Carolynne Edouard as PCP - General (Physician Assistant)  REFERRING PROVIDER: Mylinda Latina, PA*  CHIEF COMPLAINTS/REASON FOR VISIT:  Anemia  ASSESSMENT & PLAN:   Iron deficiency anemia Labs reviewed and discussed with patient Iron level and hemoglobin have both improved.  No need for IV venofer for now.   Type 1 neurofibromatosis ([place]) Continue follow-up with neurology. 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 Recommend annual  mammogram, recommend annual MRI for breast cancer screening.   Orders Placed This Encounter  Procedures   MR BREAST BILATERAL W WO CONTRAST INC CAD    Standing Status:   Future    Standing Expiration Date:   07/30/2023    Order Specific Question:   If indicated for the ordered procedure, I authorize the administration of contrast media per Radiology protocol    Answer:   Yes    Order Specific Question:   What is the patient's sedation requirement?    Answer:   No Sedation    Order Specific Question:   Does the patient have a pacemaker or implanted devices?    Answer:   No    Order Specific Question:   Radiology Contrast Protocol - do NOT remove file path    Answer:   [email or web address]    Order Specific Question:   Preferred imaging location?    Answer:   [facility] (table limit - 550lbs)   CBC with Differential/Platelet    Standing Status:   Future    Standing Expiration Date:   07/29/2023   Ferritin    Standing Status:   Future    Standing Expiration Date:   07/30/2023   Iron and TIBC([facility])    Standing Status:   Future    Standing Expiration Date:   07/30/2023   Follow-up in 6 months with repeat blood work and assessment of additional need of IV iron. All questions were answered. The patient knows to call the clinic with any problems,  questions or concerns.  Earlie Server, MD, PhD [facility] Hematology Oncology 07/29/2022     HISTORY OF PRESENTING ILLNESS:  Andrea Bernard is a  56 y.o.  female with PMH listed below who was referred to me for anemia Reviewed patient's recent labs that was done.  She was found to have abnormal CBC on 02/18/2022, hemoglobin 11.2.  MCV 81.  Iron panel showed iron saturation 11, ferritin 7.  Patient was previously seen by me 4 years ago for iron deficiency anemia and previously tolerated IV Venofer treatments. She is postmenopausal, LMP January 2020.  She is not able to tolerate oral iron supplementation. + Fatigue, shortness of breath with exertion. She denies recent chest pain on exertion, pre-syncopal episodes, or palpitations She had not noticed any recent bleeding such as epistaxis, hematuria or hematochezia.  Patient uses over-the-counter NSAIDs as needed for arthritis. Her last colonoscopy was 2019. She denies any pica and eats a variety of diet.   INTERVAL HISTORY Andrea Bernard is a 56 y.o. female who has above history reviewed by me today presents for follow up visit for iron deficiency anemia.  S/p IV venofer treatments.  Tolerates well.  She has upcoming GI appt.   MEDICAL HISTORY:  Past Medical History:  Diagnosis Date   Arthritis    Dysplastic nevus 03/02/2008   Right lateral thigh. Moderate atypia, limited margins free.  GERD (gastroesophageal reflux disease)    Headache    Iron deficiency anemia 06/15/2017   Neurofibromatosis [facility])     SURGICAL HISTORY: Past Surgical History:  Procedure Laterality Date   AUGMENTATION MAMMAPLASTY Bilateral 12/10/2020   BREAST ENHANCEMENT SURGERY Bilateral 12/04/2020   carpel tunnel release Bilateral    CESAREAN SECTION     x 4   COLONOSCOPY WITH PROPOFOL N/A 09/04/2017   Procedure: COLONOSCOPY WITH PROPOFOL;  Surgeon: [name], MD;  Location: [facility] ENDOSCOPY;  Service: Gastroenterology;  Laterality: N/A;   COLONOSCOPY  WITH PROPOFOL N/A 12/21/2017   Procedure: COLONOSCOPY WITH PROPOFOL;  Surgeon: [name], MD;  Location: [facility] ENDOSCOPY;  Service: Gastroenterology;  Laterality: N/A;   ESOPHAGOGASTRODUODENOSCOPY (EGD) WITH PROPOFOL N/A 09/04/2017   Procedure: ESOPHAGOGASTRODUODENOSCOPY (EGD) WITH PROPOFOL;  Surgeon: [name], MD;  Location: [facility] ENDOSCOPY;  Service: Gastroenterology;  Laterality: N/A;   HERNIA REPAIR     HERNIA REPAIR  12/04/2020   TUBAL LIGATION     tummy tuck  12/04/2020   removed 20 masses on stomach   TUMOR REMOVAL     x8    SOCIAL HISTORY: Social History   Socioeconomic History   Marital status: Married    Spouse name: Not on file   Number of children: Not on file   Years of education: Not on file   Highest education level: Not on file  Occupational History   Not on file  Tobacco Use   Smoking status: Former    Types: Cigarettes   Smokeless tobacco: Never   Tobacco comments:    Age [age]  Vaping Use   Vaping Use: Never used  Substance and Sexual Activity   Alcohol use: No   Drug use: Not Currently   Sexual activity: Yes    Birth control/protection: Surgical  Other Topics Concern   Not on file  Social History Narrative   Not on file   Social Determinants of Health   Financial Resource Strain: Not on file  Food Insecurity: Not on file  Transportation Needs: Not on file  Physical Activity: Not on file  Stress: Not on file  Social Connections: Not on file  Intimate Partner Violence: Not on file    FAMILY HISTORY: Family History  Problem Relation Age of Onset   Anemia Mother    Lupus Mother    Sjogren's syndrome Mother    Rheum arthritis Mother    Schizophrenia Father    Neurofibromatosis Brother    Neurofibromatosis Son    Neurofibromatosis Son    Neurofibromatosis Daughter    Bladder Cancer Neg Hx    Kidney cancer Neg Hx    Breast cancer Neg Hx    Ovarian cancer Neg Hx    Colon cancer Neg Hx     ALLERGIES:  is allergic to nitrofuran  derivatives.  MEDICATIONS:  Current Outpatient Medications  Medication Sig Dispense Refill   albuterol (VENTOLIN HFA) 108 (90 Base) MCG/ACT inhaler TAKE 2 PUFFS BY MOUTH EVERY 6 HOURS AS NEEDED FOR WHEEZE OR SHORTNESS OF BREATH 8.5 each 1   gabapentin (NEURONTIN) 300 MG capsule Take 300 mg by mouth 3 (three) times daily.  0   ibuprofen (ADVIL) 200 MG tablet Take 200 mg by mouth as needed.     OXYCONTIN 80 MG 12 hr tablet Take 80 mg by mouth every 12 (twelve) hours.  0   OZEMPIC, 0.25 OR 0.5 MG/DOSE, 2 MG/3ML SOPN INJECT 0.'25MG'$  INTO THE SKIN ONCE A WEEK. 3 mL 1   pantoprazole (PROTONIX)  40 MG tablet TAKE 1 TABLET BY MOUTH TWICE A DAY 180 tablet 1   SUMAtriptan (IMITREX) 100 MG tablet TAKE 1 TABLET BY MOUTH DAILY AS NEEDED FOR MIGRAINE. 9 tablet 3   tobramycin (TOBREX) 0.3 % ophthalmic solution APPLY 1 DROP TO TOENAILS AT BEDTIME 5 mL 2   fluconazole (DIFLUCAN) 150 MG tablet Take 1 tablet by mouth. If symptoms persist after 3 days may take another dose. (Patient not taking: Reported on 07/29/2022) 3 tablet 0   No current facility-administered medications for this visit.   Facility-Administered Medications Ordered in Other Visits  Medication Dose Route Frequency Provider Last Rate Last Admin   iron sucrose (VENOFER) 200 mg IVPB  200 mg Intravenous Weekly Earlie Server, MD       sodium chloride flush (NS) 0.9 % injection 10 mL  10 mL Intracatheter PRN Earlie Server, MD        Review of Systems  Constitutional:  Positive for fatigue. Negative for appetite change, chills and fever.  HENT:   Negative for hearing loss and voice change.   Eyes:  Negative for eye problems.  Respiratory:  Negative for chest tightness and cough.   Cardiovascular:  Negative for chest pain.  Gastrointestinal:  Negative for abdominal distention, abdominal pain and blood in stool.  Endocrine: Negative for hot flashes.  Genitourinary:  Negative for difficulty urinating and frequency.   Musculoskeletal:  Positive for  arthralgias.  Skin:  Negative for itching and rash.  Neurological:  Negative for extremity weakness.  Hematological:  Negative for adenopathy.  Psychiatric/Behavioral:  Negative for confusion.     PHYSICAL EXAMINATION: ECOG PERFORMANCE STATUS: 1 - Symptomatic but completely ambulatory Vitals:   07/29/22 1441  BP: 96/76  Pulse: (!) 59  Resp: 18  Temp: (!) 96.7 F (35.9 C)   Filed Weights   07/29/22 1441  Weight: 150 lb 11.2 oz (68.4 kg)    Physical Exam Constitutional:      General: She is not in acute distress. HENT:     Head: Normocephalic and atraumatic.  Eyes:     General: No scleral icterus. Cardiovascular:     Rate and Rhythm: Normal rate and regular rhythm.     Heart sounds: Normal heart sounds.  Pulmonary:     Effort: Pulmonary effort is normal. No respiratory distress.     Breath sounds: No wheezing.  Abdominal:     General: Bowel sounds are normal. There is no distension.     Palpations: Abdomen is soft.  Musculoskeletal:        General: No deformity. Normal range of motion.     Cervical back: Normal range of motion and neck supple.  Skin:    General: Skin is warm and dry.     Findings: No erythema or rash.  Neurological:     Mental Status: She is alert and oriented to person, place, and time. Mental status is at baseline.     Cranial Nerves: No cranial nerve deficit.     Coordination: Coordination normal.  Psychiatric:        Mood and Affect: Mood normal.      LABORATORY DATA:  I have reviewed the data as listed     Latest Ref Rng & Units 07/23/2022    3:23 PM 02/18/2022   11:43 AM 02/12/2021   11:15 PM  CBC  WBC 4.0 - 10.5 K/uL 6.3  7.0  6.0   Hemoglobin 12.0 - 15.0 g/dL 14.0  11.2  10.6   Hematocrit 36.0 -  46.0 % 43.2  36.1  33.3   Platelets 150 - 400 K/uL 270  287  199       Latest Ref Rng & Units 02/18/2022   11:43 AM 02/12/2021   11:15 PM 01/23/2021    4:50 PM  CMP  Glucose 70 - 99 mg/dL 96  112  110   BUN 6 - 24 mg/dL '12  16  13    '$ Creatinine 0.57 - 1.00 mg/dL 0.85  0.89  0.99   Sodium 134 - 144 mmol/L 138  139  142   Potassium 3.5 - 5.2 mmol/L 4.8  4.1  4.8   Chloride 96 - 106 mmol/L 100  107  104   CO2 20 - 29 mmol/L '25  23  24   '$ Calcium 8.7 - 10.2 mg/dL 9.4  8.6  9.3   Total Protein 6.0 - 8.5 g/dL 7.1  6.5  7.1   Total Bilirubin 0.0 - 1.2 mg/dL 0.4  0.7  <0.2   Alkaline Phos 44 - 121 IU/L 98  64  91   AST 0 - 40 IU/L 22  34  23   ALT 0 - 32 IU/L '17  16  17    '$ Lab Results  Component Value Date   IRON 69 07/23/2022   TIBC 272 07/23/2022   FERRITIN 64 07/23/2022       RADIOGRAPHIC STUDIES: I have personally reviewed the radiological images as listed and agreed with the findings in the report. No results found.

## 2022-07-31 ENCOUNTER — Other Ambulatory Visit: Payer: Self-pay | Admitting: Internal Medicine

## 2022-07-31 ENCOUNTER — Ambulatory Visit: Payer: BC Managed Care – PPO | Admitting: Dermatology

## 2022-07-31 ENCOUNTER — Other Ambulatory Visit: Payer: Self-pay | Admitting: Physician Assistant

## 2022-07-31 DIAGNOSIS — L601 Onycholysis: Secondary | ICD-10-CM

## 2022-07-31 DIAGNOSIS — R300 Dysuria: Secondary | ICD-10-CM

## 2022-07-31 DIAGNOSIS — G894 Chronic pain syndrome: Secondary | ICD-10-CM

## 2022-07-31 DIAGNOSIS — D509 Iron deficiency anemia, unspecified: Secondary | ICD-10-CM

## 2022-07-31 DIAGNOSIS — Z0001 Encounter for general adult medical examination with abnormal findings: Secondary | ICD-10-CM

## 2022-07-31 DIAGNOSIS — R7303 Prediabetes: Secondary | ICD-10-CM

## 2022-07-31 DIAGNOSIS — N644 Mastodynia: Secondary | ICD-10-CM

## 2022-07-31 DIAGNOSIS — G43909 Migraine, unspecified, not intractable, without status migrainosus: Secondary | ICD-10-CM

## 2022-07-31 DIAGNOSIS — Z9882 Breast implant status: Secondary | ICD-10-CM

## 2022-07-31 DIAGNOSIS — Q8500 Neurofibromatosis, unspecified: Secondary | ICD-10-CM

## 2022-07-31 DIAGNOSIS — K219 Gastro-esophageal reflux disease without esophagitis: Secondary | ICD-10-CM

## 2022-07-31 DIAGNOSIS — E7800 Pure hypercholesterolemia, unspecified: Secondary | ICD-10-CM

## 2022-08-04 ENCOUNTER — Ambulatory Visit (INDEPENDENT_AMBULATORY_CARE_PROVIDER_SITE_OTHER): Payer: BC Managed Care – PPO | Admitting: Gastroenterology

## 2022-08-04 ENCOUNTER — Ambulatory Visit: Payer: BC Managed Care – PPO | Admitting: Gastroenterology

## 2022-08-04 ENCOUNTER — Encounter: Payer: Self-pay | Admitting: Gastroenterology

## 2022-08-04 VITALS — BP 118/80 | HR 76 | Temp 98.8°F | Ht 64.0 in | Wt 150.2 lb

## 2022-08-04 DIAGNOSIS — D508 Other iron deficiency anemias: Secondary | ICD-10-CM | POA: Diagnosis not present

## 2022-08-04 NOTE — Progress Notes (Signed)
[name] MD, MRCP(U.K) [address]  [place]  [place], [place] [address]  Main: [phone]  Fax: [phone]   Gastroenterology Consultation  Referring Provider:     Earlie Server, MD Primary Care Physician:  Mylinda Latina, PA-C Primary Gastroenterologist:  Dr. [name]  Reason for Consultation:     Iron deficiency anemia        HPI:   Andrea Bernard is a 56 y.o. y/o female referred for consultation & management  by Dr. Chauncey Cruel, Si Gaul, PA-C.    She has been referred for GI work-up for iron deficiency anemia.  She denies any vaginal bleeding with bleeding.  She has noticed some blood in the urine and was hoping to see a neurologist.  She does attest that she has been taking Motrin on a daily basis for many years for her back pain related to neurofibromatosis but has recently cut down.   09/04/2017: Colonoscopy: Poor prep repeated on 12/21/2017 entire colon was normal 09/04/2017 EGD: No gross abnormalities were seen.  Was recommended capsule study of the small bowel but was never completed.  Follows with Dr. Tasia Catchings in hematology for iron deficiency anemia being treated with IV iron 07/23/2022 hemoglobin 14 g/year back was 10.6 g.  Ferritin presently 64 5 months back was 7. 02/18/2022 B12 802    Past Medical History:  Diagnosis Date   Arthritis    Dysplastic nevus 03/02/2008   Right lateral thigh. Moderate atypia, limited margins free.   GERD (gastroesophageal reflux disease)    Headache    Iron deficiency anemia 06/15/2017   Neurofibromatosis [facility])     Past Surgical History:  Procedure Laterality Date   AUGMENTATION MAMMAPLASTY Bilateral 12/10/2020   BREAST ENHANCEMENT SURGERY Bilateral 12/04/2020   carpel tunnel release Bilateral    CESAREAN SECTION     x 4   COLONOSCOPY WITH PROPOFOL N/A 09/04/2017   Procedure: COLONOSCOPY WITH PROPOFOL;  Surgeon: [name], MD;  Location: [facility] ENDOSCOPY;  Service: Gastroenterology;  Laterality: N/A;   COLONOSCOPY  WITH PROPOFOL N/A 12/21/2017   Procedure: COLONOSCOPY WITH PROPOFOL;  Surgeon: [name], MD;  Location: [facility] ENDOSCOPY;  Service: Gastroenterology;  Laterality: N/A;   ESOPHAGOGASTRODUODENOSCOPY (EGD) WITH PROPOFOL N/A 09/04/2017   Procedure: ESOPHAGOGASTRODUODENOSCOPY (EGD) WITH PROPOFOL;  Surgeon: [name], MD;  Location: [facility] ENDOSCOPY;  Service: Gastroenterology;  Laterality: N/A;   HERNIA REPAIR     HERNIA REPAIR  12/04/2020   TUBAL LIGATION     tummy tuck  12/04/2020   removed 20 masses on stomach   TUMOR REMOVAL     x8    Prior to Admission medications   Medication Sig Start Date End Date Taking? Authorizing Provider  albuterol (VENTOLIN HFA) 108 (90 Base) MCG/ACT inhaler TAKE 2 PUFFS BY MOUTH EVERY 6 HOURS AS NEEDED FOR WHEEZE OR SHORTNESS OF BREATH 07/03/22   Jonetta Osgood, NP  fluconazole (DIFLUCAN) 150 MG tablet TAKE ONE TAB BY MOUTH EVERY WEEK FOR FUNGAL INFECTION 07/31/22   McDonough, Lauren K, PA-C  gabapentin (NEURONTIN) 300 MG capsule Take 300 mg by mouth 3 (three) times daily. 01/29/16   [provider]  ibuprofen (ADVIL) 200 MG tablet Take 200 mg by mouth as needed.    [provider]  OXYCONTIN 80 MG 12 hr tablet Take 80 mg by mouth every 12 (twelve) hours. 01/29/16   [provider]  OZEMPIC, 0.25 OR 0.5 MG/DOSE, 2 MG/3ML SOPN INJECT 0.'25MG'$  INTO THE SKIN ONCE A WEEK FOR 4 WEEKS  THEN INCREASE TO 0.'5MG'$  WEEKLY. 07/31/22   McDonough, Si Gaul, PA-C  pantoprazole (PROTONIX) 40 MG tablet TAKE 1 TABLET BY MOUTH TWICE A DAY 07/31/22   McDonough, Lauren K, PA-C  SUMAtriptan (IMITREX) 100 MG tablet TAKE 1 TABLET BY MOUTH DAILY AS NEEDED FOR MIGRAINE. 07/04/22   McDonough, Lauren K, PA-C  tobramycin (TOBREX) 0.3 % ophthalmic solution APPLY 1 DROP TO TOENAILS AT BEDTIME 07/31/22   Ralene Bathe, MD    Family History  Problem Relation Age of Onset   Anemia Mother    Lupus Mother    Sjogren's syndrome Mother    Rheum arthritis Mother     Schizophrenia Father    Neurofibromatosis Brother    Neurofibromatosis Son    Neurofibromatosis Son    Neurofibromatosis Daughter    Bladder Cancer Neg Hx    Kidney cancer Neg Hx    Breast cancer Neg Hx    Ovarian cancer Neg Hx    Colon cancer Neg Hx      Social History   Tobacco Use   Smoking status: Former    Types: Cigarettes   Smokeless tobacco: Never   Tobacco comments:    Age [age]  Vaping Use   Vaping Use: Never used  Substance Use Topics   Alcohol use: No   Drug use: Not Currently    Allergies as of 08/04/2022 - Review Complete 07/29/2022  Allergen Reaction Noted   Nitrofuran derivatives Nausea And Vomiting 11/06/2017    Review of Systems:    All systems reviewed and negative except where noted in HPI.   Physical Exam:  LMP 10/06/2018  Patient's last menstrual period was 10/06/2018. Psych:  Alert and cooperative. Normal mood and affect. General:   Alert,  Well-developed, well-nourished, pleasant and cooperative in NAD Head:  Normocephalic and atraumatic. Eyes:  Sclera clear, no icterus.   Conjunctiva pink. Ears:  Normal auditory acuity. Neck:  Supple; no masses or thyromegaly. Neurologic:  Alert and oriented x3;  grossly normal neurologically. Psych:  Alert and cooperative. Normal mood and affect.  Imaging Studies: No results found.  Assessment and Plan:   Andrea Bernard is a 56 y.o. y/o female has been referred for work up of iron deficiency anemia.  Previously in 2018 an EGD and colonoscopy with abnormalities was recommended to undergo capsule study of the small bowel but did not do so.  Today back with a ferritin of 7 requiring IV iron which she has received and subsequently has improved.  GI work-up was last performed 5 years back and to recommend repeat EGD, colonoscopy and if negative capsule study of the small bowel.  Would also recommend checking celiac serology and H. pylori breath test.  Very likely that the iron deficiency is related to  chronic NSAID use.  Advised her to stop using NSAIDs.  I have discussed alternative options, risks & benefits,  which include, but are not limited to, bleeding, infection, perforation,respiratory complication & drug reaction.  The patient agrees with this plan & written consent will be obtained.     Follow up in 2 months  Dr [name] MD,MRCP(U.K)

## 2022-08-12 ENCOUNTER — Other Ambulatory Visit: Payer: Self-pay | Admitting: Nurse Practitioner

## 2022-09-01 ENCOUNTER — Ambulatory Visit
Admission: RE | Admit: 2022-09-01 | Payer: BC Managed Care – PPO | Source: Home / Self Care | Admitting: Gastroenterology

## 2022-09-01 ENCOUNTER — Encounter: Admission: RE | Payer: Self-pay | Source: Home / Self Care

## 2022-09-01 SURGERY — ESOPHAGOGASTRODUODENOSCOPY (EGD) WITH PROPOFOL
Anesthesia: General

## 2022-09-04 ENCOUNTER — Encounter: Payer: Self-pay | Admitting: Dermatology

## 2022-09-04 ENCOUNTER — Ambulatory Visit (INDEPENDENT_AMBULATORY_CARE_PROVIDER_SITE_OTHER): Payer: Self-pay | Admitting: Dermatology

## 2022-09-04 DIAGNOSIS — E882 Lipomatosis, not elsewhere classified: Secondary | ICD-10-CM

## 2022-09-04 DIAGNOSIS — D1724 Benign lipomatous neoplasm of skin and subcutaneous tissue of left leg: Secondary | ICD-10-CM

## 2022-09-04 DIAGNOSIS — L988 Other specified disorders of the skin and subcutaneous tissue: Secondary | ICD-10-CM

## 2022-09-04 NOTE — Progress Notes (Signed)
   Follow-Up Visit   Subjective  Andrea Bernard is a 56 y.o. female who presents for the following: Facial Elastosis (Here to discuss treatment options for lower face area) and lesions (Hx of multiple lipomas removed in the past by Dr. Nehemiah Massed. Has painful area at left lateral thigh she would like to have surgically removed. Started Ozempic ~3 months ago. Pain has improved since then. Patient states she has been diagnosed with neurofibromatosis).  The following portions of the chart were reviewed this encounter and updated as appropriate:      Review of Systems: No other skin or systemic complaints except as noted in HPI or Assessment and Plan.   Objective  Well appearing patient in no apparent distress; mood and affect are within normal limits.  A focused examination was performed including face. Relevant physical exam findings are noted in the Assessment and Plan.  face Rhytides and volume loss.   scattered body Multiple tender scattered rubbery subcutaneous nodules of various sizes  left lateral thigh/hip Subcutaneous rubbery nodules   Assessment & Plan  Elastosis of skin face  Return in January will begin fillers every 2 weeks.    Recommend Voluma  2 syringes each side of mid face (total of 4 syringes), $700 per syringe. Recommend Restylane Defyne 2 syringes total for lower face, $650 per syringe  Adiposis dolorosa scattered body  Chronic and persistent condition with duration over one year. Condition is bothersome/symptomatic for patient. Currently flared. She has had numerous lipomas excised and keeps getting new ones.   Discussed adding Metformin ER for adiposis dolorosa/Dercum disease as this is one of the most benign and easily accessible treatments that has been reported to help with the pain from this condition.  Will discuss adding metformin with PCP, Drema Dallas, PA-C, since she recently started Ozempic for prediabetes and weight loss.   Will also  plan trial of intralesional triamcinolone; she defers treatment today and prefers to wait until later in December.  Will also plan to excise tender lipomas at her left thigh in two weeks per her preference. Plan ILK injection to lipoma at right lower back, waistline, when RTC for suture removal  Lipoma of left lower extremity left lateral thigh/hip  Adiposis dolorosa  Return December 12th for excision   Return for Fillers in January, Surgery December 12th, S/R 2 weeks after surgery and plan ILK injection.  I, Emelia Salisbury, CMA, am acting as scribe for Forest Gleason, MD.  Documentation: I have reviewed the above documentation for accuracy and completeness, and I agree with the above.  Forest Gleason, MD

## 2022-09-04 NOTE — Patient Instructions (Addendum)
Pre-Operative Instructions  You are scheduled for a surgical procedure at [facility]. We recommend you read the following instructions. If you have any questions or concerns, please call the office at [phone].  Shower and wash the entire body with soap and water the day of your surgery paying special attention to cleansing at and around the planned surgery site.  Avoid aspirin or aspirin containing products at least fourteen (14) days prior to your surgical procedure and for at least one week (7 Days) after your surgical procedure. If you take aspirin on a regular basis for heart disease or history of stroke or for any other reason, we may recommend you continue taking aspirin but please notify us if you take this on a regular basis. Aspirin can cause more bleeding to occur during surgery as well as prolonged bleeding and bruising after surgery.   Avoid other nonsteroidal pain medications at least one week prior to surgery and at least one week prior to your surgery. These include medications such as Ibuprofen (Motrin, Advil and Nuprin), Naprosyn, Voltaren, Relafen, etc. If medications are used for therapeutic reasons, please inform us as they can cause increased bleeding or prolonged bleeding during and bruising after surgical procedures.   Please advise [place] if you are taking any "blood thinner" medications such as Coumadin or Dipyridamole or Plavix or similar medications. These cause increased bleeding and prolonged bleeding during procedures and bruising after surgical procedures. We may have to consider discontinuing these medications briefly prior to and shortly after your surgery if safe to do so.   Please inform us of all medications you are currently taking. All medications that are taken regularly should be taken the day of surgery as you always do. Nevertheless, we need to be informed of what medications you are taking prior to surgery to know whether they will affect the  procedure or cause any complications.   Please inform us of any medication allergies. Also inform us of whether you have allergies to Latex or rubber products or whether you have had any adverse reaction to Lidocaine or Epinephrine.  Please inform us of any prosthetic or artificial body parts such as artificial heart valve, joint replacements, etc., or similar condition that might require preoperative antibiotics.   We recommend avoidance of alcohol at least two weeks prior to surgery and continued avoidance for at least two weeks after surgery.   We recommend discontinuation of tobacco smoking at least two weeks prior to surgery and continued abstinence for at least two weeks after surgery.  Do not plan strenuous exercise, strenuous work or strenuous lifting for approximately four weeks after your surgery.   We request if you are unable to make your scheduled surgical appointment, please call us at least a week in advance or as soon as you are aware of a problem so that we can cancel or reschedule the appointment.   You MAY TAKE TYLENOL (acetaminophen) for pain as it is not a blood thinner.   PLEASE PLAN TO BE IN TOWN FOR TWO WEEKS FOLLOWING SURGERY, THIS IS IMPORTANT SO YOU CAN BE CHECKED FOR DRESSING CHANGES, SUTURE REMOVAL AND TO MONITOR FOR POSSIBLE COMPLICATIONS.    Recommend Voluma  2 syringes each side of mid face (total of 4 syringes), $700 per syringe. Recommend Restylane Defyne 2 syringes total for lower face, $650 per syringe  Will discuss with PCP the possibility of adding Metformin for adiposis dolorosa.  Dercum Disease.    Due to recent changes in  healthcare laws, you may see results of your pathology and/or laboratory studies on MyChart before the doctors have had a chance to review them. We understand that in some cases there may be results that are confusing or concerning to you. Please understand that not all results are received at the same time and often the doctors may  need to interpret multiple results in order to provide you with the best plan of care or course of treatment. Therefore, we ask that you please give [place] 2 business days to thoroughly review all your results before contacting the office for clarification. Should we see a critical lab result, you will be contacted sooner.   If You Need Anything After Your Visit  If you have any questions or concerns for your doctor, please call our main line at [phone] and press option 4 to reach your doctor's medical assistant. If no one answers, please leave a voicemail as directed and we will return your call as soon as possible. Messages left after 4 pm will be answered the following business day.   You may also send [place] a message via [place]. We typically respond to MyChart messages within 1-2 business days.  For prescription refills, please ask your pharmacy to contact our office. Our fax number is [phone].  If you have an urgent issue when the clinic is closed that cannot wait until the next business day, you can page your doctor at the number below.    Please note that while we do our best to be available for urgent issues outside of office hours, we are not available 24/7.   If you have an urgent issue and are unable to reach [place], you may choose to seek medical care at your doctor's office, retail clinic, urgent care center, or emergency room.  If you have a medical emergency, please immediately call 911 or go to the emergency department.  Pager Numbers  - Dr. Nehemiah Massed: [phone]  - Dr. Laurence Ferrari: [phone]  - Dr. Nicole Kindred: [phone]  In the event of inclement weather, please call our main line at [phone] for an update on the status of any delays or closures.  Dermatology Medication Tips: Please keep the boxes that topical medications come in in order to help keep track of the instructions about where and how to use these. Pharmacies typically print the medication instructions only  on the boxes and not directly on the medication tubes.   If your medication is too expensive, please contact our office at [phone] option 4 or send [place] a message through [place].   We are unable to tell what your co-pay for medications will be in advance as this is different depending on your insurance coverage. However, we may be able to find a substitute medication at lower cost or fill out paperwork to get insurance to cover a needed medication.   If a prior authorization is required to get your medication covered by your insurance company, please allow [place] 1-2 business days to complete this process.  Drug prices often vary depending on where the prescription is filled and some pharmacies may offer cheaper prices.  The website www.goodrx.com contains coupons for medications through different pharmacies. The prices here do not account for what the cost may be with help from insurance (it may be cheaper with your insurance), but the website can give you the price if you did not use any insurance.  - You can print the associated coupon and take it with your prescription to the  pharmacy.  - You may also stop by our office during regular business hours and pick up a GoodRx coupon card.  - If you need your prescription sent electronically to a different pharmacy, notify our office through [facility] or by phone at [phone] option 4.     Si Usted Necesita Algo Despus de Su Visita  Tambin puede enviarnos un mensaje a travs de Pharmacist, community. Por lo general respondemos a los mensajes de MyChart en el transcurso de 1 a 2 das hbiles.  Para renovar recetas, por favor pida a su farmacia que se ponga en contacto con nuestra oficina. Harland Dingwall de fax es [place] [phone].  Si tiene un asunto urgente cuando la clnica est cerrada y que no puede esperar hasta el siguiente da hbil, puede llamar/localizar a su doctor(a) al nmero que aparece a continuacin.   Por favor, tenga en cuenta  que aunque hacemos todo lo posible para estar disponibles para asuntos urgentes fuera del horario de [place], no estamos disponibles las 24 horas del da, los 7 das de la [place].   Si tiene un problema urgente y no puede comunicarse con nosotros, puede optar por buscar atencin mdica  en el consultorio de su doctor(a), en una clnica privada, en un centro de atencin urgente o en una sala de emergencias.  Si tiene Engineering geologist, por favor llame inmediatamente al 911 o vaya a la sala de emergencias.  Nmeros de bper  - Dr. Nehemiah Massed: [phone]  - Dra. Moye: [phone]  - Dra. Nicole Kindred: [phone]  En caso de inclemencias del [place], por favor llame a Johnsie Kindred principal al [phone] para una actualizacin sobre el [place] de cualquier retraso o cierre.  Consejos para la medicacin en dermatologa: Por favor, guarde las cajas en las que vienen los medicamentos de uso tpico para ayudarle a seguir las instrucciones sobre dnde y cmo usarlos. Las farmacias generalmente imprimen las instrucciones del medicamento slo en las cajas y no directamente en los tubos del [place].   Si su medicamento es muy caro, por favor, pngase en contacto con Zigmund Daniel llamando al [phone] y presione la opcin 4 o envenos un mensaje a travs de Pharmacist, community.   No podemos decirle cul ser su copago por los medicamentos por adelantado ya que esto es diferente dependiendo de la cobertura de su seguro. Sin embargo, es posible que podamos encontrar un medicamento sustituto a Electrical engineer un formulario para que el seguro cubra el medicamento que se considera necesario.   Si se requiere una autorizacin previa para que su compaa de seguros [place] su medicamento, por favor permtanos de 1 a 2 das hbiles para completar este proceso.  Los precios de los medicamentos varan con frecuencia dependiendo del Environmental consultant de dnde se surte la receta y alguna farmacias pueden ofrecer precios ms  baratos.  El sitio web www.goodrx.com tiene cupones para medicamentos de Airline pilot. Los precios aqu no tienen en cuenta lo que podra costar con la ayuda del seguro (puede ser ms barato con su seguro), pero el sitio web puede darle el precio si no utiliz Research scientist (physical sciences).  - Puede imprimir el cupn correspondiente y llevarlo con su receta a la farmacia.  - Tambin puede pasar por nuestra oficina durante el horario de atencin regular y Charity fundraiser una tarjeta de cupones de GoodRx.  - Si necesita que su receta se enve electrnicamente a Chiropodist, informe a nuestra oficina a travs de MyChart de [place] o por telfono llamando  al [phone] y presione la opcin 4.

## 2022-09-08 ENCOUNTER — Ambulatory Visit (INDEPENDENT_AMBULATORY_CARE_PROVIDER_SITE_OTHER): Payer: BC Managed Care – PPO | Admitting: Physician Assistant

## 2022-09-08 ENCOUNTER — Encounter: Payer: Self-pay | Admitting: Physician Assistant

## 2022-09-08 DIAGNOSIS — D509 Iron deficiency anemia, unspecified: Secondary | ICD-10-CM | POA: Diagnosis not present

## 2022-09-08 DIAGNOSIS — Z87440 Personal history of urinary (tract) infections: Secondary | ICD-10-CM

## 2022-09-08 DIAGNOSIS — R7303 Prediabetes: Secondary | ICD-10-CM | POA: Diagnosis not present

## 2022-09-08 NOTE — Progress Notes (Signed)
[facility] [address], [place] [address]  Internal MEDICINE  Office Visit Note  Patient Name: Andrea Bernard  [id number]  [id number]  Date of Service: 09/08/2022  Chief Complaint  Patient presents with   Follow-up    Weight Loss   Gastroesophageal Reflux    HPI Pt is here for routine follow up -Feeling much better. Less pain since being on ozempic . Not having as many headaches, able to close hands into fist without pain which she could not do previously -Still has her chronic neck and back pain, but other pain is better and overall feels better -Her dermatologist has recommended starting Metformin ER to help with skin condition of Adiposis dolorosa/Dercum disease. Patient states she did not want to start this without discussing with me in office today. Discussed this would be fine to try in addition to her ozempic, but possible to have GI side effects and will monitor. She states she goes back to dermatology on the 12th for a lipoma excision and will let her know that she does want to move forward with trying Metformin ER as she recommended. She is going to confirm dose with her and will let me know if I need to send script, but states dermatologist had planned to send in for her. -taking 0.'25mg'$  ozempic and continues to tolerate it well and will continue with this dose. Down another 7lbs since last visit -She did have visit with GI for IDA workup, she has been seeing hematology for iron infusions which she feels much better from -never heard anything about urology and will send new referral as she will have recurrent UTIs, fortunately does not feel she has this today  Current Medication: Outpatient Encounter Medications as of 09/08/2022  Medication Sig   albuterol (VENTOLIN HFA) 108 (90 Base) MCG/ACT inhaler TAKE 2 PUFFS BY MOUTH EVERY 6 HOURS AS NEEDED FOR WHEEZE OR SHORTNESS OF BREATH   fluconazole (DIFLUCAN) 150 MG tablet TAKE ONE TAB BY MOUTH EVERY WEEK  FOR FUNGAL INFECTION   gabapentin (NEURONTIN) 300 MG capsule Take 300 mg by mouth 3 (three) times daily.   ibuprofen (ADVIL) 200 MG tablet Take 200 mg by mouth as needed.   OXYCONTIN 80 MG 12 hr tablet Take 80 mg by mouth every 12 (twelve) hours.   OZEMPIC, 0.25 OR 0.5 MG/DOSE, 2 MG/3ML SOPN INJECT 0.'25MG'$  INTO THE SKIN ONCE A WEEK FOR 4 WEEKS THEN INCREASE TO 0.'5MG'$  WEEKLY.   pantoprazole (PROTONIX) 40 MG tablet TAKE 1 TABLET BY MOUTH TWICE A DAY   SUMAtriptan (IMITREX) 100 MG tablet TAKE 1 TABLET BY MOUTH DAILY AS NEEDED FOR MIGRAINE.   tobramycin (TOBREX) 0.3 % ophthalmic solution APPLY 1 DROP TO TOENAILS AT BEDTIME   Facility-Administered Encounter Medications as of 09/08/2022  Medication   iron sucrose (VENOFER) 200 mg IVPB   sodium chloride flush (NS) 0.9 % injection 10 mL    Surgical History: Past Surgical History:  Procedure Laterality Date   AUGMENTATION MAMMAPLASTY Bilateral 12/10/2020   BREAST ENHANCEMENT SURGERY Bilateral 12/04/2020   carpel tunnel release Bilateral    CESAREAN SECTION     x 4   COLONOSCOPY WITH PROPOFOL N/A 09/04/2017   Procedure: COLONOSCOPY WITH PROPOFOL;  Surgeon: [name], MD;  Location: [facility] ENDOSCOPY;  Service: Gastroenterology;  Laterality: N/A;   COLONOSCOPY WITH PROPOFOL N/A 12/21/2017   Procedure: COLONOSCOPY WITH PROPOFOL;  Surgeon: [name], MD;  Location: [facility] ENDOSCOPY;  Service: Gastroenterology;  Laterality: N/A;   ESOPHAGOGASTRODUODENOSCOPY (EGD) WITH  PROPOFOL N/A 09/04/2017   Procedure: ESOPHAGOGASTRODUODENOSCOPY (EGD) WITH PROPOFOL;  Surgeon: [name], MD;  Location: [facility] ENDOSCOPY;  Service: Gastroenterology;  Laterality: N/A;   HERNIA REPAIR     HERNIA REPAIR  12/04/2020   TUBAL LIGATION     tummy tuck  12/04/2020   removed 20 masses on stomach   TUMOR REMOVAL     x8    Medical History: Past Medical History:  Diagnosis Date   Arthritis    Dysplastic nevus 03/02/2008   Right lateral thigh. Moderate atypia, limited  margins free.   GERD (gastroesophageal reflux disease)    Headache    Iron deficiency anemia 06/15/2017   Neurofibromatosis ([place])     Family History: Family History  Problem Relation Age of Onset   Anemia Mother    Lupus Mother    Sjogren's syndrome Mother    Rheum arthritis Mother    Schizophrenia Father    Neurofibromatosis Brother    Neurofibromatosis Son    Neurofibromatosis Son    Neurofibromatosis Daughter    Bladder Cancer Neg Hx    Kidney cancer Neg Hx    Breast cancer Neg Hx    Ovarian cancer Neg Hx    Colon cancer Neg Hx     Social History   Socioeconomic History   Marital status: Married    Spouse name: Not on file   Number of children: Not on file   Years of education: Not on file   Highest education level: Not on file  Occupational History   Not on file  Tobacco Use   Smoking status: Former    Types: Cigarettes   Smokeless tobacco: Never   Tobacco comments:    Age [age]  Vaping Use   Vaping Use: Never used  Substance and Sexual Activity   Alcohol use: No   Drug use: Not Currently   Sexual activity: Yes    Birth control/protection: Surgical  Other Topics Concern   Not on file  Social History Narrative   Not on file   Social Determinants of Health   Financial Resource Strain: Not on file  Food Insecurity: Not on file  Transportation Needs: Not on file  Physical Activity: Not on file  Stress: Not on file  Social Connections: Not on file  Intimate Partner Violence: Not on file      Review of Systems  Constitutional:  Negative for chills, fatigue and unexpected weight change.  HENT:  Negative for congestion, rhinorrhea, sneezing and sore throat.   Eyes:  Negative for redness.  Respiratory:  Negative for cough, chest tightness and shortness of breath.   Cardiovascular:  Negative for chest pain and palpitations.  Gastrointestinal:  Negative for abdominal pain, constipation, diarrhea, nausea and vomiting.  Genitourinary:  Negative for  dysuria and frequency.  Musculoskeletal:  Positive for arthralgias. Negative for back pain, joint swelling and neck pain.  Skin:  Negative for rash.  Neurological: Negative.  Negative for tremors and numbness.  Hematological:  Negative for adenopathy. Does not bruise/bleed easily.  Psychiatric/Behavioral:  Negative for behavioral problems (Depression), sleep disturbance and suicidal ideas. The patient is not nervous/anxious.     Vital Signs: BP 110/68   Pulse 70   Temp 97.9 F (36.6 C)   Resp 16   Ht '5\' 4"'$  (1.626 m)   Wt 146 lb 9.6 oz (66.5 kg)   LMP 10/06/2018   SpO2 96%   BMI 25.16 kg/m    Physical Exam Vitals and nursing note reviewed.  Constitutional:  General: She is not in acute distress.    Appearance: She is well-developed. She is not diaphoretic.  HENT:     Head: Normocephalic and atraumatic.     Mouth/Throat:     Pharynx: No oropharyngeal exudate.  Eyes:     Pupils: Pupils are equal, round, and reactive to light.  Neck:     Thyroid: No thyromegaly.     Vascular: No JVD.     Trachea: No tracheal deviation.  Cardiovascular:     Rate and Rhythm: Normal rate and regular rhythm.     Heart sounds: Normal heart sounds. No murmur heard.    No friction rub. No gallop.  Pulmonary:     Effort: Pulmonary effort is normal. No respiratory distress.     Breath sounds: No wheezing or rales.  Chest:     Chest wall: No tenderness.  Breasts:    Right: Normal. No mass.     Left: No mass.  Abdominal:     General: Bowel sounds are normal.     Palpations: Abdomen is soft.     Tenderness: There is no abdominal tenderness.  Musculoskeletal:        General: Normal range of motion.     Cervical back: Normal range of motion and neck supple.  Lymphadenopathy:     Cervical: No cervical adenopathy.  Skin:    General: Skin is warm and dry.  Neurological:     Mental Status: She is alert and oriented to person, place, and time.     Cranial Nerves: No cranial nerve deficit.   Psychiatric:        Behavior: Behavior normal.        Thought Content: Thought content normal.        Judgment: Judgment normal.        Assessment/Plan: 1. Prediabetes Continue ozempic as before while working on diet and exercise. May also start on Metformin ER with dermatology which would help sugars as well as skin condition. Pt will let us know if script needed or if dermatology is sending  2. History of recurrent UTIs Pt would like to establish with urology due to numerous UTIs over the past few years and will send referral - Ambulatory referral to Urology  3. Iron deficiency anemia, unspecified iron deficiency anemia type Followed by hematology and GI   General Counseling: Shaily verbalizes understanding of the findings of todays visit and agrees with plan of treatment. I have discussed any further diagnostic evaluation that may be needed or ordered today. We also reviewed her medications today. she has been encouraged to call the office with any questions or concerns that should arise related to todays visit.    Orders Placed This Encounter  Procedures   Ambulatory referral to Urology    No orders of the defined types were placed in this encounter.   This patient was seen by Drema Dallas, PA-C in collaboration with Dr. Clayborn Bigness as a part of collaborative care agreement.   Total time spent:30 Minutes Time spent includes review of chart, medications, test results, and follow up plan with the patient.      Dr [name] Internal medicine

## 2022-09-16 ENCOUNTER — Ambulatory Visit (INDEPENDENT_AMBULATORY_CARE_PROVIDER_SITE_OTHER): Payer: BC Managed Care – PPO | Admitting: Dermatology

## 2022-09-16 DIAGNOSIS — R7309 Other abnormal glucose: Secondary | ICD-10-CM

## 2022-09-16 DIAGNOSIS — E882 Lipomatosis, not elsewhere classified: Secondary | ICD-10-CM

## 2022-09-16 MED ORDER — METFORMIN HCL ER 500 MG PO TB24: 500.0000 mg | ORAL_TABLET | Freq: Every day | ORAL | 2 refills | Status: DC

## 2022-09-16 NOTE — Progress Notes (Signed)
   Follow-Up Visit   Subjective  Andrea Bernard is a 56 y.o. female who presents for the following: Procedure (Patient here today for excision at left thigh.).   The following portions of the chart were reviewed this encounter and updated as appropriate:   Tobacco  Allergies  Meds  Problems  Med Hx  Surg Hx  Fam Hx      Review of Systems:  No other skin or systemic complaints except as noted in HPI or Assessment and Plan.  Objective  Well appearing patient in no apparent distress; mood and affect are within normal limits.  A focused examination was performed including legs. Relevant physical exam findings are noted in the Assessment and Plan.  scattered body Multiple tender scattered rubbery subcutaneous nodules of various sizes     Assessment & Plan  Adiposis dolorosa scattered body  Chronic and persistent condition with duration over one year. Condition is significantly bothersome/symptomatic for patient. Currently flared.  Start metformin 500 mg extended release daily with evening meal  Reviewed risk of nausea and GI side effects  Discussed ILK to shrink lipoma at her low back but deferred today after discussion of potential side effects. She would like to try the metformin first.    Related Medications metFORMIN (GLUCOPHAGE-XR) 500 MG 24 hr tablet Take 1 tablet (500 mg total) by mouth daily with supper.  Elevated hemoglobin A1c  Related Medications metFORMIN (GLUCOPHAGE-XR) 500 MG 24 hr tablet Take 1 tablet (500 mg total) by mouth daily with supper.   Return in about 3 months (around 12/16/2022) for adiposis dolorosa .  Graciella Belton, RMA, am acting as scribe for Forest Gleason, MD .   Documentation: I have reviewed the above documentation for accuracy and completeness, and I agree with the above.  Forest Gleason, MD

## 2022-09-16 NOTE — Patient Instructions (Signed)
Due to recent changes in healthcare laws, you may see results of your pathology and/or laboratory studies on MyChart before the doctors have had a chance to review them. We understand that in some cases there may be results that are confusing or concerning to you. Please understand that not all results are received at the same time and often the doctors may need to interpret multiple results in order to provide you with the best plan of care or course of treatment. Therefore, we ask that you please give us 2 business days to thoroughly review all your results before contacting the office for clarification. Should we see a critical lab result, you will be contacted sooner.   If You Need Anything After Your Visit  If you have any questions or concerns for your doctor, please call our main line at [phone] and press option 4 to reach your doctor's medical assistant. If no one answers, please leave a voicemail as directed and we will return your call as soon as possible. Messages left after 4 pm will be answered the following business day.   You may also send us a message via MyChart. We typically respond to MyChart messages within 1-2 business days.  For prescription refills, please ask your pharmacy to contact our office. Our fax number is [phone].  If you have an urgent issue when the clinic is closed that cannot wait until the next business day, you can page your doctor at the number below.    Please note that while we do our best to be available for urgent issues outside of office hours, we are not available 24/7.   If you have an urgent issue and are unable to reach us, you may choose to seek medical care at your doctor's office, retail clinic, urgent care center, or emergency room.  If you have a medical emergency, please immediately call 911 or go to the emergency department.  Pager Numbers  - Dr. Kowalski: [phone]  - Dr. Moye: [phone]  - Dr. Stewart:  [phone]  In the event of inclement weather, please call our main line at [phone] for an update on the status of any delays or closures.  Dermatology Medication Tips: Please keep the boxes that topical medications come in in order to help keep track of the instructions about where and how to use these. Pharmacies typically print the medication instructions only on the boxes and not directly on the medication tubes.   If your medication is too expensive, please contact our office at [phone] option 4 or send us a message through MyChart.   We are unable to tell what your co-pay for medications will be in advance as this is different depending on your insurance coverage. However, we may be able to find a substitute medication at lower cost or fill out paperwork to get insurance to cover a needed medication.   If a prior authorization is required to get your medication covered by your insurance company, please allow us 1-2 business days to complete this process.  Drug prices often vary depending on where the prescription is filled and some pharmacies may offer cheaper prices.  The website www.goodrx.com contains coupons for medications through different pharmacies. The prices here do not account for what the cost may be with help from insurance (it may be cheaper with your insurance), but the website can give you the price if you did not use any insurance.  - You can print the associated coupon and take it with   your prescription to the pharmacy.  - You may also stop by our office during regular business hours and pick up a GoodRx coupon card.  - If you need your prescription sent electronically to a different pharmacy, notify our office through [place] MyChart or by phone at [phone] option 4.     Si Usted Necesita Algo Despus de Su Visita  Tambin puede enviarnos un mensaje a travs de MyChart. Por lo general respondemos a los mensajes de MyChart en el transcurso de 1 a 2  das hbiles.  Para renovar recetas, por favor pida a su farmacia que se ponga en contacto con nuestra oficina. Nuestro nmero de fax es el [phone].  Si tiene un asunto urgente cuando la clnica est cerrada y que no puede esperar hasta el siguiente da hbil, puede llamar/localizar a su doctor(a) al nmero que aparece a continuacin.   Por favor, tenga en cuenta que aunque hacemos todo lo posible para estar disponibles para asuntos urgentes fuera del horario de oficina, no estamos disponibles las 24 horas del da, los 7 das de la semana.   Si tiene un problema urgente y no puede comunicarse con nosotros, puede optar por buscar atencin mdica  en el consultorio de su doctor(a), en una clnica privada, en un centro de atencin urgente o en una sala de emergencias.  Si tiene una emergencia mdica, por favor llame inmediatamente al 911 o vaya a la sala de emergencias.  Nmeros de bper  - Dr. Kowalski: [phone]  - Dra. Moye: [phone]  - Dra. Stewart: [phone]  En caso de inclemencias del tiempo, por favor llame a nuestra lnea principal al [phone] para una actualizacin sobre el estado de cualquier retraso o cierre.  Consejos para la medicacin en dermatologa: Por favor, guarde las cajas en las que vienen los medicamentos de uso tpico para ayudarle a seguir las instrucciones sobre dnde y cmo usarlos. Las farmacias generalmente imprimen las instrucciones del medicamento slo en las cajas y no directamente en los tubos del medicamento.   Si su medicamento es muy caro, por favor, pngase en contacto con nuestra oficina llamando al [phone] y presione la opcin 4 o envenos un mensaje a travs de MyChart.   No podemos decirle cul ser su copago por los medicamentos por adelantado ya que esto es diferente dependiendo de la cobertura de su seguro. Sin embargo, es posible que podamos encontrar un medicamento sustituto a menor costo o llenar un formulario para que el  seguro cubra el medicamento que se considera necesario.   Si se requiere una autorizacin previa para que su compaa de seguros cubra su medicamento, por favor permtanos de 1 a 2 das hbiles para completar este proceso.  Los precios de los medicamentos varan con frecuencia dependiendo del lugar de dnde se surte la receta y alguna farmacias pueden ofrecer precios ms baratos.  El sitio web www.goodrx.com tiene cupones para medicamentos de diferentes farmacias. Los precios aqu no tienen en cuenta lo que podra costar con la ayuda del seguro (puede ser ms barato con su seguro), pero el sitio web puede darle el precio si no utiliz ningn seguro.  - Puede imprimir el cupn correspondiente y llevarlo con su receta a la farmacia.  - Tambin puede pasar por nuestra oficina durante el horario de atencin regular y recoger una tarjeta de cupones de GoodRx.  - Si necesita que su receta se enve electrnicamente a una farmacia diferente, informe a nuestra oficina a travs de MyChart de [place]   o por telfono llamando al [phone] y presione la opcin 4.  

## 2022-09-25 ENCOUNTER — Encounter: Payer: Self-pay | Admitting: Dermatology

## 2022-10-01 ENCOUNTER — Ambulatory Visit: Payer: BC Managed Care – PPO | Admitting: Dermatology

## 2022-10-01 ENCOUNTER — Ambulatory Visit: Payer: BC Managed Care – PPO

## 2022-10-02 ENCOUNTER — Other Ambulatory Visit: Payer: Self-pay | Admitting: Physician Assistant

## 2022-10-02 DIAGNOSIS — G43909 Migraine, unspecified, not intractable, without status migrainosus: Secondary | ICD-10-CM

## 2022-10-02 DIAGNOSIS — N644 Mastodynia: Secondary | ICD-10-CM

## 2022-10-02 DIAGNOSIS — Q8500 Neurofibromatosis, unspecified: Secondary | ICD-10-CM

## 2022-10-02 DIAGNOSIS — R300 Dysuria: Secondary | ICD-10-CM

## 2022-10-02 DIAGNOSIS — G894 Chronic pain syndrome: Secondary | ICD-10-CM

## 2022-10-02 DIAGNOSIS — E7800 Pure hypercholesterolemia, unspecified: Secondary | ICD-10-CM

## 2022-10-02 DIAGNOSIS — Z9882 Breast implant status: Secondary | ICD-10-CM

## 2022-10-02 DIAGNOSIS — L601 Onycholysis: Secondary | ICD-10-CM

## 2022-10-02 DIAGNOSIS — K219 Gastro-esophageal reflux disease without esophagitis: Secondary | ICD-10-CM

## 2022-10-02 DIAGNOSIS — Z0001 Encounter for general adult medical examination with abnormal findings: Secondary | ICD-10-CM

## 2022-10-02 DIAGNOSIS — D509 Iron deficiency anemia, unspecified: Secondary | ICD-10-CM

## 2022-10-16 ENCOUNTER — Ambulatory Visit: Payer: BC Managed Care – PPO | Admitting: Dermatology

## 2022-10-24 ENCOUNTER — Other Ambulatory Visit: Payer: Self-pay | Admitting: Physician Assistant

## 2022-10-25 ENCOUNTER — Other Ambulatory Visit: Payer: Self-pay | Admitting: Dermatology

## 2022-10-25 DIAGNOSIS — R7309 Other abnormal glucose: Secondary | ICD-10-CM

## 2022-10-25 DIAGNOSIS — E882 Lipomatosis, not elsewhere classified: Secondary | ICD-10-CM

## 2022-10-28 ENCOUNTER — Ambulatory Visit (INDEPENDENT_AMBULATORY_CARE_PROVIDER_SITE_OTHER): Payer: Self-pay | Admitting: Dermatology

## 2022-10-28 ENCOUNTER — Encounter: Payer: Self-pay | Admitting: Dermatology

## 2022-10-28 DIAGNOSIS — L988 Other specified disorders of the skin and subcutaneous tissue: Secondary | ICD-10-CM

## 2022-10-28 NOTE — Progress Notes (Signed)
   Follow-Up Visit   Subjective  Andrea Bernard is a 57 y.o. female who presents for the following: Facial Elastosis (Here for fillers).  The following portions of the chart were reviewed this encounter and updated as appropriate:  Tobacco  Allergies  Meds  Problems  Med Hx  Surg Hx  Fam Hx      Review of Systems: No other skin or systemic complaints except as noted in HPI or Assessment and Plan.   Objective  Well appearing patient in no apparent distress; mood and affect are within normal limits.  A focused examination was performed including face. Relevant physical exam findings are noted in the Assessment and Plan.  Rhytides and volume loss.   face After Filler Photos                              Assessment & Plan  Elastosis of skin face  Filling material injection - face Prior to the procedure, the patient's past medical history, allergies and the rare but potential risks and complications were reviewed with the patient and a signed consent was obtained. Pre and post-treatment care was discussed and instructions provided.  Location: mid face  Filler Type: Juvederm Voluma x 2 syringes Lot: [id number] Exp: 06/14/2023  Procedure: Lidocaine-tetracaine ointment was applied to treatment areas to achieve good local anesthesia. The area was prepped thoroughly with Puracyn. A cannula was used to inject the filler. Insertion sites were prepped with puracyn and lidocaine was injected to achieve good local anesthesia. A 23-gauge needle was used to create an opening at the insertion site and then the cannula was inserted using sterile technique. Prior to injecting filler at the desired location, the syringe plunger was drawn back to ensure there was no flash of blood in order to minimize risk of intravascular injection and vascular occlusion.  After injection of the filler, the treated areas were cleansed and iced to reduce swelling. Post-treatment  instructions were reviewed with the patient.       Patient tolerated the procedure well. The patient will call with any problems, questions or concerns prior to their next appointment.    Return for [facility] as scheduled.  I, Emelia Salisbury, CMA, am acting as scribe for Forest Gleason, MD.  Documentation: I have reviewed the above documentation for accuracy and completeness, and I agree with the above.  Forest Gleason, MD

## 2022-10-28 NOTE — Patient Instructions (Signed)
Due to recent changes in healthcare laws, you may see results of your pathology and/or laboratory studies on MyChart before the doctors have had a chance to review them. We understand that in some cases there may be results that are confusing or concerning to you. Please understand that not all results are received at the same time and often the doctors may need to interpret multiple results in order to provide you with the best plan of care or course of treatment. Therefore, we ask that you please give us 2 business days to thoroughly review all your results before contacting the office for clarification. Should we see a critical lab result, you will be contacted sooner.   If You Need Anything After Your Visit  If you have any questions or concerns for your doctor, please call our main line at [phone] and press option 4 to reach your doctor's medical assistant. If no one answers, please leave a voicemail as directed and we will return your call as soon as possible. Messages left after 4 pm will be answered the following business day.   You may also send us a message via MyChart. We typically respond to MyChart messages within 1-2 business days.  For prescription refills, please ask your pharmacy to contact our office. Our fax number is [phone].  If you have an urgent issue when the clinic is closed that cannot wait until the next business day, you can page your doctor at the number below.    Please note that while we do our best to be available for urgent issues outside of office hours, we are not available 24/7.   If you have an urgent issue and are unable to reach us, you may choose to seek medical care at your doctor's office, retail clinic, urgent care center, or emergency room.  If you have a medical emergency, please immediately call 911 or go to the emergency department.  Pager Numbers  - Dr. Kowalski: [phone]  - Dr. Moye: [phone]  - Dr. Stewart:  [phone]  In the event of inclement weather, please call our main line at [phone] for an update on the status of any delays or closures.  Dermatology Medication Tips: Please keep the boxes that topical medications come in in order to help keep track of the instructions about where and how to use these. Pharmacies typically print the medication instructions only on the boxes and not directly on the medication tubes.   If your medication is too expensive, please contact our office at [phone] option 4 or send us a message through MyChart.   We are unable to tell what your co-pay for medications will be in advance as this is different depending on your insurance coverage. However, we may be able to find a substitute medication at lower cost or fill out paperwork to get insurance to cover a needed medication.   If a prior authorization is required to get your medication covered by your insurance company, please allow us 1-2 business days to complete this process.  Drug prices often vary depending on where the prescription is filled and some pharmacies may offer cheaper prices.  The website www.goodrx.com contains coupons for medications through different pharmacies. The prices here do not account for what the cost may be with help from insurance (it may be cheaper with your insurance), but the website can give you the price if you did not use any insurance.  - You can print the associated coupon and take it with   your prescription to the pharmacy.  - You may also stop by our office during regular business hours and pick up a GoodRx coupon card.  - If you need your prescription sent electronically to a different pharmacy, notify our office through [place] MyChart or by phone at [phone] option 4.     Si Usted Necesita Algo Despus de Su Visita  Tambin puede enviarnos un mensaje a travs de MyChart. Por lo general respondemos a los mensajes de MyChart en el transcurso de 1 a 2  das hbiles.  Para renovar recetas, por favor pida a su farmacia que se ponga en contacto con nuestra oficina. Nuestro nmero de fax es el [phone].  Si tiene un asunto urgente cuando la clnica est cerrada y que no puede esperar hasta el siguiente da hbil, puede llamar/localizar a su doctor(a) al nmero que aparece a continuacin.   Por favor, tenga en cuenta que aunque hacemos todo lo posible para estar disponibles para asuntos urgentes fuera del horario de oficina, no estamos disponibles las 24 horas del da, los 7 das de la semana.   Si tiene un problema urgente y no puede comunicarse con nosotros, puede optar por buscar atencin mdica  en el consultorio de su doctor(a), en una clnica privada, en un centro de atencin urgente o en una sala de emergencias.  Si tiene una emergencia mdica, por favor llame inmediatamente al 911 o vaya a la sala de emergencias.  Nmeros de bper  - Dr. Kowalski: [phone]  - Dra. Moye: [phone]  - Dra. Stewart: [phone]  En caso de inclemencias del tiempo, por favor llame a nuestra lnea principal al [phone] para una actualizacin sobre el estado de cualquier retraso o cierre.  Consejos para la medicacin en dermatologa: Por favor, guarde las cajas en las que vienen los medicamentos de uso tpico para ayudarle a seguir las instrucciones sobre dnde y cmo usarlos. Las farmacias generalmente imprimen las instrucciones del medicamento slo en las cajas y no directamente en los tubos del medicamento.   Si su medicamento es muy caro, por favor, pngase en contacto con nuestra oficina llamando al [phone] y presione la opcin 4 o envenos un mensaje a travs de MyChart.   No podemos decirle cul ser su copago por los medicamentos por adelantado ya que esto es diferente dependiendo de la cobertura de su seguro. Sin embargo, es posible que podamos encontrar un medicamento sustituto a menor costo o llenar un formulario para que el  seguro cubra el medicamento que se considera necesario.   Si se requiere una autorizacin previa para que su compaa de seguros cubra su medicamento, por favor permtanos de 1 a 2 das hbiles para completar este proceso.  Los precios de los medicamentos varan con frecuencia dependiendo del lugar de dnde se surte la receta y alguna farmacias pueden ofrecer precios ms baratos.  El sitio web www.goodrx.com tiene cupones para medicamentos de diferentes farmacias. Los precios aqu no tienen en cuenta lo que podra costar con la ayuda del seguro (puede ser ms barato con su seguro), pero el sitio web puede darle el precio si no utiliz ningn seguro.  - Puede imprimir el cupn correspondiente y llevarlo con su receta a la farmacia.  - Tambin puede pasar por nuestra oficina durante el horario de atencin regular y recoger una tarjeta de cupones de GoodRx.  - Si necesita que su receta se enve electrnicamente a una farmacia diferente, informe a nuestra oficina a travs de MyChart de [place]   o por telfono llamando al [phone] y presione la opcin 4.  

## 2022-10-30 ENCOUNTER — Encounter: Payer: Self-pay | Admitting: Dermatology

## 2022-10-30 ENCOUNTER — Ambulatory Visit (INDEPENDENT_AMBULATORY_CARE_PROVIDER_SITE_OTHER): Payer: BC Managed Care – PPO | Admitting: Dermatology

## 2022-10-30 DIAGNOSIS — R609 Edema, unspecified: Secondary | ICD-10-CM

## 2022-10-30 NOTE — Patient Instructions (Signed)
Due to recent changes in healthcare laws, you may see results of your pathology and/or laboratory studies on MyChart before the doctors have had a chance to review them. We understand that in some cases there may be results that are confusing or concerning to you. Please understand that not all results are received at the same time and often the doctors may need to interpret multiple results in order to provide you with the best plan of care or course of treatment. Therefore, we ask that you please give us 2 business days to thoroughly review all your results before contacting the office for clarification. Should we see a critical lab result, you will be contacted sooner.   If You Need Anything After Your Visit  If you have any questions or concerns for your doctor, please call our main line at [phone] and press option 4 to reach your doctor's medical assistant. If no one answers, please leave a voicemail as directed and we will return your call as soon as possible. Messages left after 4 pm will be answered the following business day.   You may also send us a message via MyChart. We typically respond to MyChart messages within 1-2 business days.  For prescription refills, please ask your pharmacy to contact our office. Our fax number is [phone].  If you have an urgent issue when the clinic is closed that cannot wait until the next business day, you can page your doctor at the number below.    Please note that while we do our best to be available for urgent issues outside of office hours, we are not available 24/7.   If you have an urgent issue and are unable to reach us, you may choose to seek medical care at your doctor's office, retail clinic, urgent care center, or emergency room.  If you have a medical emergency, please immediately call 911 or go to the emergency department.  Pager Numbers  - Dr. Kowalski: [phone]  - Dr. Moye: [phone]  - Dr. Stewart:  [phone]  In the event of inclement weather, please call our main line at [phone] for an update on the status of any delays or closures.  Dermatology Medication Tips: Please keep the boxes that topical medications come in in order to help keep track of the instructions about where and how to use these. Pharmacies typically print the medication instructions only on the boxes and not directly on the medication tubes.   If your medication is too expensive, please contact our office at [phone] option 4 or send us a message through MyChart.   We are unable to tell what your co-pay for medications will be in advance as this is different depending on your insurance coverage. However, we may be able to find a substitute medication at lower cost or fill out paperwork to get insurance to cover a needed medication.   If a prior authorization is required to get your medication covered by your insurance company, please allow us 1-2 business days to complete this process.  Drug prices often vary depending on where the prescription is filled and some pharmacies may offer cheaper prices.  The website www.goodrx.com contains coupons for medications through different pharmacies. The prices here do not account for what the cost may be with help from insurance (it may be cheaper with your insurance), but the website can give you the price if you did not use any insurance.  - You can print the associated coupon and take it with   your prescription to the pharmacy.  - You may also stop by our office during regular business hours and pick up a GoodRx coupon card.  - If you need your prescription sent electronically to a different pharmacy, notify our office through [place] MyChart or by phone at [phone] option 4.     Si Usted Necesita Algo Despus de Su Visita  Tambin puede enviarnos un mensaje a travs de MyChart. Por lo general respondemos a los mensajes de MyChart en el transcurso de 1 a 2  das hbiles.  Para renovar recetas, por favor pida a su farmacia que se ponga en contacto con nuestra oficina. Nuestro nmero de fax es el [phone].  Si tiene un asunto urgente cuando la clnica est cerrada y que no puede esperar hasta el siguiente da hbil, puede llamar/localizar a su doctor(a) al nmero que aparece a continuacin.   Por favor, tenga en cuenta que aunque hacemos todo lo posible para estar disponibles para asuntos urgentes fuera del horario de oficina, no estamos disponibles las 24 horas del da, los 7 das de la semana.   Si tiene un problema urgente y no puede comunicarse con nosotros, puede optar por buscar atencin mdica  en el consultorio de su doctor(a), en una clnica privada, en un centro de atencin urgente o en una sala de emergencias.  Si tiene una emergencia mdica, por favor llame inmediatamente al 911 o vaya a la sala de emergencias.  Nmeros de bper  - Dr. Kowalski: [phone]  - Dra. Moye: [phone]  - Dra. Stewart: [phone]  En caso de inclemencias del tiempo, por favor llame a nuestra lnea principal al [phone] para una actualizacin sobre el estado de cualquier retraso o cierre.  Consejos para la medicacin en dermatologa: Por favor, guarde las cajas en las que vienen los medicamentos de uso tpico para ayudarle a seguir las instrucciones sobre dnde y cmo usarlos. Las farmacias generalmente imprimen las instrucciones del medicamento slo en las cajas y no directamente en los tubos del medicamento.   Si su medicamento es muy caro, por favor, pngase en contacto con nuestra oficina llamando al [phone] y presione la opcin 4 o envenos un mensaje a travs de MyChart.   No podemos decirle cul ser su copago por los medicamentos por adelantado ya que esto es diferente dependiendo de la cobertura de su seguro. Sin embargo, es posible que podamos encontrar un medicamento sustituto a menor costo o llenar un formulario para que el  seguro cubra el medicamento que se considera necesario.   Si se requiere una autorizacin previa para que su compaa de seguros cubra su medicamento, por favor permtanos de 1 a 2 das hbiles para completar este proceso.  Los precios de los medicamentos varan con frecuencia dependiendo del lugar de dnde se surte la receta y alguna farmacias pueden ofrecer precios ms baratos.  El sitio web www.goodrx.com tiene cupones para medicamentos de diferentes farmacias. Los precios aqu no tienen en cuenta lo que podra costar con la ayuda del seguro (puede ser ms barato con su seguro), pero el sitio web puede darle el precio si no utiliz ningn seguro.  - Puede imprimir el cupn correspondiente y llevarlo con su receta a la farmacia.  - Tambin puede pasar por nuestra oficina durante el horario de atencin regular y recoger una tarjeta de cupones de GoodRx.  - Si necesita que su receta se enve electrnicamente a una farmacia diferente, informe a nuestra oficina a travs de MyChart de [place]   o por telfono llamando al [phone] y presione la opcin 4.  

## 2022-10-30 NOTE — Progress Notes (Signed)
   Follow-Up Visit   Subjective  Andrea Bernard is a 57 y.o. female who presents for the following: Skin Problem (C/O complications with filler. Below left eye).  The following portions of the chart were reviewed this encounter and updated as appropriate:  Tobacco  Allergies  Meds  Problems  Med Hx  Surg Hx  Fam Hx      Review of Systems: No other skin or systemic complaints except as noted in HPI or Assessment and Plan.   Objective  Well appearing patient in no apparent distress; mood and affect are within normal limits.  A focused examination was performed including face. Relevant physical exam findings are noted in the Assessment and Plan.  Left Malar Cheek Mild edema without change in skin color or perfusion   Assessment & Plan  Swelling Left Malar Cheek  No signs of vessel obstruction. Advised only mild swelling seen. patient with tenderness on pressing on top of the infraorbital nerve -- advised this is normal; patient reassured   Return for Follow Up As Scheduled.  I, Emelia Salisbury, CMA, am acting as scribe for Forest Gleason, MD.  Documentation: I have reviewed the above documentation for accuracy and completeness, and I agree with the above.  Forest Gleason, MD

## 2022-11-03 ENCOUNTER — Encounter: Payer: Self-pay | Admitting: Dermatology

## 2022-11-14 ENCOUNTER — Other Ambulatory Visit: Payer: Self-pay | Admitting: Physician Assistant

## 2022-11-14 DIAGNOSIS — R7303 Prediabetes: Secondary | ICD-10-CM

## 2022-11-17 ENCOUNTER — Ambulatory Visit: Payer: BC Managed Care – PPO | Admitting: Urology

## 2022-11-17 VITALS — BP 101/63 | HR 69 | Ht 64.0 in | Wt 147.1 lb

## 2022-11-17 DIAGNOSIS — N281 Cyst of kidney, acquired: Secondary | ICD-10-CM

## 2022-11-17 DIAGNOSIS — R82998 Other abnormal findings in urine: Secondary | ICD-10-CM

## 2022-11-17 DIAGNOSIS — R300 Dysuria: Secondary | ICD-10-CM

## 2022-11-17 DIAGNOSIS — R350 Frequency of micturition: Secondary | ICD-10-CM

## 2022-11-17 DIAGNOSIS — R39198 Other difficulties with micturition: Secondary | ICD-10-CM

## 2022-11-17 DIAGNOSIS — N390 Urinary tract infection, site not specified: Secondary | ICD-10-CM

## 2022-11-17 DIAGNOSIS — Z87440 Personal history of urinary (tract) infections: Secondary | ICD-10-CM

## 2022-11-17 DIAGNOSIS — N2889 Other specified disorders of kidney and ureter: Secondary | ICD-10-CM | POA: Diagnosis not present

## 2022-11-17 DIAGNOSIS — N3020 Other chronic cystitis without hematuria: Secondary | ICD-10-CM

## 2022-11-17 LAB — URINALYSIS, COMPLETE
Bilirubin, UA: NEGATIVE
Glucose, UA: NEGATIVE
Ketones, UA: NEGATIVE
Leukocytes,UA: NEGATIVE
Nitrite, UA: NEGATIVE
Protein,UA: NEGATIVE
RBC, UA: NEGATIVE
Specific Gravity, UA: 1.02 (ref 1.005–1.030)
Urobilinogen, Ur: 0.2 mg/dL (ref 0.2–1.0)
pH, UA: 6 (ref 5.0–7.5)

## 2022-11-17 LAB — MICROSCOPIC EXAMINATION: Epithelial Cells (non renal): >10 /hpf — AB (ref 0–10)

## 2022-11-17 MED ORDER — CIPROFLOXACIN HCL 250 MG PO TABS: 250.0000 mg | ORAL_TABLET | Freq: Two times a day (BID) | ORAL | 0 refills | Status: DC

## 2022-11-17 MED ORDER — TRIMETHOPRIM 100 MG PO TABS: 100.0000 mg | ORAL_TABLET | Freq: Every day | ORAL | 11 refills | Status: DC

## 2022-11-17 NOTE — Progress Notes (Signed)
11/17/2022 8:38 AM   Andrea Bernard December 12, 1965 [id number]  Referring provider: Mylinda Latina, PA-C [place],  [place] [address]  No chief complaint on file.   HPI: Consulted to assess the patient for recurrent bladder infections.  She was getting them almost monthly.  When she went on Ozempic things settle down for 5 months but she just had another infection.  She gets pain cloudy urine and sediment and is difficult voiding.  She often has Streptococcus.  She self treated herself Friday night and finished her fifth tablet last night and is starting to feel better.  Last culture was E. coli sensitive to a number of antibiotics  At baseline she voids every 2 hours gets up once a night and is continent  She may have had a urethral dilation and/or stone as a child.  She is prediabetic.  She has not had a hysterectomy.  When I reviewed the urology note for 2019 she had a CT scan and ultrasound that had an indeterminate lesion in the left kidney but an MRI was never ordered.  Bowel movements normal     PMH: Past Medical History:  Diagnosis Date   Arthritis    Dysplastic nevus 03/02/2008   Right lateral thigh. Moderate atypia, limited margins free.   GERD (gastroesophageal reflux disease)    Headache    Iron deficiency anemia 06/15/2017   Neurofibromatosis [facility])     Surgical History: Past Surgical History:  Procedure Laterality Date   AUGMENTATION MAMMAPLASTY Bilateral 12/10/2020   BREAST ENHANCEMENT SURGERY Bilateral 12/04/2020   carpel tunnel release Bilateral    CESAREAN SECTION     x 4   COLONOSCOPY WITH PROPOFOL N/A 09/04/2017   Procedure: COLONOSCOPY WITH PROPOFOL;  Surgeon: [name], MD;  Location: [facility] ENDOSCOPY;  Service: Gastroenterology;  Laterality: N/A;   COLONOSCOPY WITH PROPOFOL N/A 12/21/2017   Procedure: COLONOSCOPY WITH PROPOFOL;  Surgeon: [name], MD;  Location[facility] END[facility]COPY;  Service: Gastroenterology;  Laterality: N/A;    ESOPHAGOGASTRODUODENOSCOPY (EGD) WITH PROPOFOL N/A 09/04/2017   Procedure: ESOPHAGOGASTRODUODENOSCOPY (EGD) WITH PROPOFOL;  Surgeon: [name], MD;  Location: [facility] ENDOSCOPY;  Service: Gastroenterology;  Laterality: N/A;   HERNIA REPAIR     HERNIA REPAIR  12/04/2020   TUBAL LIGATION     tummy tuck  12/04/2020   removed 20 masses on stomach   TUMOR REMOVAL     x8    Home Medications:  Allergies as of 11/17/2022       Reactions   Nitrofuran Derivatives Nausea And Vomiting        Medication List        Accurate as of November 17, 2022  8:38 AM. If you have any questions, ask your nurse or doctor.          albuterol 108 (90 Base) MCG/ACT inhaler Commonly known as: VENTOLIN HFA TAKE 2 PUFFS BY MOUTH EVERY 6 HOURS AS NEEDED FOR WHEEZE OR SHORTNESS OF BREATH   fluconazole 150 MG tablet Commonly known as: DIFLUCAN TAKE ONE TAB BY MOUTH EVERY WEEK FOR FUNGAL INFECTION   gabapentin 300 MG capsule Commonly known as: NEURONTIN Take 300 mg by mouth 3 (three) times daily.   ibuprofen 200 MG tablet Commonly known as: ADVIL Take 200 mg by mouth as needed.   metFORMIN 500 MG 24 hr tablet Commonly known as: GLUCOPHAGE-XR Take 1 tablet (500 mg total) by mouth daily with supper.   OxyCONTIN 80 mg 12 hr tablet Generic drug: oxyCODONE Take 80  mg by mouth every 12 (twelve) hours.   Ozempic (0.25 or 0.5 MG/DOSE) 2 MG/3ML Sopn Generic drug: Semaglutide(0.25 or 0.5MG/DOS) INJECT 0.25MG INTO THE SKIN ONCE A WEEK FOR 4 WEEKS THEN INCREASE TO 0.5MG WEEKLY.   pantoprazole 40 MG tablet Commonly known as: PROTONIX TAKE 1 TABLET BY MOUTH TWICE A DAY   SUMAtriptan 100 MG tablet Commonly known as: IMITREX TAKE 1 TABLET BY MOUTH DAILY AS NEEDED FOR MIGRAINE.   tobramycin 0.3 % ophthalmic solution Commonly known as: TOBREX APPLY 1 DROP TO TOENAILS AT BEDTIME        Allergies:  Allergies  Allergen Reactions   Nitrofuran Derivatives Nausea And Vomiting    Family  History: Family History  Problem Relation Age of Onset   Anemia Mother    Lupus Mother    Sjogren's syndrome Mother    Rheum arthritis Mother    Schizophrenia Father    Neurofibromatosis Brother    Neurofibromatosis Son    Neurofibromatosis Son    Neurofibromatosis Daughter    Bladder Cancer Neg Hx    Kidney cancer Neg Hx    Breast cancer Neg Hx    Ovarian cancer Neg Hx    Colon cancer Neg Hx     Social History:  reports that she has quit smoking. Her smoking use included cigarettes. She has never used smokeless tobacco. She reports that she does not currently use drugs. She reports that she does not drink alcohol.  ROS:                                        Physical Exam: LMP 10/06/2018   Constitutional:  Alert and oriented, No acute distress. HEENT: [place] AT, moist mucus membranes.  Trachea midline, no masses.   Laboratory Data: Lab Results  Component Value Date   WBC 6.3 07/23/2022   HGB 14.0 07/23/2022   HCT 43.2 07/23/2022   MCV 87.4 07/23/2022   PLT 270 07/23/2022    Lab Results  Component Value Date   CREATININE 0.85 02/18/2022    No results found for: "PSA"  No results found for: "TESTOSTERONE"  Lab Results  Component Value Date   HGBA1C 5.9 (H) 02/18/2022    Urinalysis    Component Value Date/Time   COLORURINE YELLOW (A) 08/13/2017 1201   APPEARANCEUR Clear 02/11/2021 1011   LABSPEC 1.015 08/13/2017 1201   PHURINE 7.0 08/13/2017 1201   GLUCOSEU Negative 02/11/2021 [place] 08/13/2017 1201   [place] 07/07/2022 0925   BILIRUBINUR Negative 02/11/2021 1011   KETONESUR NEGATIVE 08/13/2017 1201   PROTEINUR Positive (A) 07/07/2022 0925   PROTEINUR Negative 02/11/2021 1011   PROTEINUR NEGATIVE 08/13/2017 1201   UROBILINOGEN 0.2 07/07/2022 0925   NITRITE Positive 07/07/2022 0925   NITRITE Negative 02/11/2021 1011   NITRITE NEGATIVE 08/13/2017 1201   LEUKOCYTESUR Moderate (2+) (A) 07/07/2022 0925    LEUKOCYTESUR Negative 02/11/2021 1011    Pertinent Imaging: Urine reviewed.  Chart reviewed.  Urine sent for culture  Assessment & Plan: Patient has recurrent bladder infections.  I thought it was reasonable to get a CT scan to assess her upper tracts and to clear the lesion from 2019.  She may or may not need an MRI in the future pending the results.  Call if culture positive.  Start patient on urinary prophylaxis trimethoprim 100 mg 30 x 11.  Pathophysiology described.  Follow-up for  cystoscopy.  Called in ciprofloxacin 250 mg twice or 7 days  1. Recurrent UTI  - Urinalysis, Complete   No follow-ups on file.  Reece Packer, MD  [facility] [address], [place], [place] [address] [phone]

## 2022-11-18 ENCOUNTER — Encounter: Payer: Self-pay | Admitting: Dermatology

## 2022-11-18 ENCOUNTER — Ambulatory Visit (INDEPENDENT_AMBULATORY_CARE_PROVIDER_SITE_OTHER): Payer: Self-pay | Admitting: Dermatology

## 2022-11-18 ENCOUNTER — Ambulatory Visit: Admission: RE | Admit: 2022-11-18 | Payer: BC Managed Care – PPO | Source: Ambulatory Visit

## 2022-11-18 VITALS — BP 117/68 | HR 64

## 2022-11-18 DIAGNOSIS — L988 Other specified disorders of the skin and subcutaneous tissue: Secondary | ICD-10-CM

## 2022-11-18 NOTE — Patient Instructions (Signed)
Due to recent changes in healthcare laws, you may see results of your pathology and/or laboratory studies on MyChart before the doctors have had a chance to review them. We understand that in some cases there may be results that are confusing or concerning to you. Please understand that not all results are received at the same time and often the doctors may need to interpret multiple results in order to provide you with the best plan of care or course of treatment. Therefore, we ask that you please give us 2 business days to thoroughly review all your results before contacting the office for clarification. Should we see a critical lab result, you will be contacted sooner.   If You Need Anything After Your Visit  If you have any questions or concerns for your doctor, please call our main line at [phone] and press option 4 to reach your doctor's medical assistant. If no one answers, please leave a voicemail as directed and we will return your call as soon as possible. Messages left after 4 pm will be answered the following business day.   You may also send us a message via MyChart. We typically respond to MyChart messages within 1-2 business days.  For prescription refills, please ask your pharmacy to contact our office. Our fax number is [phone].  If you have an urgent issue when the clinic is closed that cannot wait until the next business day, you can page your doctor at the number below.    Please note that while we do our best to be available for urgent issues outside of office hours, we are not available 24/7.   If you have an urgent issue and are unable to reach us, you may choose to seek medical care at your doctor's office, retail clinic, urgent care center, or emergency room.  If you have a medical emergency, please immediately call 911 or go to the emergency department.  Pager Numbers  - Dr. Kowalski: [phone]  - Dr. Moye: [phone]  - Dr. Stewart:  [phone]  In the event of inclement weather, please call our main line at [phone] for an update on the status of any delays or closures.  Dermatology Medication Tips: Please keep the boxes that topical medications come in in order to help keep track of the instructions about where and how to use these. Pharmacies typically print the medication instructions only on the boxes and not directly on the medication tubes.   If your medication is too expensive, please contact our office at [phone] option 4 or send us a message through MyChart.   We are unable to tell what your co-pay for medications will be in advance as this is different depending on your insurance coverage. However, we may be able to find a substitute medication at lower cost or fill out paperwork to get insurance to cover a needed medication.   If a prior authorization is required to get your medication covered by your insurance company, please allow us 1-2 business days to complete this process.  Drug prices often vary depending on where the prescription is filled and some pharmacies may offer cheaper prices.  The website www.goodrx.com contains coupons for medications through different pharmacies. The prices here do not account for what the cost may be with help from insurance (it may be cheaper with your insurance), but the website can give you the price if you did not use any insurance.  - You can print the associated coupon and take it with   your prescription to the pharmacy.  - You may also stop by our office during regular business hours and pick up a GoodRx coupon card.  - If you need your prescription sent electronically to a different pharmacy, notify our office through [place] MyChart or by phone at [phone] option 4.     Si Usted Necesita Algo Despus de Su Visita  Tambin puede enviarnos un mensaje a travs de MyChart. Por lo general respondemos a los mensajes de MyChart en el transcurso de 1 a 2  das hbiles.  Para renovar recetas, por favor pida a su farmacia que se ponga en contacto con nuestra oficina. Nuestro nmero de fax es el [phone].  Si tiene un asunto urgente cuando la clnica est cerrada y que no puede esperar hasta el siguiente da hbil, puede llamar/localizar a su doctor(a) al nmero que aparece a continuacin.   Por favor, tenga en cuenta que aunque hacemos todo lo posible para estar disponibles para asuntos urgentes fuera del horario de oficina, no estamos disponibles las 24 horas del da, los 7 das de la semana.   Si tiene un problema urgente y no puede comunicarse con nosotros, puede optar por buscar atencin mdica  en el consultorio de su doctor(a), en una clnica privada, en un centro de atencin urgente o en una sala de emergencias.  Si tiene una emergencia mdica, por favor llame inmediatamente al 911 o vaya a la sala de emergencias.  Nmeros de bper  - Dr. Kowalski: [phone]  - Dra. Moye: [phone]  - Dra. Stewart: [phone]  En caso de inclemencias del tiempo, por favor llame a nuestra lnea principal al [phone] para una actualizacin sobre el estado de cualquier retraso o cierre.  Consejos para la medicacin en dermatologa: Por favor, guarde las cajas en las que vienen los medicamentos de uso tpico para ayudarle a seguir las instrucciones sobre dnde y cmo usarlos. Las farmacias generalmente imprimen las instrucciones del medicamento slo en las cajas y no directamente en los tubos del medicamento.   Si su medicamento es muy caro, por favor, pngase en contacto con nuestra oficina llamando al [phone] y presione la opcin 4 o envenos un mensaje a travs de MyChart.   No podemos decirle cul ser su copago por los medicamentos por adelantado ya que esto es diferente dependiendo de la cobertura de su seguro. Sin embargo, es posible que podamos encontrar un medicamento sustituto a menor costo o llenar un formulario para que el  seguro cubra el medicamento que se considera necesario.   Si se requiere una autorizacin previa para que su compaa de seguros cubra su medicamento, por favor permtanos de 1 a 2 das hbiles para completar este proceso.  Los precios de los medicamentos varan con frecuencia dependiendo del lugar de dnde se surte la receta y alguna farmacias pueden ofrecer precios ms baratos.  El sitio web www.goodrx.com tiene cupones para medicamentos de diferentes farmacias. Los precios aqu no tienen en cuenta lo que podra costar con la ayuda del seguro (puede ser ms barato con su seguro), pero el sitio web puede darle el precio si no utiliz ningn seguro.  - Puede imprimir el cupn correspondiente y llevarlo con su receta a la farmacia.  - Tambin puede pasar por nuestra oficina durante el horario de atencin regular y recoger una tarjeta de cupones de GoodRx.  - Si necesita que su receta se enve electrnicamente a una farmacia diferente, informe a nuestra oficina a travs de MyChart de [place]   o por telfono llamando al [phone] y presione la opcin 4.  

## 2022-11-18 NOTE — Progress Notes (Signed)
   Follow-Up Visit   Subjective  Andrea Bernard is a 57 y.o. female who presents for the following: Facial Elastosis (Here for fillers. Is concerned with bruising still at left cheek. Feels numbness at left cheek when smiling).  The following portions of the chart were reviewed this encounter and updated as appropriate:  Tobacco  Allergies  Meds  Problems  Med Hx  Surg Hx  Fam Hx      Review of Systems: No other skin or systemic complaints except as noted in HPI or Assessment and Plan.   Objective  Well appearing patient in no apparent distress; mood and affect are within normal limits.  A focused examination was performed including face. Relevant physical exam findings are noted in the Assessment and Plan.  Head - Anterior (Face) Mild edema and bruising at left cheek   Assessment & Plan  Elastosis of skin Head - Anterior (Face)  Advised mild numbness at left cheek related to irritation/disruption of nerve, should continue improving and resolve with time.   Will wait and do Voluma 2 more syringes to lateral mid-face and Volux (training case for Volux, no charge) after upcoming snow tubing trip.    Return for [facility] as scheduled.  Documentation: I have reviewed the above documentation for accuracy and completeness, and I agree with the above.  Forest Gleason, MD

## 2022-11-20 ENCOUNTER — Telehealth: Payer: Self-pay

## 2022-11-20 NOTE — Telephone Encounter (Signed)
Patient advised. Patient states it always shows negative but then grows infection. Advised we have to wait for final results. Patient states she was scheduled for CT scan but then received a call later in the day before her appointment stating she needs to drink 32 oz of liquid and hold it for the scan. Patient states she can not do that right now until she gets her UTI cleared and will call them back to re schedule as soon as she is better.

## 2022-11-20 NOTE — Telephone Encounter (Signed)
Patient left voicemail on triage line stating she started Cipro on 11/17/22 when she saw Dr Matilde Sprang, and has been "sick as a dog." Vomiting and nausea is present and is not able to keep taking this medication. She is requesting a different antibiotic.

## 2022-11-21 LAB — CULTURE, URINE COMPREHENSIVE

## 2022-11-24 ENCOUNTER — Telehealth: Payer: Self-pay

## 2022-11-24 NOTE — Telephone Encounter (Signed)
Patient called asking about urine culture. This has not been reviewed yet and I advised patient we will call her back with results.  Patient states she is still having issues with pain with urination, cloudy urine, a lot of discomfort.

## 2022-11-24 NOTE — Telephone Encounter (Signed)
Spoke with patient and advised that culture was negative, pt states she will call her PCP to see what is going on. Pt also states she can't drink 32 oz of water in order to do the CT scan, pt canceled the order.

## 2022-11-30 ENCOUNTER — Other Ambulatory Visit: Payer: Self-pay | Admitting: Physician Assistant

## 2022-11-30 DIAGNOSIS — R300 Dysuria: Secondary | ICD-10-CM

## 2022-12-02 ENCOUNTER — Ambulatory Visit (INDEPENDENT_AMBULATORY_CARE_PROVIDER_SITE_OTHER): Payer: Self-pay | Admitting: Dermatology

## 2022-12-02 ENCOUNTER — Encounter: Payer: Self-pay | Admitting: Dermatology

## 2022-12-02 VITALS — BP 106/61 | HR 59

## 2022-12-02 DIAGNOSIS — L988 Other specified disorders of the skin and subcutaneous tissue: Secondary | ICD-10-CM

## 2022-12-02 NOTE — Patient Instructions (Addendum)
Pager for Dr. Laurence Ferrari: [phone]   Due to recent changes in healthcare laws, you may see results of your pathology and/or laboratory studies on MyChart before the doctors have had a chance to review them. We understand that in some cases there may be results that are confusing or concerning to you. Please understand that not all results are received at the same time and often the doctors may need to interpret multiple results in order to provide you with the best plan of care or course of treatment. Therefore, we ask that you please give [place] 2 business days to thoroughly review all your results before contacting the office for clarification. Should we see a critical lab result, you will be contacted sooner.   If You Need Anything After Your Visit  If you have any questions or concerns for your doctor, please call our main line at [phone] and press option 4 to reach your doctor's medical assistant. If no one answers, please leave a voicemail as directed and we will return your call as soon as possible. Messages left after 4 pm will be answered the following business day.   You may also send [place] a message via [place]. We typically respond to MyChart messages within 1-2 business days.  For prescription refills, please ask your pharmacy to contact our office. Our fax number is [phone].  If you have an urgent issue when the clinic is closed that cannot wait until the next business day, you can page your doctor at the number below.    Please note that while we do our best to be available for urgent issues outside of office hours, we are not available 24/7.   If you have an urgent issue and are unable to reach [place], you may choose to seek medical care at your doctor's office, retail clinic, urgent care center, or emergency room.  If you have a medical emergency, please immediately call 911 or go to the emergency department.  Pager Numbers  - Dr. Nehemiah Massed: [phone]  - Dr. Laurence Ferrari:  [phone]  - Dr. Nicole Kindred: [phone]  In the event of inclement weather, please call our main line at [phone] for an update on the status of any delays or closures.  Dermatology Medication Tips: Please keep the boxes that topical medications come in in order to help keep track of the instructions about where and how to use these. Pharmacies typically print the medication instructions only on the boxes and not directly on the medication tubes.   If your medication is too expensive, please contact our office at [phone] option 4 or send [place] a message through [place].   We are unable to tell what your co-pay for medications will be in advance as this is different depending on your insurance coverage. However, we may be able to find a substitute medication at lower cost or fill out paperwork to get insurance to cover a needed medication.   If a prior authorization is required to get your medication covered by your insurance company, please allow [place] 1-2 business days to complete this process.  Drug prices often vary depending on where the prescription is filled and some pharmacies may offer cheaper prices.  The website www.goodrx.com contains coupons for medications through different pharmacies. The prices here do not account for what the cost may be with help from insurance (it may be cheaper with your insurance), but the website can give you the price if you did not use any insurance.  - You  can print the associated coupon and take it with your prescription to the pharmacy.  - You may also stop by our office during regular business hours and pick up a GoodRx coupon card.  - If you need your prescription sent electronically to a different pharmacy, notify our office through [facility] or by phone at [phone] option 4.     Si Usted Necesita Algo Despus de Su Visita  Tambin puede enviarnos un mensaje a travs de Pharmacist, community. Por lo general respondemos a los mensajes de  MyChart en el transcurso de 1 a 2 das hbiles.  Para renovar recetas, por favor pida a su farmacia que se ponga en contacto con nuestra oficina. Harland Dingwall de fax es Anton Ruiz [phone].  Si tiene un asunto urgente cuando la clnica est cerrada y que no puede esperar hasta el siguiente da hbil, puede llamar/localizar a su doctor(a) al nmero que aparece a continuacin.   Por favor, tenga en cuenta que aunque hacemos todo lo posible para estar disponibles para asuntos urgentes fuera del horario de [place], no estamos disponibles las 24 horas del da, los 7 das de la [place].   Si tiene un problema urgente y no puede comunicarse con nosotros, puede optar por buscar atencin mdica  en el consultorio de su doctor(a), en una clnica privada, en un centro de atencin urgente o en una sala de emergencias.  Si tiene Engineering geologist, por favor llame inmediatamente al 911 o vaya a la sala de emergencias.  Nmeros de bper  - Dr. Nehemiah Massed: [phone]  - Dra. Moye: [phone]  - Dra. Nicole Kindred: [phone]  En caso de inclemencias del [place], por favor llame a Johnsie Kindred principal al [phone] para una actualizacin sobre el [place] de cualquier retraso o cierre.  Consejos para la medicacin en dermatologa: Por favor, guarde las cajas en las que vienen los medicamentos de uso tpico para ayudarle a seguir las instrucciones sobre dnde y cmo usarlos. Las farmacias generalmente imprimen las instrucciones del medicamento slo en las cajas y no directamente en los tubos del [place].   Si su medicamento es muy caro, por favor, pngase en contacto con Zigmund Daniel llamando al [phone] y presione la opcin 4 o envenos un mensaje a travs de Pharmacist, community.   No podemos decirle cul ser su copago por los medicamentos por adelantado ya que esto es diferente dependiendo de la cobertura de su seguro. Sin embargo, es posible que podamos encontrar un medicamento sustituto a Contractor un formulario para que el seguro cubra el medicamento que se considera necesario.   Si se requiere una autorizacin previa para que su compaa de seguros [place] su medicamento, por favor permtanos de 1 a 2 das hbiles para completar este proceso.  Los precios de los medicamentos varan con frecuencia dependiendo del Environmental consultant de dnde se surte la receta y alguna farmacias pueden ofrecer precios ms baratos.  El sitio web www.goodrx.com tiene cupones para medicamentos de Airline pilot. Los precios aqu no tienen en cuenta lo que podra costar con la ayuda del seguro (puede ser ms barato con su seguro), pero el sitio web puede darle el precio si no utiliz Research scientist (physical sciences).  - Puede imprimir el cupn correspondiente y llevarlo con su receta a la farmacia.  - Tambin puede pasar por nuestra oficina durante el horario de atencin regular y Charity fundraiser una tarjeta de cupones de GoodRx.  - Si necesita que su receta se enve electrnicamente a una farmacia diferente, informe a  nuestra oficina a travs de MyChart de [place] o por telfono llamando al [phone] y presione la opcin 4.

## 2022-12-02 NOTE — Progress Notes (Signed)
   Follow-Up Visit   Subjective  Andrea Bernard is a 57 y.o. female who presents for the following: Facial Elastosis (Here for fillers).   The following portions of the chart were reviewed this encounter and updated as appropriate:  Tobacco  Allergies  Meds  Problems  Med Hx  Surg Hx  Fam Hx      Review of Systems: No other skin or systemic complaints except as noted in HPI or Assessment and Plan.   Objective  Well appearing patient in no apparent distress; mood and affect are within normal limits.  A focused examination was performed including face. Relevant physical exam findings are noted in the Assessment and Plan.  face Rhytides and volume loss.                                       Assessment & Plan  Elastosis of skin face  2 syringes voluma to be paid  Volux 4 syringes plus voluma 2 syringes used for training, no charge  Filling material injection - face Prior to the procedure, the patient's past medical history, allergies and the rare but potential risks and complications were reviewed with the patient and a signed consent was obtained. Pre and post-treatment care was discussed and instructions provided.  Location: cheeks, jaw, chin  Filler Type: Juvederm, Voluma  Procedure: The area was prepped thoroughly with Puracyn. After introducing the needle into the desired treatment area, the syringe plunger was drawn back to ensure there was no flash of blood prior to injecting the filler in order to minimize risk of intravascular injection and vascular occlusion. A cannula was used to inject the filler. Insertion sites were prepped with puracyn and lidocaine was injected to achieve good local anesthesia. A 27-gauge needle was used to create an opening at the insertion site and then the cannula was inserted using sterile technique. Prior to injecting filler at the desired location, the syringe plunger was drawn back to ensure there was  no flash of blood in order to minimize risk of intravascular injection and vascular occlusion.  After injection of the filler, the treated areas were cleansed and iced to reduce swelling. Post-treatment instructions were reviewed with the patient.       Patient tolerated the procedure well. The patient will call with any problems, questions or concerns prior to their next appointment.    No follow-ups on file.  I, Emelia Salisbury, CMA, am acting as scribe for Forest Gleason, MD.  Documentation: I have reviewed the above documentation for accuracy and completeness, and I agree with the above.  Forest Gleason, MD

## 2022-12-11 ENCOUNTER — Encounter: Payer: Self-pay | Admitting: Physician Assistant

## 2022-12-11 ENCOUNTER — Ambulatory Visit (INDEPENDENT_AMBULATORY_CARE_PROVIDER_SITE_OTHER): Payer: BC Managed Care – PPO | Admitting: Physician Assistant

## 2022-12-11 VITALS — BP 99/75 | HR 68 | Temp 97.8°F | Resp 16 | Ht 64.0 in | Wt 146.6 lb

## 2022-12-11 DIAGNOSIS — D509 Iron deficiency anemia, unspecified: Secondary | ICD-10-CM

## 2022-12-11 DIAGNOSIS — R7303 Prediabetes: Secondary | ICD-10-CM

## 2022-12-11 DIAGNOSIS — E559 Vitamin D deficiency, unspecified: Secondary | ICD-10-CM | POA: Diagnosis not present

## 2022-12-11 DIAGNOSIS — R5383 Other fatigue: Secondary | ICD-10-CM

## 2022-12-11 DIAGNOSIS — B379 Candidiasis, unspecified: Secondary | ICD-10-CM

## 2022-12-11 DIAGNOSIS — E7800 Pure hypercholesterolemia, unspecified: Secondary | ICD-10-CM | POA: Diagnosis not present

## 2022-12-11 DIAGNOSIS — E538 Deficiency of other specified B group vitamins: Secondary | ICD-10-CM

## 2022-12-11 LAB — POCT GLYCOSYLATED HEMOGLOBIN (HGB A1C): Hemoglobin A1C: 5.5 % (ref 4.0–5.6)

## 2022-12-11 MED ORDER — FLUCONAZOLE 150 MG PO TABS: ORAL_TABLET | ORAL | 3 refills | Status: DC

## 2022-12-11 NOTE — Progress Notes (Signed)
[facility] [place], [place] [address]  Internal MEDICINE  Office Visit Note  Patient Name: Andrea Bernard  [id number]  [id number]  Date of Service: 12/11/2022  Chief Complaint  Patient presents with   Follow-up   Gastroesophageal Reflux    HPI Pt is here for routine follow up -Has seen urology now, had a bad UTI at that time and was given cipro but this made her sick and she called back but culture was negative at that point. She did take some left over amoxicillin and this cleared it up. She had taken a few tabs before culture was taken and may have impacted results. Unfortunately she had to cancel CT scan due to inability to hold urine for scan due to UTI. She is rescheduling this now. -She is doing well on ozempic and finds that her pain is improved on this as well, feels like it helps her inflammation and just overall feels better since being  on this -She is on metformin as well from her dermatologist to help with adiposis dolorosa and is doing well with this as well -Labs for CPE ordered  Current Medication: Outpatient Encounter Medications as of 12/11/2022  Medication Sig   albuterol (VENTOLIN HFA) 108 (90 Base) MCG/ACT inhaler TAKE 2 PUFFS BY MOUTH EVERY 6 HOURS AS NEEDED FOR WHEEZE OR SHORTNESS OF BREATH   gabapentin (NEURONTIN) 300 MG capsule Take 300 mg by mouth 3 (three) times daily.   ibuprofen (ADVIL) 200 MG tablet Take 200 mg by mouth as needed.   metFORMIN (GLUCOPHAGE-XR) 500 MG 24 hr tablet Take 1 tablet (500 mg total) by mouth daily with supper.   OXYCONTIN 80 MG 12 hr tablet Take 80 mg by mouth every 12 (twelve) hours.   OZEMPIC, 0.25 OR 0.5 MG/DOSE, 2 MG/3ML SOPN INJECT 0.'25MG'$  INTO THE SKIN ONCE A WEEK FOR 4 WEEKS THEN INCREASE TO 0.'5MG'$  WEEKLY.   pantoprazole (PROTONIX) 40 MG tablet TAKE 1 TABLET BY MOUTH TWICE A DAY   SUMAtriptan (IMITREX) 100 MG tablet TAKE 1 TABLET BY MOUTH DAILY AS NEEDED FOR MIGRAINE.   tobramycin (TOBREX) 0.3 %  ophthalmic solution APPLY 1 DROP TO TOENAILS AT BEDTIME   trimethoprim (TRIMPEX) 100 MG tablet Take 1 tablet (100 mg total) by mouth daily.   [DISCONTINUED] fluconazole (DIFLUCAN) 150 MG tablet TAKE ONE TAB BY MOUTH EVERY WEEK FOR FUNGAL INFECTION   fluconazole (DIFLUCAN) 150 MG tablet Take 1 tablet by mouth for yeast infection. May repeat in 3 days if not resolved.   Facility-Administered Encounter Medications as of 12/11/2022  Medication   iron sucrose (VENOFER) 200 mg IVPB   sodium chloride flush (NS) 0.9 % injection 10 mL    Surgical History: Past Surgical History:  Procedure Laterality Date   AUGMENTATION MAMMAPLASTY Bilateral 12/10/2020   BREAST ENHANCEMENT SURGERY Bilateral 12/04/2020   carpel tunnel release Bilateral    CESAREAN SECTION     x 4   COLONOSCOPY WITH PROPOFOL N/A 09/04/2017   Procedure: COLONOSCOPY WITH PROPOFOL;  Surgeon: [name], MD;  Location: [facility] ENDOSCOPY;  Service: Gastroenterology;  Laterality: N/A;   COLONOSCOPY WITH PROPOFOL N/A 12/21/2017   Procedure: COLONOSCOPY WITH PROPOFOL;  Surgeon: [name], MD;  Location[facility] ENDOSCOPY;  Service: Gastroenterology;  Laterality: N/A;   ESOPHAGOGASTRODUODENOSCOPY (EGD) WITH PROPOFOL N/A 09/04/2017   Procedure: ESOPHAGOGASTRODUODENOSCOPY (EGD) WITH PROPOFOL;  Surgeon: [name], MD;  Location: [facility] ENDOSCOPY;  Service: Gastroenterology;  Laterality: N/A;   HERNIA REPAIR     HERNIA REPAIR  12/04/2020   TUBAL LIGATION     tummy tuck  12/04/2020   removed 20 masses on stomach   TUMOR REMOVAL     x8    Medical History: Past Medical History:  Diagnosis Date   Arthritis    Dysplastic nevus 03/02/2008   Right lateral thigh. Moderate atypia, limited margins free.   GERD (gastroesophageal reflux disease)    Headache    Iron deficiency anemia 06/15/2017   Neurofibromatosis ([place])     Family History: Family History  Problem Relation Age of Onset   Anemia Mother    Lupus Mother    Sjogren's syndrome  Mother    Rheum arthritis Mother    Schizophrenia Father    Neurofibromatosis Brother    Neurofibromatosis Son    Neurofibromatosis Son    Neurofibromatosis Daughter    Bladder Cancer Neg Hx    Kidney cancer Neg Hx    Breast cancer Neg Hx    Ovarian cancer Neg Hx    Colon cancer Neg Hx     Social History   Socioeconomic History   Marital status: Married    Spouse name: Not on file   Number of children: Not on file   Years of education: Not on file   Highest education level: Not on file  Occupational History   Not on file  Tobacco Use   Smoking status: Former    Types: Cigarettes   Smokeless tobacco: Never   Tobacco comments:    Age [age]  Vaping Use   Vaping Use: Never used  Substance and Sexual Activity   Alcohol use: No   Drug use: Not Currently   Sexual activity: Yes    Birth control/protection: Surgical  Other Topics Concern   Not on file  Social History Narrative   Not on file   Social Determinants of Health   Financial Resource Strain: Not on file  Food Insecurity: Not on file  Transportation Needs: Not on file  Physical Activity: Not on file  Stress: Not on file  Social Connections: Not on file  Intimate Partner Violence: Not on file      Review of Systems  Constitutional:  Negative for chills, fatigue and unexpected weight change.  HENT:  Negative for congestion, rhinorrhea, sneezing and sore throat.   Eyes:  Negative for redness.  Respiratory:  Negative for cough, chest tightness and shortness of breath.   Cardiovascular:  Negative for chest pain and palpitations.  Gastrointestinal:  Negative for abdominal pain, constipation, diarrhea, nausea and vomiting.  Genitourinary:  Negative for dysuria and frequency.  Musculoskeletal:  Positive for arthralgias. Negative for back pain, joint swelling and neck pain.  Skin:  Negative for rash.  Neurological: Negative.  Negative for tremors and numbness.  Hematological:  Negative for adenopathy. Does not  bruise/bleed easily.  Psychiatric/Behavioral:  Negative for behavioral problems (Depression), sleep disturbance and suicidal ideas. The patient is not nervous/anxious.     Vital Signs: BP 99/75   Pulse 68   Temp 97.8 F (36.6 C)   Resp 16   Ht '5\' 4"'$  (1.626 m)   Wt 146 lb 9.6 oz (66.5 kg)   LMP 10/06/2018   SpO2 96%   BMI 25.16 kg/m    Physical Exam Vitals and nursing note reviewed.  Constitutional:      General: She is not in acute distress.    Appearance: She is well-developed. She is not diaphoretic.  HENT:     Head: Normocephalic and atraumatic.  Mouth/Throat:     Pharynx: No oropharyngeal exudate.  Eyes:     Pupils: Pupils are equal, round, and reactive to light.  Neck:     Thyroid: No thyromegaly.     Vascular: No JVD.     Trachea: No tracheal deviation.  Cardiovascular:     Rate and Rhythm: Normal rate and regular rhythm.     Heart sounds: Normal heart sounds. No murmur heard.    No friction rub. No gallop.  Pulmonary:     Effort: Pulmonary effort is normal. No respiratory distress.     Breath sounds: No wheezing or rales.  Chest:     Chest wall: No tenderness.  Breasts:    Right: Normal. No mass.     Left: No mass.  Abdominal:     General: Bowel sounds are normal.     Palpations: Abdomen is soft.  Musculoskeletal:        General: Normal range of motion.     Cervical back: Normal range of motion and neck supple.  Lymphadenopathy:     Cervical: No cervical adenopathy.  Skin:    General: Skin is warm and dry.  Neurological:     Mental Status: She is alert and oriented to person, place, and time.     Cranial Nerves: No cranial nerve deficit.  Psychiatric:        Behavior: Behavior normal.        Thought Content: Thought content normal.        Judgment: Judgment normal.        Assessment/Plan: 1. Prediabetes - POCT HgB A1C is 5.5 and improved from 5.9 since last check. She is well controlled on ozempic and metformin and will continue -  Comprehensive metabolic panel  2. Iron deficiency anemia, unspecified iron deficiency anemia type Will update labs - Fe+TIBC+Fer  3. Hypercholesterolemia - Lipid Panel With LDL/HDL Ratio  4. Vitamin D deficiency - VITAMIN D 25 Hydroxy (Vit-D Deficiency, Fractures)  5. Vitamin B12 deficiency - B12 and Folate Panel  6. Yeast infection - fluconazole (DIFLUCAN) 150 MG tablet; Take 1 tablet by mouth for yeast infection. May repeat in 3 days if not resolved.  Dispense: 4 tablet; Refill: 3  7. Other fatigue - CBC w/Diff/Platelet - Comprehensive metabolic panel - TSH + free T4 - Lipid Panel With LDL/HDL Ratio - Fe+TIBC+Fer - VITAMIN D 25 Hydroxy (Vit-D Deficiency, Fractures) - B12 and Folate Panel    General Counseling: Alaza verbalizes understanding of the findings of todays visit and agrees with plan of treatment. I have discussed any further diagnostic evaluation that may be needed or ordered today. We also reviewed her medications today. she has been encouraged to call the office with any questions or concerns that should arise related to todays visit.    Orders Placed This Encounter  Procedures   CBC w/Diff/Platelet   Comprehensive metabolic panel   TSH + free T4   Lipid Panel With LDL/HDL Ratio   Fe+TIBC+Fer   VITAMIN D 25 Hydroxy (Vit-D Deficiency, Fractures)   B12 and Folate Panel   POCT HgB A1C    Meds ordered this encounter  Medications   fluconazole (DIFLUCAN) 150 MG tablet    Sig: Take 1 tablet by mouth for yeast infection. May repeat in 3 days if not resolved.    Dispense:  4 tablet    Refill:  3    This patient was seen by Drema Dallas, PA-C in collaboration with Dr. Clayborn Bigness as a part of  collaborative care agreement.   Total time spent:30 Minutes Time spent includes review of chart, medications, test results, and follow up plan with the patient.      Dr [name] Internal medicine

## 2022-12-24 ENCOUNTER — Ambulatory Visit: Admission: RE | Admit: 2022-12-24 | Payer: BC Managed Care – PPO | Source: Ambulatory Visit

## 2022-12-31 ENCOUNTER — Other Ambulatory Visit: Payer: Self-pay | Admitting: Physician Assistant

## 2022-12-31 ENCOUNTER — Other Ambulatory Visit: Payer: Self-pay | Admitting: Dermatology

## 2022-12-31 DIAGNOSIS — E882 Lipomatosis, not elsewhere classified: Secondary | ICD-10-CM

## 2022-12-31 DIAGNOSIS — R7309 Other abnormal glucose: Secondary | ICD-10-CM

## 2023-01-01 ENCOUNTER — Ambulatory Visit
Admission: RE | Admit: 2023-01-01 | Discharge: 2023-01-01 | Disposition: A | Discharge status: Home / Self Care | Payer: BC Managed Care – PPO | Source: Ambulatory Visit | Attending: Urology | Admitting: Urology

## 2023-01-01 ENCOUNTER — Ambulatory Visit (INDEPENDENT_AMBULATORY_CARE_PROVIDER_SITE_OTHER): Payer: BC Managed Care – PPO | Admitting: Dermatology

## 2023-01-01 ENCOUNTER — Other Ambulatory Visit: Payer: Self-pay | Admitting: Internal Medicine

## 2023-01-01 ENCOUNTER — Encounter: Payer: Self-pay | Admitting: Dermatology

## 2023-01-01 DIAGNOSIS — E7800 Pure hypercholesterolemia, unspecified: Secondary | ICD-10-CM

## 2023-01-01 DIAGNOSIS — D509 Iron deficiency anemia, unspecified: Secondary | ICD-10-CM

## 2023-01-01 DIAGNOSIS — Z0001 Encounter for general adult medical examination with abnormal findings: Secondary | ICD-10-CM

## 2023-01-01 DIAGNOSIS — K219 Gastro-esophageal reflux disease without esophagitis: Secondary | ICD-10-CM

## 2023-01-01 DIAGNOSIS — L601 Onycholysis: Secondary | ICD-10-CM

## 2023-01-01 DIAGNOSIS — N644 Mastodynia: Secondary | ICD-10-CM

## 2023-01-01 DIAGNOSIS — E882 Lipomatosis, not elsewhere classified: Secondary | ICD-10-CM | POA: Diagnosis not present

## 2023-01-01 DIAGNOSIS — Q8500 Neurofibromatosis, unspecified: Secondary | ICD-10-CM

## 2023-01-01 DIAGNOSIS — N2889 Other specified disorders of kidney and ureter: Secondary | ICD-10-CM | POA: Diagnosis present

## 2023-01-01 DIAGNOSIS — G894 Chronic pain syndrome: Secondary | ICD-10-CM

## 2023-01-01 DIAGNOSIS — N281 Cyst of kidney, acquired: Secondary | ICD-10-CM | POA: Diagnosis present

## 2023-01-01 DIAGNOSIS — N390 Urinary tract infection, site not specified: Secondary | ICD-10-CM | POA: Diagnosis present

## 2023-01-01 DIAGNOSIS — G43909 Migraine, unspecified, not intractable, without status migrainosus: Secondary | ICD-10-CM

## 2023-01-01 DIAGNOSIS — Z9882 Breast implant status: Secondary | ICD-10-CM

## 2023-01-01 DIAGNOSIS — R300 Dysuria: Secondary | ICD-10-CM

## 2023-01-01 MED ORDER — IOHEXOL 300 MG/ML  SOLN
100.0000 mL | Freq: Once | INTRAMUSCULAR | Status: AC | PRN
  Administered 2023-01-01: 100 mL via INTRAVENOUS

## 2023-01-01 NOTE — Progress Notes (Signed)
   Follow-Up Visit   Subjective  Andrea Bernard is a 57 y.o. female who presents for the following: Adiposis dolorosa. Patient currently taking metformin 500 mg once daily. Patient advises she feels better without pain of lipomas on metformin and defers ILK injections or excisions.   Patient scheduled for CT scan today and MRI next week to evaluate a mass on her kidney.   The following portions of the chart were reviewed this encounter and updated as appropriate: medications, allergies, medical history  Review of Systems:  No other skin or systemic complaints except as noted in HPI or Assessment and Plan.  Objective  Well appearing patient in no apparent distress; mood and affect are within normal limits.  A focused examination was performed of the following areas: Face, arms  Relevant exam findings are noted in the Assessment and Plan.    Assessment & Plan   Adiposis dolorosa Scattered at body   Chronic condition with duration or expected duration over one year. Currently well-controlled.  Treatment Plan: Continue metformin 500 mg extended release daily with evening meal. Advised if she starts trimethoprim for urinary issues, this can increase the effect of the metformin, but as she is on the lowest dose of metformin this likely would not be an issue.    Reviewed risk of nausea and GI side effects with metformin.   Return for TBSE, as scheduled.  Graciella Belton, RMA, am acting as scribe for Forest Gleason, MD .   Documentation: I have reviewed the above documentation for accuracy and completeness, and I agree with the above.  Forest Gleason, MD

## 2023-01-01 NOTE — Patient Instructions (Signed)
Due to recent changes in healthcare laws, you may see results of your pathology and/or laboratory studies on MyChart before the doctors have had a chance to review them. We understand that in some cases there may be results that are confusing or concerning to you. Please understand that not all results are received at the same time and often the doctors may need to interpret multiple results in order to provide you with the best plan of care or course of treatment. Therefore, we ask that you please give us 2 business days to thoroughly review all your results before contacting the office for clarification. Should we see a critical lab result, you will be contacted sooner.   If You Need Anything After Your Visit  If you have any questions or concerns for your doctor, please call our main line at [phone] and press option 4 to reach your doctor's medical assistant. If no one answers, please leave a voicemail as directed and we will return your call as soon as possible. Messages left after 4 pm will be answered the following business day.   You may also send us a message via MyChart. We typically respond to MyChart messages within 1-2 business days.  For prescription refills, please ask your pharmacy to contact our office. Our fax number is [phone].  If you have an urgent issue when the clinic is closed that cannot wait until the next business day, you can page your doctor at the number below.    Please note that while we do our best to be available for urgent issues outside of office hours, we are not available 24/7.   If you have an urgent issue and are unable to reach us, you may choose to seek medical care at your doctor's office, retail clinic, urgent care center, or emergency room.  If you have a medical emergency, please immediately call 911 or go to the emergency department.  Pager Numbers  - Dr. Kowalski: [phone]  - Dr. Moye: [phone]  - Dr. Stewart:  [phone]  In the event of inclement weather, please call our main line at [phone] for an update on the status of any delays or closures.  Dermatology Medication Tips: Please keep the boxes that topical medications come in in order to help keep track of the instructions about where and how to use these. Pharmacies typically print the medication instructions only on the boxes and not directly on the medication tubes.   If your medication is too expensive, please contact our office at [phone] option 4 or send us a message through MyChart.   We are unable to tell what your co-pay for medications will be in advance as this is different depending on your insurance coverage. However, we may be able to find a substitute medication at lower cost or fill out paperwork to get insurance to cover a needed medication.   If a prior authorization is required to get your medication covered by your insurance company, please allow us 1-2 business days to complete this process.  Drug prices often vary depending on where the prescription is filled and some pharmacies may offer cheaper prices.  The website www.goodrx.com contains coupons for medications through different pharmacies. The prices here do not account for what the cost may be with help from insurance (it may be cheaper with your insurance), but the website can give you the price if you did not use any insurance.  - You can print the associated coupon and take it with   your prescription to the pharmacy.  - You may also stop by our office during regular business hours and pick up a GoodRx coupon card.  - If you need your prescription sent electronically to a different pharmacy, notify our office through [place] MyChart or by phone at [phone] option 4.     Si Usted Necesita Algo Despus de Su Visita  Tambin puede enviarnos un mensaje a travs de MyChart. Por lo general respondemos a los mensajes de MyChart en el transcurso de 1 a 2  das hbiles.  Para renovar recetas, por favor pida a su farmacia que se ponga en contacto con nuestra oficina. Nuestro nmero de fax es el [phone].  Si tiene un asunto urgente cuando la clnica est cerrada y que no puede esperar hasta el siguiente da hbil, puede llamar/localizar a su doctor(a) al nmero que aparece a continuacin.   Por favor, tenga en cuenta que aunque hacemos todo lo posible para estar disponibles para asuntos urgentes fuera del horario de oficina, no estamos disponibles las 24 horas del da, los 7 das de la semana.   Si tiene un problema urgente y no puede comunicarse con nosotros, puede optar por buscar atencin mdica  en el consultorio de su doctor(a), en una clnica privada, en un centro de atencin urgente o en una sala de emergencias.  Si tiene una emergencia mdica, por favor llame inmediatamente al 911 o vaya a la sala de emergencias.  Nmeros de bper  - Dr. Kowalski: [phone]  - Dra. Moye: [phone]  - Dra. Stewart: [phone]  En caso de inclemencias del tiempo, por favor llame a nuestra lnea principal al [phone] para una actualizacin sobre el estado de cualquier retraso o cierre.  Consejos para la medicacin en dermatologa: Por favor, guarde las cajas en las que vienen los medicamentos de uso tpico para ayudarle a seguir las instrucciones sobre dnde y cmo usarlos. Las farmacias generalmente imprimen las instrucciones del medicamento slo en las cajas y no directamente en los tubos del medicamento.   Si su medicamento es muy caro, por favor, pngase en contacto con nuestra oficina llamando al [phone] y presione la opcin 4 o envenos un mensaje a travs de MyChart.   No podemos decirle cul ser su copago por los medicamentos por adelantado ya que esto es diferente dependiendo de la cobertura de su seguro. Sin embargo, es posible que podamos encontrar un medicamento sustituto a menor costo o llenar un formulario para que el  seguro cubra el medicamento que se considera necesario.   Si se requiere una autorizacin previa para que su compaa de seguros cubra su medicamento, por favor permtanos de 1 a 2 das hbiles para completar este proceso.  Los precios de los medicamentos varan con frecuencia dependiendo del lugar de dnde se surte la receta y alguna farmacias pueden ofrecer precios ms baratos.  El sitio web www.goodrx.com tiene cupones para medicamentos de diferentes farmacias. Los precios aqu no tienen en cuenta lo que podra costar con la ayuda del seguro (puede ser ms barato con su seguro), pero el sitio web puede darle el precio si no utiliz ningn seguro.  - Puede imprimir el cupn correspondiente y llevarlo con su receta a la farmacia.  - Tambin puede pasar por nuestra oficina durante el horario de atencin regular y recoger una tarjeta de cupones de GoodRx.  - Si necesita que su receta se enve electrnicamente a una farmacia diferente, informe a nuestra oficina a travs de MyChart de [place]   o por telfono llamando al [phone] y presione la opcin 4.  

## 2023-01-05 ENCOUNTER — Encounter: Payer: Self-pay | Admitting: Urology

## 2023-01-05 ENCOUNTER — Other Ambulatory Visit: Payer: BC Managed Care – PPO | Admitting: Urology

## 2023-01-12 ENCOUNTER — Other Ambulatory Visit: Payer: BC Managed Care – PPO | Admitting: Urology

## 2023-01-26 ENCOUNTER — Inpatient Hospital Stay: Payer: BC Managed Care – PPO | Attending: Oncology

## 2023-01-26 DIAGNOSIS — Q8501 Neurofibromatosis, type 1: Secondary | ICD-10-CM | POA: Diagnosis not present

## 2023-01-26 DIAGNOSIS — D509 Iron deficiency anemia, unspecified: Secondary | ICD-10-CM | POA: Insufficient documentation

## 2023-01-26 DIAGNOSIS — R0602 Shortness of breath: Secondary | ICD-10-CM | POA: Diagnosis not present

## 2023-01-26 DIAGNOSIS — R5383 Other fatigue: Secondary | ICD-10-CM | POA: Diagnosis not present

## 2023-01-26 DIAGNOSIS — Z87891 Personal history of nicotine dependence: Secondary | ICD-10-CM | POA: Insufficient documentation

## 2023-01-26 LAB — CBC WITH DIFFERENTIAL/PLATELET
Abs Immature Granulocytes: 0.01 10*3/uL (ref 0.00–0.07)
Basophils Absolute: 0 10*3/uL (ref 0.0–0.1)
Basophils Relative: 1 %
Eosinophils Absolute: 0.3 10*3/uL (ref 0.0–0.5)
Eosinophils Relative: 4 %
HCT: 42.6 % (ref 36.0–46.0)
Hemoglobin: 14 g/dL (ref 12.0–15.0)
Immature Granulocytes: 0 %
Lymphocytes Relative: 24 %
Lymphs Abs: 1.6 10*3/uL (ref 0.7–4.0)
MCH: 28.6 pg (ref 26.0–34.0)
MCHC: 32.9 g/dL (ref 30.0–36.0)
MCV: 87.1 fL (ref 80.0–100.0)
Monocytes Absolute: 0.5 10*3/uL (ref 0.1–1.0)
Monocytes Relative: 8 %
Neutro Abs: 4.2 10*3/uL (ref 1.7–7.7)
Neutrophils Relative %: 63 %
Platelets: 182 10*3/uL (ref 150–400)
RBC: 4.89 MIL/uL (ref 3.87–5.11)
RDW: 11.6 % (ref 11.5–15.5)
WBC: 6.7 10*3/uL (ref 4.0–10.5)
nRBC: 0 % (ref 0.0–0.2)

## 2023-01-26 LAB — IRON AND TIBC
Iron: 86 ug/dL (ref 28–170)
Saturation Ratios: 38 % — ABNORMAL HIGH (ref 10.4–31.8)
TIBC: 228 ug/dL — ABNORMAL LOW (ref 250–450)
UIBC: 142 ug/dL

## 2023-01-26 LAB — FERRITIN: Ferritin: 66 ng/mL (ref 11–307)

## 2023-01-26 MED FILL — Iron Sucrose Inj 20 MG/ML (Fe Equiv): INTRAVENOUS | Qty: 10 | Status: AC

## 2023-01-27 ENCOUNTER — Encounter: Payer: Self-pay | Admitting: Dermatology

## 2023-01-27 ENCOUNTER — Ambulatory Visit (INDEPENDENT_AMBULATORY_CARE_PROVIDER_SITE_OTHER): Payer: BC Managed Care – PPO | Admitting: Dermatology

## 2023-01-27 ENCOUNTER — Inpatient Hospital Stay: Payer: BC Managed Care – PPO

## 2023-01-27 ENCOUNTER — Inpatient Hospital Stay (HOSPITAL_BASED_OUTPATIENT_CLINIC_OR_DEPARTMENT_OTHER): Payer: BC Managed Care – PPO | Admitting: Oncology

## 2023-01-27 ENCOUNTER — Encounter: Payer: Self-pay | Admitting: Oncology

## 2023-01-27 VITALS — BP 89/65 | HR 66

## 2023-01-27 VITALS — BP 90/60 | HR 62 | Temp 96.7°F | Resp 18 | Wt 144.8 lb

## 2023-01-27 DIAGNOSIS — D508 Other iron deficiency anemias: Secondary | ICD-10-CM

## 2023-01-27 DIAGNOSIS — L578 Other skin changes due to chronic exposure to nonionizing radiation: Secondary | ICD-10-CM

## 2023-01-27 DIAGNOSIS — L821 Other seborrheic keratosis: Secondary | ICD-10-CM

## 2023-01-27 DIAGNOSIS — Z86018 Personal history of other benign neoplasm: Secondary | ICD-10-CM

## 2023-01-27 DIAGNOSIS — D229 Melanocytic nevi, unspecified: Secondary | ICD-10-CM

## 2023-01-27 DIAGNOSIS — Q8501 Neurofibromatosis, type 1: Secondary | ICD-10-CM | POA: Diagnosis not present

## 2023-01-27 DIAGNOSIS — D2261 Melanocytic nevi of right upper limb, including shoulder: Secondary | ICD-10-CM

## 2023-01-27 DIAGNOSIS — L814 Other melanin hyperpigmentation: Secondary | ICD-10-CM

## 2023-01-27 DIAGNOSIS — D509 Iron deficiency anemia, unspecified: Secondary | ICD-10-CM | POA: Diagnosis not present

## 2023-01-27 DIAGNOSIS — D2371 Other benign neoplasm of skin of right lower limb, including hip: Secondary | ICD-10-CM

## 2023-01-27 DIAGNOSIS — Z1283 Encounter for screening for malignant neoplasm of skin: Secondary | ICD-10-CM | POA: Diagnosis not present

## 2023-01-27 DIAGNOSIS — D2372 Other benign neoplasm of skin of left lower limb, including hip: Secondary | ICD-10-CM

## 2023-01-27 DIAGNOSIS — D492 Neoplasm of unspecified behavior of bone, soft tissue, and skin: Secondary | ICD-10-CM

## 2023-01-27 NOTE — Assessment & Plan Note (Addendum)
Labs reviewed and discussed with patient Iron level and hemoglobin have both improved.  Lab Results  Component Value Date   IRON 86 01/26/2023   TIBC 228 (L) 01/26/2023   IRONPCTSAT 38 (H) 01/26/2023   FERRITIN 66 01/26/2023   HGB 14.0 01/26/2023    No need for IV venofer for now.

## 2023-01-27 NOTE — Progress Notes (Unsigned)
Follow-Up Visit   Subjective  Andrea Bernard is a 57 y.o. female who presents for the following: Skin Cancer Screening and Full Body Skin Exam  The patient presents for Total-Body Skin Exam (TBSE) for skin cancer screening and mole check. The patient has spots, moles and lesions to be evaluated, some may be new or changing and the patient has concerns that these could be cancer.    The following portions of the chart were reviewed this encounter and updated as appropriate: medications, allergies, medical history  Review of Systems:  No other skin or systemic complaints except as noted in HPI or Assessment and Plan.  Objective  Well appearing patient in no apparent distress; mood and affect are within normal limits.  A full examination was performed including scalp, head, eyes, ears, nose, lips, neck, chest, axillae, abdomen, back, buttocks, bilateral upper extremities, bilateral lower extremities, hands, feet, fingers, toes, fingernails, and toenails. All findings within normal limits unless otherwise noted below.   Relevant physical exam findings are noted in the Assessment and Plan.  Right Forearm - Posterior 0.4 cm slightly irregular med to dark brown papule         Assessment & Plan   HISTORY OF DYSPLASTIC NEVUS. Right lateral thigh, moderate atypia. 03/02/2008 No evidence of recurrence today Recommend regular full body skin exams Recommend daily broad spectrum sunscreen SPF 30+ to sun-exposed areas, reapply every 2 hours as needed.  Call if any new or changing lesions are noted between office visits   LENTIGINES, SEBORRHEIC KERATOSES, HEMANGIOMAS - Benign normal skin lesions - Benign-appearing - Call for any changes  MELANOCYTIC NEVI - Tan-brown and/or pink-flesh-colored symmetric macules and papules - Benign appearing on exam today - Observation - Call clinic for new or changing moles - Recommend daily use of broad spectrum spf 30+ sunscreen to sun-exposed  areas.   ACTINIC DAMAGE - Chronic condition, secondary to cumulative UV/sun exposure - diffuse scaly erythematous macules with underlying dyspigmentation - Recommend daily broad spectrum sunscreen SPF 30+ to sun-exposed areas, reapply every 2 hours as needed.  - Staying in the shade or wearing long sleeves, sun glasses (UVA+UVB protection) and wide brim hats (4-inch brim around the entire circumference of the hat) are also recommended for sun protection.  - Call for new or changing lesions.  DERMATOFIBROMA Exam: Firm pink/brown papulenodule with dimple sign at right medial thigh, left thigh. Treatment Plan: A dermatofibroma is a benign growth possibly related to trauma, such as an insect bite, cut from shaving, or inflamed acne-type bump.  Treatment options to remove include shave or excision with resulting scar and risk of recurrence.  Since benign-appearing and not bothersome, will observe for now.    SKIN CANCER SCREENING PERFORMED TODAY.  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 Currently well-controlled.   Treatment Plan: Continue metformin 500 mg extended release daily with evening meal. Advised if she starts trimethoprim for urinary issues, this can increase the effect of the metformin, but as she is on the lowest dose of metformin this likely would not be an issue.    Reviewed risk of nausea and GI side effects with metformin.  Neoplasm of skin Right Forearm - Posterior  Epidermal / dermal shaving  Lesion diameter (cm):  0.4 Informed consent: discussed and consent obtained   Patient was prepped and draped in usual sterile fashion: Area prepped with alcohol. Anesthesia: the lesion was anesthetized in a standard  fashion   Anesthetic:  1% lidocaine w/ epinephrine 1-100,000 buffered w/ 8.4% NaHCO3 Instrument used: DermaBlade   Hemostasis achieved with: pressure, aluminum chloride and  electrodesiccation   Outcome: patient tolerated procedure well   Post-procedure details: wound care instructions given   Post-procedure details comment:  Ointment and small bandage applied  Specimen 1 - Surgical pathology Differential Diagnosis: R/O atypia  Check Margins: No   Return in about 1 year (around 01/27/2024) for TBSE.  I, Lawson Radar, CMA, am acting as scribe for Darden Dates, MD.   Documentation: I have reviewed the above documentation for accuracy and completeness, and I agree with the above.  Darden Dates, MD

## 2023-01-27 NOTE — Patient Instructions (Addendum)
Wound Care Instructions  Cleanse wound gently with soap and water once a day then pat dry with clean gauze. Apply a thin coat of Petrolatum (petroleum jelly, "Vaseline") over the wound (unless you have an allergy to this). We recommend that you use a new, sterile tube of Vaseline. Do not pick or remove scabs. Do not remove the yellow or white "healing tissue" from the base of the wound.  Cover the wound with fresh, clean, nonstick gauze and secure with paper tape. You may use Band-Aids in place of gauze and tape if the wound is small enough, but would recommend trimming much of the tape off as there is often too much. Sometimes Band-Aids can irritate the skin.  You should call the office for your biopsy report after 1 week if you have not already been contacted.  If you experience any problems, such as abnormal amounts of bleeding, swelling, significant bruising, significant pain, or evidence of infection, please call the office immediately.  FOR ADULT SURGERY PATIENTS: If you need something for pain relief you may take 1 extra strength Tylenol (acetaminophen) AND 2 Ibuprofen (  each) together every 4 hours as needed for pain. (do not take these if you are allergic to them or if you have a reason you should not take them.) Typically, you may only need pain medication for 1 to 3 days.    Continue metformin 500 mg extended release daily with evening meal.    Recommend daily broad spectrum sunscreen SPF 30+ to sun-exposed areas, reapply every 2 hours as needed. Call for new or changing lesions.  Staying in the shade or wearing long sleeves, sun glasses (UVA+UVB protection) and wide brim hats (4-inch brim around the entire circumference of the hat) are also recommended for sun protection.    Recommend taking Heliocare sun protection supplement daily in sunny weather for additional sun protection. For maximum protection on the sunniest days, you can take up to 2 capsules of regular Heliocare OR  take 1 capsule of Heliocare Ultra. For prolonged exposure (such as a full day in the sun), you can repeat your dose of the supplement 4 hours after your first dose. Heliocare can be purchased at [employer], at some [facility] or at [email or web address].    Melanoma ABCDEs  Melanoma is the most dangerous type of skin cancer, and is the leading cause of death from skin disease.  You are more likely to develop melanoma if you: Have light-colored skin, light-colored eyes, or red or blond hair Spend a lot of time in the sun Tan regularly, either outdoors or in a tanning bed Have had blistering sunburns, especially during childhood Have a close family member who has had a melanoma Have atypical moles or large birthmarks  Early detection of melanoma is key since treatment is typically straightforward and cure rates are extremely high if we catch it early.   The first sign of melanoma is often a change in a mole or a new dark spot.  The ABCDE system is a way of remembering the signs of melanoma.  A for asymmetry:  The two halves do not match. B for border:  The edges of the growth are irregular. C for color:  A mixture of colors are present instead of an even brown color. D for diameter:  Melanomas are usually (but not always) greater than 6mm - the size of a pencil eraser. E for evolution:  The spot keeps changing in size, shape, and color.  Please check your  skin once per month between visits. You can use a small mirror in front and a large mirror behind you to keep an eye on the back side or your body.   If you see any new or changing lesions before your next follow-up, please call to schedule a visit.  Please continue daily skin protection including broad spectrum sunscreen SPF 30+ to sun-exposed areas, reapplying every 2 hours as needed when you're outdoors.   Staying in the shade or wearing long sleeves, sun glasses (UVA+UVB protection) and wide brim hats (4-inch brim around the  entire circumference of the hat) are also recommended for sun protection.    Due to recent changes in healthcare laws, you may see results of your pathology and/or laboratory studies on MyChart before the doctors have had a chance to review them. We understand that in some cases there may be results that are confusing or concerning to you. Please understand that not all results are received at the same time and often the doctors may need to interpret multiple results in order to provide you with the best plan of care or course of treatment. Therefore, we ask that you please give [place] 2 business days to thoroughly review all your results before contacting the office for clarification. Should we see a critical lab result, you will be contacted sooner.   If You Need Anything After Your Visit  If you have any questions or concerns for your doctor, please call our main line at [phone] and press option 4 to reach your doctor's medical assistant. If no one answers, please leave a voicemail as directed and we will return your call as soon as possible. Messages left after 4 pm will be answered the following business day.   You may also send [place] a message via MyChart. We typically respond to MyChart messages within 1-2 business days.  For prescription refills, please ask your pharmacy to contact our office. Our fax number is [phone].  If you have an urgent issue when the clinic is closed that cannot wait until the next business day, you can page your doctor at the number below.    Please note that while we do our best to be available for urgent issues outside of office hours, we are not available 24/7.   If you have an urgent issue and are unable to reach [place], you may choose to seek medical care at your doctor's office, retail clinic, urgent care center, or emergency room.  If you have a medical emergency, please immediately call 911 or go to the emergency department.  Pager Numbers  - Dr.  Gwen Pounds: [phone]  - Dr. Neale Burly: [phone]  - Dr. Roseanne Reno: [phone]  In the event of inclement weather, please call our main line at [phone] for an update on the status of any delays or closures.  Dermatology Medication Tips: Please keep the boxes that topical medications come in in order to help keep track of the instructions about where and how to use these. Pharmacies typically print the medication instructions only on the boxes and not directly on the medication tubes.   If your medication is too expensive, please contact our office at [phone] option 4 or send [place] a message through MyChart.   We are unable to tell what your co-pay for medications will be in advance as this is different depending on your insurance coverage. However, we may be able to find a substitute medication at lower cost or fill out paperwork to get insurance  to cover a needed medication.   If a prior authorization is required to get your medication covered by your insurance company, please allow [place] 1-2 business days to complete this process.  Drug prices often vary depending on where the prescription is filled and some pharmacies may offer cheaper prices.  The website www.goodrx.com contains coupons for medications through different pharmacies. The prices here do not account for what the cost may be with help from insurance (it may be cheaper with your insurance), but the website can give you the price if you did not use any insurance.  - You can print the associated coupon and take it with your prescription to the pharmacy.  - You may also stop by our office during regular business hours and pick up a GoodRx coupon card.  - If you need your prescription sent electronically to a different pharmacy, notify our office through [facility] or by phone at [phone] option 4.     Si Usted Necesita Algo Despus de Su Visita  Tambin puede enviarnos un mensaje a travs de Clinical cytogeneticist. Por lo  general respondemos a los mensajes de MyChart en el transcurso de 1 a 2 das hbiles.  Para renovar recetas, por favor pida a su farmacia que se ponga en contacto con nuestra oficina. Annie Sable de fax es [place] [phone].  Si tiene un asunto urgente cuando la clnica est cerrada y que no puede esperar hasta el siguiente da hbil, puede llamar/localizar a su doctor(a) al nmero que aparece a continuacin.   Por favor, tenga en cuenta que aunque hacemos todo lo posible para estar disponibles para asuntos urgentes fuera del horario de [place], no estamos disponibles las 24 horas del da, los 7 [address]  [address] de la [place].   Si tiene un problema urgente y no puede comunicarse con nosotros, puede optar por buscar atencin mdica  en el consultorio de su doctor(a), en una clnica privada, en un centro de atencin urgente o en una sala de emergencias.  Si tiene Engineer, drilling, por favor llame inmediatamente al 911 o vaya a la sala de emergencias.  Nmeros de bper  - Dr. Gwen Pounds: [phone]  - Dra. Moye: [phone]  - Dra. Roseanne Reno: [phone]  En caso de inclemencias del [place], por favor llame a Lacy Duverney principal al [phone] para una actualizacin sobre el [place] de cualquier retraso o cierre.  Consejos para la medicacin en dermatologa: Por favor, guarde las cajas en las que vienen los medicamentos de uso tpico para ayudarle a seguir las instrucciones sobre dnde y cmo usarlos. Las farmacias generalmente imprimen las instrucciones del medicamento slo en las cajas y no directamente en los tubos del [place].   Si su medicamento es muy caro, por favor, pngase en contacto con Rolm Gala llamando al [phone] y presione la opcin 4 o envenos un mensaje a travs de Clinical cytogeneticist.   No podemos decirle cul ser su copago por los medicamentos por adelantado ya que esto es diferente dependiendo de la cobertura de su seguro. Sin embargo, es posible que podamos encontrar un  medicamento sustituto a Audiological scientist un formulario para que el seguro cubra el medicamento que se considera necesario.   Si se requiere una autorizacin previa para que su compaa de seguros [place] su medicamento, por favor permtanos de 1 a 2 das hbiles para completar [address]  Los precios de los medicamentos varan con frecuencia dependiendo del Environmental consultant de dnde se surte la receta y alguna farmacias pueden ofrecer precios  ms baratos.  El sitio web www.goodrx.com tiene cupones para medicamentos de Airline pilot. Los precios aqu no tienen en cuenta lo que podra costar con la ayuda del seguro (puede ser ms barato con su seguro), pero el sitio web puede darle el precio si no utiliz Research scientist (physical sciences).  - Puede imprimir el cupn correspondiente y llevarlo con su receta a la farmacia.  - Tambin puede pasar por nuestra oficina durante el horario de atencin regular y Charity fundraiser una tarjeta de cupones de GoodRx.  - Si necesita que su receta se enve electrnicamente a una farmacia diferente, informe a nuestra oficina a travs de MyChart de [place] o por telfono llamando al [phone] y presione la opcin 4.

## 2023-01-27 NOTE — Progress Notes (Signed)
Hematology/Oncology Progress note [phone] [phone]     CHIEF COMPLAINTS/REASON FOR VISIT:  Anemia, history of NF1  ASSESSMENT & PLAN:   Iron deficiency anemia Labs reviewed and discussed with patient Iron level and hemoglobin have both improved.  Lab Results  Component Value Date   IRON 86 01/26/2023   TIBC 228 (L) 01/26/2023   IRONPCTSAT 38 (H) 01/26/2023   FERRITIN 66 01/26/2023   HGB 14.0 01/26/2023    No need for IV venofer for now.   Type 1 neurofibromatosis (HCC) Continue follow-up with neurology. 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 Recommend annual mammogram, recommend annual MRI for breast cancer screening-    Orders Placed This Encounter  Procedures   CBC with Differential (Cancer Center Only)    Standing Status:   Future    Standing Expiration Date:   01/27/2024   Iron and TIBC    Standing Status:   Future    Standing Expiration Date:   01/27/2024   Ferritin    Standing Status:   Future    Standing Expiration Date:   01/27/2024   Retic Panel    Standing Status:   Future    Standing Expiration Date:   01/27/2024   Follow-up in 6 months  All questions were answered. The patient knows to call the clinic with any problems, questions or concerns.  Rickard Patience, MD, PhD [facility] Hematology Oncology 01/27/2023     HISTORY OF PRESENTING ILLNESS:  BOB EASTWOOD is a  57 y.o.  female with PMH listed below who was referred to me for anemia Reviewed patient's recent labs that was done.  She was found to have abnormal CBC on 02/18/2022, hemoglobin 11.2.  MCV 81.  Iron panel showed iron saturation 11, ferritin 7.  Patient was previously seen by me 4 years ago for iron deficiency anemia and previously tolerated IV Venofer treatments. She is postmenopausal, LMP January 2020.  She is not able to tolerate oral iron supplementation. + Fatigue, shortness of breath with exertion. She denies recent chest pain on  exertion, pre-syncopal episodes, or palpitations She had not noticed any recent bleeding such as epistaxis, hematuria or hematochezia.  Patient uses over-the-counter NSAIDs as needed for arthritis. Her last colonoscopy was 2019. She denies any pica and eats a variety of diet.   INTERVAL HISTORY ROMEY COHEA is a 57 y.o. female who has above history reviewed by me today presents for follow up visit for iron deficiency anemia, history of NF1 S/p IV venofer treatments.  Patient tolerates well.  No new complaints.  MEDICAL HISTORY:  Past Medical History:  Diagnosis Date   Arthritis    Dysplastic nevus 03/02/2008   Right lateral thigh. Moderate atypia, limited margins free.   GERD (gastroesophageal reflux disease)    Headache    Iron deficiency anemia 06/15/2017   Neurofibromatosis     SURGICAL HISTORY: Past Surgical History:  Procedure Laterality Date   AUGMENTATION MAMMAPLASTY Bilateral 12/10/2020   BREAST ENHANCEMENT SURGERY Bilateral 12/04/2020   carpel tunnel release Bilateral    CESAREAN SECTION     x 4   COLONOSCOPY WITH PROPOFOL N/A 09/04/2017   Procedure: COLONOSCOPY WITH PROPOFOL;  Surgeon: Wyline Mood, MD;  Location: [facility] ENDOSCOPY;  Service: Gastroenterology;  Laterality: N/A;   COLONOSCOPY WITH PROPOFOL N/A 12/21/2017   Procedure: COLONOSCOPY WITH PROPOFOL;  Surgeon: Wyline Mood, MD;  Location: [facility] ENDOSCOPY;  Service: Gastroenterology;  Laterality: N/A;   ESOPHAGOGASTRODUODENOSCOPY (EGD) WITH PROPOFOL  N/A 09/04/2017   Procedure: ESOPHAGOGASTRODUODENOSCOPY (EGD) WITH PROPOFOL;  Surgeon: Wyline Mood, MD;  Location: [facility] ENDOSCOPY;  Service: Gastroenterology;  Laterality: N/A;   HERNIA REPAIR     HERNIA REPAIR  12/04/2020   TUBAL LIGATION     tummy tuck  12/04/2020   removed 20 masses on stomach   TUMOR REMOVAL     x8    SOCIAL HISTORY: Social History   Socioeconomic History   Marital status: Married    Spouse name: Not on file   Number of children:  Not on file   Years of education: Not on file   Highest education level: Not on file  Occupational History   Not on file  Tobacco Use   Smoking status: Former    Types: Cigarettes   Smokeless tobacco: Never   Tobacco comments:    Age [age]  Vaping Use   Vaping Use: Never used  Substance and Sexual Activity   Alcohol use: No   Drug use: Not Currently   Sexual activity: Yes    Birth control/protection: Surgical  Other Topics Concern   Not on file  Social History Narrative   Not on file   Social Determinants of Health   Financial Resource Strain: Not on file  Food Insecurity: Not on file  Transportation Needs: Not on file  Physical Activity: Not on file  Stress: Not on file  Social Connections: Not on file  Intimate Partner Violence: Not on file    FAMILY HISTORY: Family History  Problem Relation Age of Onset   Anemia Mother    Lupus Mother    Sjogren's syndrome Mother    Rheum arthritis Mother    Schizophrenia Father    Neurofibromatosis Brother    Neurofibromatosis Son    Neurofibromatosis Son    Neurofibromatosis Daughter    Bladder Cancer Neg Hx    Kidney cancer Neg Hx    Breast cancer Neg Hx    Ovarian cancer Neg Hx    Colon cancer Neg Hx     ALLERGIES:  is allergic to ciprofloxacin and nitrofuran derivatives.  MEDICATIONS:  Current Outpatient Medications  Medication Sig Dispense Refill   albuterol (VENTOLIN HFA) 108 (90 Base) MCG/ACT inhaler TAKE 2 PUFFS BY MOUTH EVERY 6 HOURS AS NEEDED FOR WHEEZE OR SHORTNESS OF BREATH 8.5 each 1   gabapentin (NEURONTIN) 300 MG capsule Take 300 mg by mouth 3 (three) times daily.  0   ibuprofen (ADVIL) 200 MG tablet Take 200 mg by mouth as needed.     metFORMIN (GLUCOPHAGE-XR) 500 MG 24 hr tablet TAKE 1 TABLET BY MOUTH DAILY WITH SUPPER. 90 tablet 3   OXYCONTIN 80 MG 12 hr tablet Take 80 mg by mouth every 12 (twelve) hours.  0   OZEMPIC, 0.25 OR 0.5 MG/DOSE, 2 MG/3ML SOPN INJECT 0.25MG  INTO THE SKIN ONCE A WEEK FOR 4  WEEKS THEN INCREASE TO 0.5MG  WEEKLY. 3 mL 3   pantoprazole (PROTONIX) 40 MG tablet TAKE 1 TABLET BY MOUTH TWICE A DAY 180 tablet 1   SUMAtriptan (IMITREX) 100 MG tablet TAKE 1 TABLET BY MOUTH DAILY AS NEEDED FOR MIGRAINE. 9 tablet 3   No current facility-administered medications for this visit.   Facility-Administered Medications Ordered in Other Visits  Medication Dose Route Frequency Provider Last Rate Last Admin   iron sucrose (VENOFER) 200 mg IVPB  200 mg Intravenous Weekly Rickard Patience, MD       sodium chloride flush (NS) 0.9 % injection 10 mL  10  mL Intracatheter PRN Rickard Patience, MD        Review of Systems  Constitutional:  Positive for fatigue. Negative for appetite change, chills and fever.  HENT:   Negative for hearing loss and voice change.   Eyes:  Negative for eye problems.  Respiratory:  Negative for chest tightness and cough.   Cardiovascular:  Negative for chest pain.  Gastrointestinal:  Negative for abdominal distention, abdominal pain and blood in stool.  Endocrine: Negative for hot flashes.  Genitourinary:  Negative for difficulty urinating and frequency.   Musculoskeletal:  Positive for arthralgias.  Skin:  Negative for itching and rash.  Neurological:  Negative for extremity weakness.  Hematological:  Negative for adenopathy.  Psychiatric/Behavioral:  Negative for confusion.     PHYSICAL EXAMINATION: ECOG PERFORMANCE STATUS: 1 - Symptomatic but completely ambulatory Vitals:   01/27/23 1322  BP: 90/60  Pulse: 62  Resp: 18  Temp: (!) 96.7 F (35.9 C)   Filed Weights   01/27/23 1322  Weight: 144 lb 12.8 oz (65.7 kg)    Physical Exam Constitutional:      General: She is not in acute distress. HENT:     Head: Normocephalic and atraumatic.  Eyes:     General: No scleral icterus. Cardiovascular:     Rate and Rhythm: Normal rate and regular rhythm.     Heart sounds: Normal heart sounds.  Pulmonary:     Effort: Pulmonary effort is normal. No respiratory  distress.     Breath sounds: No wheezing.  Abdominal:     General: Bowel sounds are normal. There is no distension.     Palpations: Abdomen is soft.  Musculoskeletal:        General: No deformity. Normal range of motion.     Cervical back: Normal range of motion and neck supple.  Skin:    General: Skin is warm and dry.     Findings: No erythema or rash.  Neurological:     Mental Status: She is alert and oriented to person, place, and time. Mental status is at baseline.     Cranial Nerves: No cranial nerve deficit.     Coordination: Coordination normal.  Psychiatric:        Mood and Affect: Mood normal.      LABORATORY DATA:  I have reviewed the data as listed     Latest Ref Rng & Units 01/26/2023    8:53 AM 07/23/2022    3:23 PM 02/18/2022   11:43 AM  CBC  WBC 4.0 - 10.5 K/uL 6.7  6.3  7.0   Hemoglobin 12.0 - 15.0 g/dL 16.1  09.6  04.5   Hematocrit 36.0 - 46.0 % 42.6  43.2  36.1   Platelets 150 - 400 K/uL 182  270  287       Latest Ref Rng & Units 02/18/2022   11:43 AM 02/12/2021   11:15 PM 01/23/2021    4:50 PM  CMP  Glucose 70 - 99 mg/dL 96  409  811   BUN 6 - 24 mg/dL Creatinine 0.57 - 1.00 mg/dL 9.14  7.82  9.56   Sodium 134 - 144 mmol/L 138  139  142   Potassium 3.5 - 5.2 mmol/L 4.8  4.1  4.8   Chloride 96 - 106 mmol/L 100  107  104   CO2 20 - 29 mmol/L Calcium 8.7 - 10.2 mg/dL 9.4  8.6  9.3   Total Protein 6.0 - 8.5 g/dL 7.1  6.5  7.1   Total Bilirubin 0.0 - 1.2 mg/dL 0.4  0.7  <1.3   Alkaline Phos 44 - 121 IU/L 98  64  91   AST 0 - 40 IU/L 22  34  23   ALT 0 - 32 IU/L 17  16  17     Lab Results  Component Value Date   IRON 86 01/26/2023   TIBC 228 (L) 01/26/2023   FERRITIN 66 01/26/2023       RADIOGRAPHIC STUDIES: I have personally reviewed the radiological images as listed and agreed with the findings in the report. CT Abdomen Pelvis W Wo Contrast  Result Date: 01/02/2023 CLINICAL DATA:  Indeterminate complex renal cyst.   Recurrent UTI. EXAM: CT ABDOMEN AND PELVIS WITHOUT AND WITH CONTRAST TECHNIQUE: Multidetector CT imaging of the abdomen and pelvis was performed following the standard protocol before and following the bolus administration of intravenous contrast. RADIATION DOSE REDUCTION: This exam was performed according to the departmental dose-optimization program which includes automated exposure control, adjustment of the mA and/or kV according to patient size and/or use of iterative reconstruction technique. CONTRAST:  OMNIPAQUE IOHEXOL 300 MG/ML  SOLN COMPARISON:  11/04/2017 FINDINGS: Lower chest: Unremarkable. Hepatobiliary: Tiny hypodensity in the right liver is too small to characterize but stable in the interval consistent with benign etiology. No followup imaging is recommended. There is no evidence for gallstones, gallbladder wall thickening, or pericholecystic fluid. No intrahepatic or extrahepatic biliary dilation. Pancreas: No focal mass lesion. No dilatation of the main duct. No intraparenchymal cyst. No peripancreatic edema. Spleen: No splenomegaly. No focal mass lesion. Adrenals/Urinary Tract: No adrenal nodule or mass. Precontrast imaging shows no stones in either kidney or ureter. No bladder stones. Imaging after IV contrast administration shows no suspicious enhancing lesion in either kidney. 13 mm well-defined homogeneous low-density lesion in the anterior interpolar left kidney is stable since prior study. This has attenuation slightly higher than would be expected for a simple cyst but shows no evidence for enhancement after IV contrast administration. 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 Similar 10 mm lesion posterior lower interpolar left kidney is also unchanged in the interval compatible with a benign cyst. Several additional very tiny (5 mm or less) low-density lesions in the left kidney are too small to characterize but were present previously and are most likely benign.  6 mm hypoattenuating lesion in the lower pole left kidney is too small to characterize and despite being new since prior study is statistically benign. Delayed post-contrast imaging shows no wall thickening or soft tissue filling defect in either intrarenal collecting system or renal pelvis. Stomach/Bowel: Stomach is unremarkable. No gastric wall thickening. No evidence of outlet obstruction. Duodenum is normally positioned as is the ligament of Treitz. No small bowel wall thickening. No small bowel dilatation. The terminal ileum is normal. The appendix is not well visualized, but there is no edema or inflammation in the region of the cecum. No gross colonic mass. No colonic wall thickening. Large stool volume evident. Vascular/Lymphatic: No abdominal aortic aneurysm. No abdominal aortic atherosclerotic calcification. There is no gastrohepatic or hepatoduodenal ligament lymphadenopathy. No retroperitoneal or mesenteric lymphadenopathy. No pelvic sidewall lymphadenopathy. Reproductive: Unremarkable. Other: No intraperitoneal free fluid. Musculoskeletal: No worrisome lytic or sclerotic osseous abnormality. IMPRESSION: 1. 10 mm lesion of concern on CT from 5 years ago is stable in size in the interval consistent with benign etiology (Bosniak II cyst). No  followup imaging is recommended. 2. Additional left renal lesions are stable with a new 6 mm lesion in the lower pole, too small to characterize but statistically most likely benign. No specific imaging follow-up recommended for these additional lesions. 3. Large stool volume. Imaging features could be compatible with clinical constipation. Electronically Signed   By: Kennith Center M.D.   On: 01/02/2023 10:42

## 2023-01-27 NOTE — Assessment & Plan Note (Addendum)
Continue follow-up with neurology. 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 Recommend annual  mammogram, recommend annual MRI for breast cancer screening.  

## 2023-02-03 ENCOUNTER — Telehealth: Payer: Self-pay

## 2023-02-03 NOTE — Telephone Encounter (Signed)
-----   Message from Sandi Mealy, MD sent at 02/03/2023  1:11 PM EDT ----- Skin , right forearm posterior DYSPLASTIC JUNCTIONAL NEVUS WITH MODERATE ATYPIA, CLOSE TO MARGIN --> recheck at follow-up within 6 months. Call if brown spot coming back.  This is a MODERATELY ATYPICAL MOLE. On the spectrum from normal mole to melanoma skin cancer, this is in between the two. - We need to recheck this area sometime in the next 6 months to be sure there is no evidence of the atypical mole coming back. If there is any color coming back, we would recommend repeating the biopsy to be sure the cells look normal.  - People who have a history of atypical moles do have a slightly increased risk of developing melanoma somewhere on the body, so a yearly full body skin exam by a dermatologist is recommended.  - Monthly self skin checks and daily sun protection are also recommended.  - Please call if you notice a dark spot coming back where this biopsy was taken.  - Please also call if you notice any new or changing spots anywhere else on the body before your follow-up visit.     MAs please call (and schedule if needed). Thank you!

## 2023-02-03 NOTE — Telephone Encounter (Signed)
Discussed pathology results. Patient voiced understanding. Will c/b to schedule 6 month recheck

## 2023-02-11 LAB — CBC WITH DIFFERENTIAL/PLATELET
Basophils Absolute: 0 10*3/uL (ref 0.0–0.2)
Basos: 1 %
EOS (ABSOLUTE): 0.4 10*3/uL (ref 0.0–0.4)
Eos: 5 %
Hematocrit: 37.8 % (ref 34.0–46.6)
Hemoglobin: 13.1 g/dL (ref 11.1–15.9)
Immature Grans (Abs): 0 10*3/uL (ref 0.0–0.1)
Immature Granulocytes: 0 %
Lymphocytes Absolute: 1.2 10*3/uL (ref 0.7–3.1)
Lymphs: 18 %
MCH: 29.6 pg (ref 26.6–33.0)
MCHC: 34.7 g/dL (ref 31.5–35.7)
MCV: 86 fL (ref 79–97)
Monocytes Absolute: 0.6 10*3/uL (ref 0.1–0.9)
Monocytes: 9 %
Neutrophils Absolute: 4.3 10*3/uL (ref 1.4–7.0)
Neutrophils: 67 %
Platelets: 264 10*3/uL (ref 150–450)
RBC: 4.42 x10E6/uL (ref 3.77–5.28)
RDW: 11.8 % (ref 11.7–15.4)
WBC: 6.5 10*3/uL (ref 3.4–10.8)

## 2023-02-11 LAB — COMPREHENSIVE METABOLIC PANEL
ALT: 12 IU/L (ref 0–32)
AST: 20 IU/L (ref 0–40)
Albumin/Globulin Ratio: 2.1 (ref 1.2–2.2)
Albumin: 4.4 g/dL (ref 3.8–4.9)
Alkaline Phosphatase: 48 IU/L (ref 44–121)
BUN/Creatinine Ratio: 18 (ref 9–23)
BUN: 15 mg/dL (ref 6–24)
Bilirubin Total: 0.3 mg/dL (ref 0.0–1.2)
CO2: 25 mmol/L (ref 20–29)
Calcium: 9.2 mg/dL (ref 8.7–10.2)
Chloride: 102 mmol/L (ref 96–106)
Creatinine, Ser: 0.83 mg/dL (ref 0.57–1.00)
Globulin, Total: 2.1 g/dL (ref 1.5–4.5)
Glucose: 82 mg/dL (ref 70–99)
Potassium: 4.8 mmol/L (ref 3.5–5.2)
Sodium: 139 mmol/L (ref 134–144)
Total Protein: 6.5 g/dL (ref 6.0–8.5)
eGFR: 83 mL/min/{1.73_m2} (ref >59)

## 2023-02-11 LAB — IRON,TIBC AND FERRITIN PANEL
Ferritin: 120 ng/mL (ref 15–150)
Iron Saturation: 28 % (ref 15–55)
Iron: 55 ug/dL (ref 27–159)
Total Iron Binding Capacity: 197 ug/dL — ABNORMAL LOW (ref 250–450)
UIBC: 142 ug/dL (ref 131–425)

## 2023-02-11 LAB — B12 AND FOLATE PANEL
Folate: 7.2 ng/mL (ref >3.0)
Vitamin B-12: >2000 pg/mL — ABNORMAL HIGH (ref 232–1245)

## 2023-02-11 LAB — VITAMIN D 25 HYDROXY (VIT D DEFICIENCY, FRACTURES): Vit D, 25-Hydroxy: 114 ng/mL — ABNORMAL HIGH (ref 30.0–100.0)

## 2023-02-11 LAB — LIPID PANEL WITH LDL/HDL RATIO
Cholesterol, Total: 204 mg/dL — ABNORMAL HIGH (ref 100–199)
HDL: 60 mg/dL (ref >39)
LDL Chol Calc (NIH): 131 mg/dL — ABNORMAL HIGH (ref 0–99)
LDL/HDL Ratio: 2.2 ratio (ref 0.0–3.2)
Triglycerides: 74 mg/dL (ref 0–149)
VLDL Cholesterol Cal: 13 mg/dL (ref 5–40)

## 2023-02-11 LAB — TSH+FREE T4
Free T4: 1.4 ng/dL (ref 0.82–1.77)
TSH: 0.244 u[IU]/mL — ABNORMAL LOW (ref 0.450–4.500)

## 2023-02-16 ENCOUNTER — Encounter: Payer: Self-pay | Admitting: Physician Assistant

## 2023-02-16 ENCOUNTER — Other Ambulatory Visit: Payer: Self-pay | Admitting: Physician Assistant

## 2023-02-16 ENCOUNTER — Ambulatory Visit (INDEPENDENT_AMBULATORY_CARE_PROVIDER_SITE_OTHER): Payer: BC Managed Care – PPO | Admitting: Physician Assistant

## 2023-02-16 ENCOUNTER — Telehealth: Payer: Self-pay | Admitting: Physician Assistant

## 2023-02-16 VITALS — BP 119/68 | HR 72 | Temp 98.3°F | Resp 16 | Ht 64.0 in | Wt 140.6 lb

## 2023-02-16 DIAGNOSIS — Z9882 Breast implant status: Secondary | ICD-10-CM

## 2023-02-16 DIAGNOSIS — Z0001 Encounter for general adult medical examination with abnormal findings: Secondary | ICD-10-CM

## 2023-02-16 DIAGNOSIS — N644 Mastodynia: Secondary | ICD-10-CM

## 2023-02-16 DIAGNOSIS — R7989 Other specified abnormal findings of blood chemistry: Secondary | ICD-10-CM

## 2023-02-16 DIAGNOSIS — Q8500 Neurofibromatosis, unspecified: Secondary | ICD-10-CM

## 2023-02-16 DIAGNOSIS — R300 Dysuria: Secondary | ICD-10-CM

## 2023-02-16 DIAGNOSIS — L601 Onycholysis: Secondary | ICD-10-CM

## 2023-02-16 DIAGNOSIS — K219 Gastro-esophageal reflux disease without esophagitis: Secondary | ICD-10-CM

## 2023-02-16 DIAGNOSIS — G894 Chronic pain syndrome: Secondary | ICD-10-CM

## 2023-02-16 DIAGNOSIS — G43909 Migraine, unspecified, not intractable, without status migrainosus: Secondary | ICD-10-CM

## 2023-02-16 DIAGNOSIS — E7800 Pure hypercholesterolemia, unspecified: Secondary | ICD-10-CM

## 2023-02-16 DIAGNOSIS — D509 Iron deficiency anemia, unspecified: Secondary | ICD-10-CM

## 2023-02-16 DIAGNOSIS — R7303 Prediabetes: Secondary | ICD-10-CM

## 2023-02-16 NOTE — Telephone Encounter (Signed)
Lvm notifying patient of U/S appointment date, arrival time, location-[name]

## 2023-02-16 NOTE — Progress Notes (Signed)
[facility] [address] [place], [place] [address]  Internal MEDICINE  Office Visit Note  Patient Name: Andrea Bernard  [id number]  [id number]  Date of Service: 02/16/2023  Chief Complaint  Patient presents with   Annual Exam   Gastroesophageal Reflux     HPI Pt is here for routine health maintenance examination -declines pap today, will do next time -Labs reviewed: b12 and vit D elevated and can decrease supplement, Tsh low and not on meds will need to monitor, does have mass near thyroid that has been biopsied before, but no prior problems with the thyroid. Will obtain thyroid US -Feels much better since being on ozempic and would like to continue -Will schedule mammogram, it appears an order was placed in Oct by oncology office  -UTD on colon screening  Current Medication: Outpatient Encounter Medications as of 02/16/2023  Medication Sig   albuterol (VENTOLIN HFA) 108 (90 Base) MCG/ACT inhaler TAKE 2 PUFFS BY MOUTH EVERY 6 HOURS AS NEEDED FOR WHEEZE OR SHORTNESS OF BREATH   gabapentin (NEURONTIN) 300 MG capsule Take 300 mg by mouth 3 (three) times daily.   ibuprofen (ADVIL) 200 MG tablet Take 200 mg by mouth as needed.   metFORMIN (GLUCOPHAGE-XR) 500 MG 24 hr tablet TAKE 1 TABLET BY MOUTH DAILY WITH SUPPER.   OXYCONTIN 80 MG 12 hr tablet Take 80 mg by mouth every 12 (twelve) hours.   pantoprazole (PROTONIX) 40 MG tablet TAKE 1 TABLET BY MOUTH TWICE A DAY   SUMAtriptan (IMITREX) 100 MG tablet TAKE 1 TABLET BY MOUTH DAILY AS NEEDED FOR MIGRAINE.   [DISCONTINUED] OZEMPIC, 0.25 OR 0.5 MG/DOSE, 2 MG/3ML SOPN INJECT 0.25MG  INTO THE SKIN ONCE A WEEK FOR 4 WEEKS THEN INCREASE TO 0.5MG  WEEKLY.   Facility-Administered Encounter Medications as of 02/16/2023  Medication   iron sucrose (VENOFER) 200 mg IVPB   sodium chloride flush (NS) 0.9 % injection 10 mL    Surgical History: Past Surgical History:  Procedure Laterality Date   AUGMENTATION MAMMAPLASTY Bilateral  12/10/2020   BREAST ENHANCEMENT SURGERY Bilateral 12/04/2020   carpel tunnel release Bilateral    CESAREAN SECTION     x 4   COLONOSCOPY WITH PROPOFOL N/A 09/04/2017   Procedure: COLONOSCOPY WITH PROPOFOL;  Surgeon: Wyline Mood, MD;  Location: [facility] ENDOSCOPY;  Service: Gastroenterology;  Laterality: N/A;   COLONOSCOPY WITH PROPOFOL N/A 12/21/2017   Procedure: COLONOSCOPY WITH PROPOFOL;  Surgeon: Wyline Mood, MD;  Location: [facility] ENDOSCOPY;  Service: Gastroenterology;  Laterality: N/A;   ESOPHAGOGASTRODUODENOSCOPY (EGD) WITH PROPOFOL N/A 09/04/2017   Procedure: ESOPHAGOGASTRODUODENOSCOPY (EGD) WITH PROPOFOL;  Surgeon: Wyline Mood, MD;  Location: [facility] ENDOSCOPY;  Service: Gastroenterology;  Laterality: N/A;   HERNIA REPAIR     HERNIA REPAIR  12/04/2020   TUBAL LIGATION     tummy tuck  12/04/2020   removed 20 masses on stomach   TUMOR REMOVAL     x8    Medical History: Past Medical History:  Diagnosis Date   Arthritis    Dysplastic nevus 03/02/2008   Right lateral thigh. Moderate atypia, limited margins free.   Dysplastic nevus 01/27/2023   Right posterior forearm. Moderate atypia, close to margin.   GERD (gastroesophageal reflux disease)    Headache    Iron deficiency anemia 06/15/2017   Neurofibromatosis (HCC)     Family History: Family History  Problem Relation Age of Onset   Anemia Mother    Lupus Mother    Sjogren's syndrome Mother    Rheum arthritis Mother  Schizophrenia Father    Neurofibromatosis Brother    Neurofibromatosis Son    Neurofibromatosis Son    Neurofibromatosis Daughter    Bladder Cancer Neg Hx    Kidney cancer Neg Hx    Breast cancer Neg Hx    Ovarian cancer Neg Hx    Colon cancer Neg Hx       Review of Systems  Constitutional:  Negative for chills, fatigue and unexpected weight change.  HENT:  Negative for congestion, rhinorrhea, sneezing and sore throat.   Eyes:  Negative for redness.  Respiratory:  Negative for cough, chest tightness  and shortness of breath.   Cardiovascular:  Negative for chest pain and palpitations.  Gastrointestinal:  Negative for abdominal pain, constipation, diarrhea, nausea and vomiting.  Genitourinary:  Negative for dysuria and frequency.  Musculoskeletal:  Positive for arthralgias. Negative for back pain, joint swelling and neck pain.  Skin:  Negative for rash.  Neurological: Negative.  Negative for tremors and numbness.  Hematological:  Negative for adenopathy. Does not bruise/bleed easily.  Psychiatric/Behavioral:  Negative for behavioral problems (Depression), sleep disturbance and suicidal ideas. The patient is not nervous/anxious.      Vital Signs: BP 119/68   Pulse 72   Temp 98.3 F (36.8 C)   Resp 16   Ht 5\' 4"  (1.626 m)   Wt 140 lb 9.6 oz (63.8 kg)   LMP 10/06/2018   SpO2 99%   BMI 24.13 kg/m    Physical Exam Vitals and nursing note reviewed.  Constitutional:      General: She is not in acute distress.    Appearance: Normal appearance. She is well-developed. She is not diaphoretic.  HENT:     Head: Normocephalic and atraumatic.     Mouth/Throat:     Pharynx: No oropharyngeal exudate.  Eyes:     Pupils: Pupils are equal, round, and reactive to light.  Neck:     Thyroid: No thyromegaly.     Vascular: No JVD.     Trachea: No tracheal deviation.  Cardiovascular:     Rate and Rhythm: Normal rate and regular rhythm.     Heart sounds: Normal heart sounds. No murmur heard.    No friction rub. No gallop.  Pulmonary:     Effort: Pulmonary effort is normal. No respiratory distress.     Breath sounds: No wheezing or rales.  Chest:     Chest wall: No tenderness.  Breasts:    Right: Normal. No mass.     Left: No mass.  Abdominal:     General: Bowel sounds are normal.     Palpations: Abdomen is soft.     Tenderness: There is no abdominal tenderness.  Musculoskeletal:        General: Normal range of motion.     Cervical back: Normal range of motion and neck supple.   Lymphadenopathy:     Cervical: No cervical adenopathy.  Skin:    General: Skin is warm and dry.  Neurological:     Mental Status: She is alert and oriented to person, place, and time.     Cranial Nerves: No cranial nerve deficit.  Psychiatric:        Behavior: Behavior normal.        Thought Content: Thought content normal.        Judgment: Judgment normal.      LABS: Recent Results (from the past 2160 hour(s))  POCT HgB A1C     Status: None   Collection Time: 12/11/22  8:30 AM  Result Value Ref Range   Hemoglobin A1C 5.5 4.0 - 5.6 %   HbA1c POC (<> result, manual entry)     HbA1c, POC (prediabetic range)     HbA1c, POC (controlled diabetic range)    Iron and TIBC([facility])     Status: Abnormal   Collection Time: 01/26/23  8:53 AM  Result Value Ref Range   Iron 86 28 - 170 ug/dL   TIBC 161 (L) 096 - 045 ug/dL   Saturation Ratios 38 (H) 10.4 - 31.8 %   UIBC 142 ug/dL    Comment: Performed at [facility], [address] [place], [place] [address]  Ferritin     Status: None   Collection Time: 01/26/23  8:53 AM  Result Value Ref Range   Ferritin 66 11 - 307 ng/mL    Comment: Performed at [facility], [address] [place], [place] [address]  CBC with Differential/Platelet     Status: None   Collection Time: 01/26/23  8:53 AM  Result Value Ref Range   WBC 6.7 4.0 - 10.5 K/uL   RBC 4.89 3.87 - 5.11 MIL/uL   Hemoglobin 14.0 12.0 - 15.0 g/dL   HCT 82.9 56.2 - 13.0 %   MCV 87.1 80.0 - 100.0 fL   MCH 28.6 26.0 - 34.0 pg   MCHC 32.9 30.0 - 36.0 g/dL   RDW 86.5 78.4 - 69.6 %   Platelets 182 150 - 400 K/uL   nRBC 0.0 0.0 - 0.2 %   Neutrophils Relative % 63 %   Neutro Abs 4.2 1.7 - 7.7 K/uL   Lymphocytes Relative 24 %   Lymphs Abs 1.6 0.7 - 4.0 K/uL   Monocytes Relative 8 %   Monocytes Absolute 0.5 0.1 - 1.0 K/uL   Eosinophils Relative 4 %   Eosinophils Absolute 0.3 0.0 - 0.5 K/uL   Basophils Relative 1 %   Basophils Absolute 0.0 0.0 -  0.1 K/uL   Immature Granulocytes 0 %   Abs Immature Granulocytes 0.01 0.00 - 0.07 K/uL    Comment: Performed at [facility], [address] [place], [place] [address]  CBC w/Diff/Platelet     Status: None   Collection Time: 02/10/23 10:16 AM  Result Value Ref Range   WBC 6.5 3.4 - 10.8 x10E3/uL   RBC 4.42 3.77 - 5.28 x10E6/uL   Hemoglobin 13.1 11.1 - 15.9 g/dL   Hematocrit 41.3 24.4 - 46.6 %   MCV 86 79 - 97 fL   MCH 29.6 26.6 - 33.0 pg   MCHC 34.7 31.5 - 35.7 g/dL   RDW 01.0 27.2 - 53.6 %   Platelets 264 150 - 450 x10E3/uL   Neutrophils 67 Not Estab. %   Lymphs 18 Not Estab. %   Monocytes 9 Not Estab. %   Eos 5 Not Estab. %   Basos 1 Not Estab. %   Neutrophils Absolute 4.3 1.4 - 7.0 x10E3/uL   Lymphocytes Absolute 1.2 0.7 - 3.1 x10E3/uL   Monocytes Absolute 0.6 0.1 - 0.9 x10E3/uL   EOS (ABSOLUTE) 0.4 0.0 - 0.4 x10E3/uL   Basophils Absolute 0.0 0.0 - 0.2 x10E3/uL   Immature Granulocytes 0 Not Estab. %   Immature Grans (Abs) 0.0 0.0 - 0.1 x10E3/uL  Comprehensive metabolic panel     Status: None   Collection Time: 02/10/23 10:16 AM  Result Value Ref Range   Glucose 82 70 - 99 mg/dL   BUN 15 6 - 24 mg/dL  Creatinine, Ser 0.83 0.57 - 1.00 mg/dL   eGFR 83 >16 XW/RUE/4.54   BUN/Creatinine Ratio 18 9 - 23   Sodium 139 134 - 144 mmol/L   Potassium 4.8 3.5 - 5.2 mmol/L   Chloride 102 96 - 106 mmol/L   CO2 25 20 - 29 mmol/L   Calcium 9.2 8.7 - 10.2 mg/dL   Total Protein 6.5 6.0 - 8.5 g/dL   Albumin 4.4 3.8 - 4.9 g/dL   Globulin, Total 2.1 1.5 - 4.5 g/dL   Albumin/Globulin Ratio 2.1 1.2 - 2.2   Bilirubin Total 0.3 0.0 - 1.2 mg/dL   Alkaline Phosphatase 48 44 - 121 IU/L   AST 20 0 - 40 IU/L   ALT 12 0 - 32 IU/L  TSH + free T4     Status: Abnormal   Collection Time: 02/10/23 10:16 AM  Result Value Ref Range   TSH 0.244 (L) 0.450 - 4.500 uIU/mL   Free T4 1.40 0.82 - 1.77 ng/dL  Lipid Panel With LDL/HDL Ratio     Status: Abnormal   Collection Time: 02/10/23 10:16 AM   Result Value Ref Range   Cholesterol, Total 204 (H) 100 - 199 mg/dL   Triglycerides 74 0 - 149 mg/dL   HDL 60 >09 mg/dL   VLDL Cholesterol Cal 13 5 - 40 mg/dL   LDL Chol Calc (NIH) 811 (H) 0 - 99 mg/dL   LDL/HDL Ratio 2.2 0.0 - 3.2 ratio    Comment:                                     LDL/HDL Ratio                                             Men  Women                               1/2 Avg.Risk  1.0    1.5                                   Avg.Risk  3.6    3.2                                2X Avg.Risk  6.2    5.0                                3X Avg.Risk  8.0    6.1   Fe+TIBC+Fer     Status: Abnormal   Collection Time: 02/10/23 10:16 AM  Result Value Ref Range   Total Iron Binding Capacity 197 (L) 250 - 450 ug/dL   UIBC 914 782 - 956 ug/dL   Iron 55 27 - 213 ug/dL   Iron Saturation 28 15 - 55 %   Ferritin 120 15 - 150 ng/mL  VITAMIN D 25 Hydroxy (Vit-D Deficiency, Fractures)     Status: Abnormal   Collection Time: 02/10/23 10:16 AM  Result Value Ref Range   Vit D, 25-Hydroxy 114.0 (H) 30.0 - 100.0 ng/mL  Comment: Vitamin D deficiency has been defined by the Institute of Medicine and an Endocrine Society practice guideline as a level of serum 25-OH vitamin D less than 20 ng/mL (1,2). The Endocrine Society went on to further define vitamin D insufficiency as a level between 21 and 29 ng/mL (2). 1. IOM (Institute of Medicine). 2010. Dietary reference    intakes for calcium and D. [place]: The    [employer]. 2. Holick MF, Binkley Rothsay, Bischoff-Ferrari HA, et al.    Evaluation, treatment, and prevention of vitamin D    deficiency: an Endocrine Society clinical practice    guideline. JCEM. 2011 Jul; 96(7):1911-30.   B12 and Folate Panel     Status: Abnormal   Collection Time: 02/10/23 10:16 AM  Result Value Ref Range   Vitamin B-12 >2000 (H) 232 - 1245 pg/mL   Folate 7.2 >3.0 ng/mL    Comment: A serum folate concentration of less than 3.1 ng/mL  is considered to represent clinical deficiency.         Assessment/Plan: 1. Encounter for general adult medical examination with abnormal findings CPE performed, labs reviewed, will schedule mammogram and plan for pap in future--declines today  2. Prediabetes Improving on medication, continue ozempic as before  3. Low TSH level Will check thyroid US, does have known mass adjacent to thyroid. Will recheck labs in future for monitoring - US THYROID; Future  4. Hypercholesterolemia Will work on diet and exercise and may start fish oil supplement  5. Dysuria - UA/M w/rflx Culture, Routine   General Counseling: Kendre verbalizes understanding of the findings of todays visit and agrees with plan of treatment. I have discussed any further diagnostic evaluation that may be needed or ordered today. We also reviewed her medications today. she has been encouraged to call the office with any questions or concerns that should arise related to todays visit.    Counseling:    Orders Placed This Encounter  Procedures   US THYROID   UA/M w/rflx Culture, Routine    No orders of the defined types were placed in this encounter.   This patient was seen by Lynn Ito, PA-C in collaboration with Dr. Beverely Risen as a part of collaborative care agreement.  Total time spent:35 Minutes  Time spent includes review of chart, medications, test results, and follow up plan with the patient.     Lyndon Code, MD  Internal Medicine

## 2023-02-17 LAB — UA/M W/RFLX CULTURE, ROUTINE
Bilirubin, UA: NEGATIVE
Protein,UA: NEGATIVE
Specific Gravity, UA: >=1.03 — AB (ref 1.005–1.030)
pH, UA: 5.5 (ref 5.0–7.5)

## 2023-02-17 LAB — MICROSCOPIC EXAMINATION

## 2023-02-19 LAB — MICROSCOPIC EXAMINATION
Bacteria, UA: NONE SEEN
Casts: NONE SEEN /lpf

## 2023-02-19 LAB — URINE CULTURE, REFLEX

## 2023-02-19 LAB — UA/M W/RFLX CULTURE, ROUTINE
Glucose, UA: NEGATIVE
Ketones, UA: NEGATIVE
Nitrite, UA: NEGATIVE
RBC, UA: NEGATIVE
Urobilinogen, Ur: 1 mg/dL (ref 0.2–1.0)

## 2023-02-24 ENCOUNTER — Ambulatory Visit
Admission: RE | Admit: 2023-02-24 | Discharge: 2023-02-24 | Disposition: A | Discharge status: Home / Self Care | Payer: BC Managed Care – PPO | Source: Ambulatory Visit | Attending: Physician Assistant | Admitting: Physician Assistant

## 2023-02-24 DIAGNOSIS — R7989 Other specified abnormal findings of blood chemistry: Secondary | ICD-10-CM | POA: Insufficient documentation

## 2023-02-26 ENCOUNTER — Telehealth: Payer: Self-pay

## 2023-02-26 NOTE — Telephone Encounter (Signed)
Pt.notified

## 2023-02-26 NOTE — Telephone Encounter (Signed)
-----   Message from Carlean Jews, PA-C sent at 02/25/2023  3:17 PM EDT ----- Please let her know that her thyroid does have several nodules and cysts, but these do not require any further imaging or biopsies to be done. We will just need to monitor thyroid labs in a few months

## 2023-03-09 ENCOUNTER — Other Ambulatory Visit: Payer: Self-pay | Admitting: Physician Assistant

## 2023-03-20 ENCOUNTER — Telehealth: Payer: Self-pay | Admitting: Physician Assistant

## 2023-03-20 NOTE — Telephone Encounter (Signed)
183 pages of MR faxed to [facility]; [phone]

## 2023-03-23 ENCOUNTER — Other Ambulatory Visit: Payer: Self-pay | Admitting: Physician Assistant

## 2023-03-23 DIAGNOSIS — Z9882 Breast implant status: Secondary | ICD-10-CM

## 2023-03-23 DIAGNOSIS — Q8500 Neurofibromatosis, unspecified: Secondary | ICD-10-CM

## 2023-03-23 DIAGNOSIS — N644 Mastodynia: Secondary | ICD-10-CM

## 2023-03-23 DIAGNOSIS — B379 Candidiasis, unspecified: Secondary | ICD-10-CM

## 2023-03-23 DIAGNOSIS — L601 Onycholysis: Secondary | ICD-10-CM

## 2023-03-23 DIAGNOSIS — D509 Iron deficiency anemia, unspecified: Secondary | ICD-10-CM

## 2023-03-23 DIAGNOSIS — G43909 Migraine, unspecified, not intractable, without status migrainosus: Secondary | ICD-10-CM

## 2023-03-23 DIAGNOSIS — K219 Gastro-esophageal reflux disease without esophagitis: Secondary | ICD-10-CM

## 2023-03-23 DIAGNOSIS — Z0001 Encounter for general adult medical examination with abnormal findings: Secondary | ICD-10-CM

## 2023-03-23 DIAGNOSIS — E7800 Pure hypercholesterolemia, unspecified: Secondary | ICD-10-CM

## 2023-03-23 DIAGNOSIS — R300 Dysuria: Secondary | ICD-10-CM

## 2023-03-23 DIAGNOSIS — G894 Chronic pain syndrome: Secondary | ICD-10-CM

## 2023-03-24 ENCOUNTER — Other Ambulatory Visit: Payer: Self-pay

## 2023-03-24 DIAGNOSIS — G894 Chronic pain syndrome: Secondary | ICD-10-CM

## 2023-03-24 DIAGNOSIS — G43909 Migraine, unspecified, not intractable, without status migrainosus: Secondary | ICD-10-CM

## 2023-03-24 DIAGNOSIS — N644 Mastodynia: Secondary | ICD-10-CM

## 2023-03-24 DIAGNOSIS — D509 Iron deficiency anemia, unspecified: Secondary | ICD-10-CM

## 2023-03-24 DIAGNOSIS — L601 Onycholysis: Secondary | ICD-10-CM

## 2023-03-24 DIAGNOSIS — Z0001 Encounter for general adult medical examination with abnormal findings: Secondary | ICD-10-CM

## 2023-03-24 DIAGNOSIS — Q8500 Neurofibromatosis, unspecified: Secondary | ICD-10-CM

## 2023-03-24 DIAGNOSIS — R300 Dysuria: Secondary | ICD-10-CM

## 2023-03-24 DIAGNOSIS — K219 Gastro-esophageal reflux disease without esophagitis: Secondary | ICD-10-CM

## 2023-03-24 DIAGNOSIS — Z9882 Breast implant status: Secondary | ICD-10-CM

## 2023-03-24 DIAGNOSIS — E7800 Pure hypercholesterolemia, unspecified: Secondary | ICD-10-CM

## 2023-03-24 MED ORDER — SUMATRIPTAN SUCCINATE 100 MG PO TABS
ORAL_TABLET | ORAL | 3 refills | Status: DC
Start: 2023-03-24 — End: 2023-08-26

## 2023-03-26 ENCOUNTER — Other Ambulatory Visit: Payer: Self-pay | Admitting: Physician Assistant

## 2023-03-26 DIAGNOSIS — B379 Candidiasis, unspecified: Secondary | ICD-10-CM

## 2023-04-06 ENCOUNTER — Telehealth: Payer: Self-pay

## 2023-04-06 ENCOUNTER — Other Ambulatory Visit: Payer: Self-pay

## 2023-04-06 DIAGNOSIS — Q8501 Neurofibromatosis, type 1: Secondary | ICD-10-CM

## 2023-04-06 NOTE — Telephone Encounter (Signed)
MRI breast denied by insurance. Will switch to bilat screening mammogram per MD. Please schedule and inform pt of appt.   Mri breast scheduled on 7/5 will need to be cancelled

## 2023-04-10 ENCOUNTER — Ambulatory Visit: Payer: BC Managed Care – PPO

## 2023-04-14 ENCOUNTER — Ambulatory Visit (INDEPENDENT_AMBULATORY_CARE_PROVIDER_SITE_OTHER): Payer: BC Managed Care – PPO | Admitting: Dermatology

## 2023-04-14 VITALS — BP 97/63

## 2023-04-14 DIAGNOSIS — L820 Inflamed seborrheic keratosis: Secondary | ICD-10-CM

## 2023-04-14 DIAGNOSIS — D179 Benign lipomatous neoplasm, unspecified: Secondary | ICD-10-CM

## 2023-04-14 DIAGNOSIS — D1730 Benign lipomatous neoplasm of skin and subcutaneous tissue of unspecified sites: Secondary | ICD-10-CM | POA: Diagnosis not present

## 2023-04-14 DIAGNOSIS — S01312A Laceration without foreign body of left ear, initial encounter: Secondary | ICD-10-CM

## 2023-04-14 DIAGNOSIS — L720 Epidermal cyst: Secondary | ICD-10-CM

## 2023-04-14 DIAGNOSIS — H61112 Acquired deformity of pinna, left ear: Secondary | ICD-10-CM

## 2023-04-14 DIAGNOSIS — E882 Lipomatosis, not elsewhere classified: Secondary | ICD-10-CM

## 2023-04-14 DIAGNOSIS — L729 Follicular cyst of the skin and subcutaneous tissue, unspecified: Secondary | ICD-10-CM

## 2023-04-14 DIAGNOSIS — Z7189 Other specified counseling: Secondary | ICD-10-CM

## 2023-04-14 NOTE — Progress Notes (Signed)
   Follow-Up Visit   Subjective  Andrea Bernard is a 57 y.o. female who presents for the following: She would like her left earlobe checked. It seems to get infected sometimes. Years ago, her grandson pulled her earring and stretched her piercing site. She has an itchy spot on her right lower leg that she would like checked.  The following portions of the chart were reviewed this encounter and updated as appropriate: medications, allergies, medical history  Review of Systems:  No other skin or systemic complaints except as noted in HPI or Assessment and Plan.  Objective  Well appearing patient in no apparent distress; mood and affect are within normal limits. A focused examination was performed of the following areas: arms, legs, face, ears Relevant exam findings are noted in the Assessment and Plan.  Right medial calf x 2, left thigh x 1, right thigh x 1 (4) Erythematous stuck-on, waxy papule or plaque   Assessment & Plan   EPIDERMAL INCLUSION CYST Small - 0.5 cm of Left earlobe inferior tip adjacent to inferior end of elongated ear pierce site. Exam: Subcutaneous nodule at left earlobe with evident open pore with surrounding inflammation  Benign-appearing. Exam most consistent with an epidermal inclusion cyst. Discussed that a cyst is a benign growth that can grow over time and sometimes get irritated or inflamed. Recommend observation if it is not bothersome. Discussed option of surgical excision to remove it if it is growing, symptomatic, or other changes noted. Please call for new or changing lesions so they can be evaluated.   Elongated Ear Piercing Site of left earlobe Discussed ear pierce site laceration / elongation repair with Dr Arita Miss possibly in conjunction with earlobe cyst excision. Treatment Plan: Recommend evaluation with Dr. Arita Miss   Lipomas of skin Benign hereditary Lipomatosis History of multiple Lipomas excised by Dr Gwen Pounds All current lipomas  asymptomatic  Inflamed seborrheic keratosis (4) Right medial calf x 2, left thigh x 1, right thigh x 1  Symptomatic, irritating, patient would like treated.  Benign-appearing.  Call clinic for new or changing lesions.    Destruction of lesion - Right medial calf x 2, left thigh x 1, right thigh x 1 (4) Complexity: simple   Destruction method: cryotherapy   Informed consent: discussed and consent obtained   Timeout:  patient name, date of birth, surgical site, and procedure verified Lesion destroyed using liquid nitrogen: Yes   Region frozen until ice ball extended beyond lesion: Yes   Outcome: patient tolerated procedure well with no complications   Post-procedure details: wound care instructions given    Torn earlobe, left, initial encounter  Related Procedures Ambulatory referral to Plastic Surgery  Counseling and coordination of care  Cyst of skin   Return if symptoms worsen or fail to improve.  I, Joanie Coddington, CMA, am acting as scribe for Armida Sans, MD .  Documentation: I have reviewed the above documentation for accuracy and completeness, and I agree with the above.  Armida Sans, MD

## 2023-04-14 NOTE — Patient Instructions (Signed)
Cryotherapy Aftercare  Wash gently with soap and water everyday.   Apply Vaseline and Band-Aid daily until healed.    Wound Care Instructions  Cleanse wound gently with soap and water once a day then pat dry with clean gauze. Apply a thin coat of Petrolatum (petroleum jelly, "Vaseline") over the wound (unless you have an allergy to this). We recommend that you use a new, sterile tube of Vaseline. Do not pick or remove scabs. Do not remove the yellow or white "healing tissue" from the base of the wound.  Cover the wound with fresh, clean, nonstick gauze and secure with paper tape. You may use Band-Aids in place of gauze and tape if the wound is small enough, but would recommend trimming much of the tape off as there is often too much. Sometimes Band-Aids can irritate the skin.  You should call the office for your biopsy report after 1 week if you have not already been contacted.  If you experience any problems, such as abnormal amounts of bleeding, swelling, significant bruising, significant pain, or evidence of infection, please call the office immediately.  FOR ADULT SURGERY PATIENTS: If you need something for pain relief you may take 1 extra strength Tylenol (acetaminophen) AND 2 Ibuprofen (200mg each) together every 4 hours as needed for pain. (do not take these if you are allergic to them or if you have a reason you should not take them.) Typically, you may only need pain medication for 1 to 3 days.    

## 2023-04-17 ENCOUNTER — Encounter: Payer: Self-pay | Admitting: Dermatology

## 2023-04-21 ENCOUNTER — Other Ambulatory Visit: Payer: Self-pay | Admitting: Physician Assistant

## 2023-04-21 ENCOUNTER — Telehealth: Payer: Self-pay

## 2023-04-21 DIAGNOSIS — G43909 Migraine, unspecified, not intractable, without status migrainosus: Secondary | ICD-10-CM

## 2023-04-21 DIAGNOSIS — N644 Mastodynia: Secondary | ICD-10-CM

## 2023-04-21 DIAGNOSIS — R7303 Prediabetes: Secondary | ICD-10-CM

## 2023-04-21 DIAGNOSIS — K219 Gastro-esophageal reflux disease without esophagitis: Secondary | ICD-10-CM

## 2023-04-21 DIAGNOSIS — E7800 Pure hypercholesterolemia, unspecified: Secondary | ICD-10-CM

## 2023-04-21 DIAGNOSIS — D509 Iron deficiency anemia, unspecified: Secondary | ICD-10-CM

## 2023-04-21 DIAGNOSIS — Q8500 Neurofibromatosis, unspecified: Secondary | ICD-10-CM

## 2023-04-21 DIAGNOSIS — Z9882 Breast implant status: Secondary | ICD-10-CM

## 2023-04-21 DIAGNOSIS — L601 Onycholysis: Secondary | ICD-10-CM

## 2023-04-21 DIAGNOSIS — Z0001 Encounter for general adult medical examination with abnormal findings: Secondary | ICD-10-CM

## 2023-04-21 DIAGNOSIS — G894 Chronic pain syndrome: Secondary | ICD-10-CM

## 2023-04-21 DIAGNOSIS — R300 Dysuria: Secondary | ICD-10-CM

## 2023-04-21 NOTE — Telephone Encounter (Signed)
 Please do PA

## 2023-04-21 NOTE — Telephone Encounter (Signed)
Completed P.A. for patient's Ozempic. 

## 2023-04-22 ENCOUNTER — Other Ambulatory Visit: Payer: Self-pay | Admitting: Physician Assistant

## 2023-05-05 ENCOUNTER — Other Ambulatory Visit: Payer: Self-pay

## 2023-05-05 DIAGNOSIS — S01311A Laceration without foreign body of right ear, initial encounter: Secondary | ICD-10-CM

## 2023-05-06 ENCOUNTER — Other Ambulatory Visit: Payer: Self-pay

## 2023-05-06 DIAGNOSIS — R7303 Prediabetes: Secondary | ICD-10-CM

## 2023-05-06 MED ORDER — OZEMPIC (0.25 OR 0.5 MG/DOSE) 2 MG/3ML ~~LOC~~ SOPN
PEN_INJECTOR | SUBCUTANEOUS | 3 refills | Status: DC
Start: 2023-05-06 — End: 2024-02-22

## 2023-05-14 ENCOUNTER — Other Ambulatory Visit: Payer: Self-pay | Admitting: Physician Assistant

## 2023-05-14 DIAGNOSIS — Z9882 Breast implant status: Secondary | ICD-10-CM

## 2023-05-14 DIAGNOSIS — D509 Iron deficiency anemia, unspecified: Secondary | ICD-10-CM

## 2023-05-14 DIAGNOSIS — L601 Onycholysis: Secondary | ICD-10-CM

## 2023-05-14 DIAGNOSIS — G43909 Migraine, unspecified, not intractable, without status migrainosus: Secondary | ICD-10-CM

## 2023-05-14 DIAGNOSIS — Q8500 Neurofibromatosis, unspecified: Secondary | ICD-10-CM

## 2023-05-14 DIAGNOSIS — E7800 Pure hypercholesterolemia, unspecified: Secondary | ICD-10-CM

## 2023-05-14 DIAGNOSIS — K219 Gastro-esophageal reflux disease without esophagitis: Secondary | ICD-10-CM

## 2023-05-14 DIAGNOSIS — Z0001 Encounter for general adult medical examination with abnormal findings: Secondary | ICD-10-CM

## 2023-05-14 DIAGNOSIS — N644 Mastodynia: Secondary | ICD-10-CM

## 2023-05-14 DIAGNOSIS — R300 Dysuria: Secondary | ICD-10-CM

## 2023-05-14 DIAGNOSIS — G894 Chronic pain syndrome: Secondary | ICD-10-CM

## 2023-05-14 NOTE — Telephone Encounter (Signed)
Ok to send 54 tab ?

## 2023-06-04 ENCOUNTER — Other Ambulatory Visit: Payer: Self-pay | Admitting: Physician Assistant

## 2023-06-04 ENCOUNTER — Telehealth: Payer: Self-pay

## 2023-06-04 DIAGNOSIS — R7303 Prediabetes: Secondary | ICD-10-CM

## 2023-06-04 NOTE — Telephone Encounter (Signed)
Spoke with pt that insurance  Denied Ozempic as per lauren advised that we recheck A1c at next visit and also she is on metformin so will discuss in detail at next week

## 2023-06-16 ENCOUNTER — Ambulatory Visit: Payer: BC Managed Care – PPO | Admitting: Dermatology

## 2023-06-19 ENCOUNTER — Ambulatory Visit (INDEPENDENT_AMBULATORY_CARE_PROVIDER_SITE_OTHER): Payer: BC Managed Care – PPO | Admitting: Physician Assistant

## 2023-06-19 ENCOUNTER — Encounter: Payer: Self-pay | Admitting: Physician Assistant

## 2023-06-19 VITALS — BP 105/65 | HR 67 | Temp 97.7°F | Resp 16 | Ht 64.0 in | Wt 144.4 lb

## 2023-06-19 DIAGNOSIS — E042 Nontoxic multinodular goiter: Secondary | ICD-10-CM | POA: Diagnosis not present

## 2023-06-19 DIAGNOSIS — R7303 Prediabetes: Secondary | ICD-10-CM | POA: Diagnosis not present

## 2023-06-19 DIAGNOSIS — R7989 Other specified abnormal findings of blood chemistry: Secondary | ICD-10-CM

## 2023-06-19 LAB — POCT GLYCOSYLATED HEMOGLOBIN (HGB A1C): Hemoglobin A1C: 5.3 % (ref 4.0–5.6)

## 2023-06-19 NOTE — Progress Notes (Signed)
[facility] [address] [place], [place] [address]  Internal MEDICINE  Office Visit Note  Patient Name: Andrea Bernard  [id number]  [id number]  Date of Service: 06/19/2023  Chief Complaint  Patient presents with   Follow-up   Gastroesophageal Reflux    HPI Pt is here for routine follow up -has been off of ozempic for 2 weeks, and started to not feel well, starting to have joint pain again. Had been feeling so much better. Was able to exercise again. -had stopped metformin to see what sugar was doing will start back now as she was told this could help stop tumor growth per dermatology and this alone may help make a difference in how she feels now that her insurance will not approve ozempic any longer -It is also raining today which also makes her joints hurt more as well -BMI in ideal range and A1c normal -thyroid US reviewed: enlarged multinodular goiter, no individual nodule meets criteria for biopsy of further surveillance, No follow up recommended.  Current Medication: Outpatient Encounter Medications as of 06/19/2023  Medication Sig   albuterol (VENTOLIN HFA) 108 (90 Base) MCG/ACT inhaler INHALE 2 PUFFS BY MOUTH EVERY 6 HOURS AS NEEDED FOR WHEEZE OR SHORTNESS OF BREATH   gabapentin (NEURONTIN) 300 MG capsule Take 300 mg by mouth 3 (three) times daily.   ibuprofen (ADVIL) 200 MG tablet Take 200 mg by mouth as needed.   metFORMIN (GLUCOPHAGE-XR) 500 MG 24 hr tablet TAKE 1 TABLET BY MOUTH DAILY WITH SUPPER.   OXYCONTIN 80 MG 12 hr tablet Take 80 mg by mouth every 12 (twelve) hours.   pantoprazole (PROTONIX) 40 MG tablet TAKE 1 TABLET BY MOUTH TWICE A DAY   Semaglutide,0.25 or 0.5MG /DOS, (OZEMPIC, 0.25 OR 0.5 MG/DOSE,) 2 MG/3ML SOPN INJECT 0.25MG  INTO THE SKIN ONCE A WEEK FOR 4 WEEKS THEN INCREASE TO 0.5MG  WEEKLY.   SUMAtriptan (IMITREX) 100 MG tablet May repeat in 2 hours if headache persists or recurs.   Facility-Administered Encounter Medications as of 06/19/2023   Medication   iron sucrose (VENOFER) 200 mg IVPB   sodium chloride flush (NS) 0.9 % injection 10 mL    Surgical History: Past Surgical History:  Procedure Laterality Date   AUGMENTATION MAMMAPLASTY Bilateral 12/10/2020   BREAST ENHANCEMENT SURGERY Bilateral 12/04/2020   carpel tunnel release Bilateral    CESAREAN SECTION     x 4   COLONOSCOPY WITH PROPOFOL N/A 09/04/2017   Procedure: COLONOSCOPY WITH PROPOFOL;  Surgeon: Wyline Mood, MD;  Location: [facility] ENDOSCOPY;  Service: Gastroenterology;  Laterality: N/A;   COLONOSCOPY WITH PROPOFOL N/A 12/21/2017   Procedure: COLONOSCOPY WITH PROPOFOL;  Surgeon: Wyline Mood, MD;  Location: [facility] ENDOSCOPY;  Service: Gastroenterology;  Laterality: N/A;   ESOPHAGOGASTRODUODENOSCOPY (EGD) WITH PROPOFOL N/A 09/04/2017   Procedure: ESOPHAGOGASTRODUODENOSCOPY (EGD) WITH PROPOFOL;  Surgeon: Wyline Mood, MD;  Location: [facility] ENDOSCOPY;  Service: Gastroenterology;  Laterality: N/A;   HERNIA REPAIR     HERNIA REPAIR  12/04/2020   TUBAL LIGATION     tummy tuck  12/04/2020   removed 20 masses on stomach   TUMOR REMOVAL     x8    Medical History: Past Medical History:  Diagnosis Date   Arthritis    Dysplastic nevus 03/02/2008   Right lateral thigh. Moderate atypia, limited margins free.   Dysplastic nevus 01/27/2023   Right posterior forearm. Moderate atypia, close to margin.   GERD (gastroesophageal reflux disease)    Headache    Iron deficiency anemia 06/15/2017  Neurofibromatosis (HCC)     Family History: Family History  Problem Relation Age of Onset   Anemia Mother    Lupus Mother    Sjogren's syndrome Mother    Rheum arthritis Mother    Schizophrenia Father    Neurofibromatosis Brother    Neurofibromatosis Son    Neurofibromatosis Son    Neurofibromatosis Daughter    Bladder Cancer Neg Hx    Kidney cancer Neg Hx    Breast cancer Neg Hx    Ovarian cancer Neg Hx    Colon cancer Neg Hx     Social History   Socioeconomic History    Marital status: Married    Spouse name: Not on file   Number of children: Not on file   Years of education: Not on file   Highest education level: Not on file  Occupational History   Not on file  Tobacco Use   Smoking status: Former    Types: Cigarettes   Smokeless tobacco: Never   Tobacco comments:    Age [age]  Vaping Use   Vaping status: Never Used  Substance and Sexual Activity   Alcohol use: No   Drug use: Not Currently   Sexual activity: Yes    Birth control/protection: Surgical  Other Topics Concern   Not on file  Social History Narrative   Not on file   Social Determinants of Health   Financial Resource Strain: Not on file  Food Insecurity: Not on file  Transportation Needs: Not on file  Physical Activity: Not on file  Stress: Not on file  Social Connections: Not on file  Intimate Partner Violence: Not on file      Review of Systems  Constitutional:  Negative for chills, fatigue and unexpected weight change.  HENT:  Negative for congestion, rhinorrhea, sneezing and sore throat.   Eyes:  Negative for redness.  Respiratory:  Negative for cough, chest tightness and shortness of breath.   Cardiovascular:  Negative for chest pain and palpitations.  Gastrointestinal:  Negative for abdominal pain, constipation, diarrhea, nausea and vomiting.  Genitourinary:  Negative for dysuria and frequency.  Musculoskeletal:  Positive for arthralgias. Negative for back pain, joint swelling and neck pain.  Skin:  Negative for rash.  Neurological: Negative.  Negative for tremors and numbness.  Hematological:  Negative for adenopathy. Does not bruise/bleed easily.  Psychiatric/Behavioral:  Negative for behavioral problems (Depression), sleep disturbance and suicidal ideas. The patient is not nervous/anxious.     Vital Signs: BP 105/65   Pulse 67   Temp 97.7 F (36.5 C)   Resp 16   Ht 5\' 4"  (1.626 m)   Wt 144 lb 6.4 oz (65.5 kg)   LMP 10/06/2018   SpO2 98%   BMI 24.79  kg/m    Physical Exam Vitals and nursing note reviewed.  Constitutional:      General: She is not in acute distress.    Appearance: Normal appearance. She is well-developed. She is not diaphoretic.  HENT:     Head: Normocephalic and atraumatic.     Mouth/Throat:     Pharynx: No oropharyngeal exudate.  Eyes:     Pupils: Pupils are equal, round, and reactive to light.  Neck:     Thyroid: No thyromegaly.     Vascular: No JVD.     Trachea: No tracheal deviation.  Cardiovascular:     Rate and Rhythm: Normal rate and regular rhythm.     Heart sounds: Normal heart sounds. No murmur heard.  No friction rub. No gallop.  Pulmonary:     Effort: Pulmonary effort is normal. No respiratory distress.     Breath sounds: No wheezing or rales.  Chest:     Chest wall: No tenderness.  Breasts:    Right: Normal. No mass.     Left: No mass.  Abdominal:     General: Bowel sounds are normal.     Palpations: Abdomen is soft.  Musculoskeletal:        General: Normal range of motion.     Cervical back: Normal range of motion and neck supple.  Lymphadenopathy:     Cervical: No cervical adenopathy.  Skin:    General: Skin is warm and dry.  Neurological:     Mental Status: She is alert and oriented to person, place, and time.     Cranial Nerves: No cranial nerve deficit.  Psychiatric:        Behavior: Behavior normal.        Thought Content: Thought content normal.        Judgment: Judgment normal.        Assessment/Plan: 1. Prediabetes - POCT HgB A1C is 5.3 and is in normal range. No longer qualifies for ozempic per her insurance and will restart her daily metformin per dermatology to help slow tumor growth. Continue to monitor  2. Multinodular goiter [place] with multiple nodules not requiring any follow up  3. Low TSH level Will monitor - TSH + free T4   General Counseling: Manasvini verbalizes understanding of the findings of todays visit and agrees with plan of treatment. I have  discussed any further diagnostic evaluation that may be needed or ordered today. We also reviewed her medications today. she has been encouraged to call the office with any questions or concerns that should arise related to todays visit.    Orders Placed This Encounter  Procedures   TSH + free T4   POCT HgB A1C    No orders of the defined types were placed in this encounter.   This patient was seen by Lynn Ito, PA-C in collaboration with Dr. Beverely Risen as a part of collaborative care agreement.   Total time spent:30 Minutes Time spent includes review of chart, medications, test results, and follow up plan with the patient.      Dr Lyndon Code Internal medicine

## 2023-06-20 LAB — TSH+FREE T4
Free T4: 1.29 ng/dL (ref 0.82–1.77)
TSH: 0.585 u[IU]/mL (ref 0.450–4.500)

## 2023-06-22 ENCOUNTER — Telehealth: Payer: Self-pay

## 2023-06-22 NOTE — Telephone Encounter (Signed)
-----   Message from Carlean Jews sent at 06/22/2023  1:03 PM EDT ----- Please let her know that her TSH was back to normal on labs

## 2023-06-22 NOTE — Telephone Encounter (Signed)
Spoke with patient regarding lab results.

## 2023-07-02 ENCOUNTER — Other Ambulatory Visit: Payer: Self-pay | Admitting: Physician Assistant

## 2023-07-27 ENCOUNTER — Inpatient Hospital Stay: Payer: BC Managed Care – PPO | Attending: Oncology

## 2023-07-27 DIAGNOSIS — Z87891 Personal history of nicotine dependence: Secondary | ICD-10-CM | POA: Insufficient documentation

## 2023-07-27 DIAGNOSIS — D509 Iron deficiency anemia, unspecified: Secondary | ICD-10-CM | POA: Insufficient documentation

## 2023-07-27 DIAGNOSIS — Q8501 Neurofibromatosis, type 1: Secondary | ICD-10-CM | POA: Insufficient documentation

## 2023-07-29 ENCOUNTER — Inpatient Hospital Stay: Payer: BC Managed Care – PPO | Admitting: Oncology

## 2023-07-29 ENCOUNTER — Inpatient Hospital Stay: Payer: BC Managed Care – PPO

## 2023-07-29 ENCOUNTER — Encounter: Payer: Self-pay | Admitting: Oncology

## 2023-07-29 VITALS — BP 98/68 | HR 60 | Temp 96.0°F | Resp 18 | Wt 145.3 lb

## 2023-07-29 DIAGNOSIS — Q8501 Neurofibromatosis, type 1: Secondary | ICD-10-CM

## 2023-07-29 DIAGNOSIS — D508 Other iron deficiency anemias: Secondary | ICD-10-CM

## 2023-07-29 DIAGNOSIS — Z87891 Personal history of nicotine dependence: Secondary | ICD-10-CM | POA: Diagnosis not present

## 2023-07-29 DIAGNOSIS — D509 Iron deficiency anemia, unspecified: Secondary | ICD-10-CM | POA: Diagnosis present

## 2023-07-29 LAB — IRON AND TIBC
Iron: 59 ug/dL (ref 28–170)
Saturation Ratios: 23 % (ref 10.4–31.8)
TIBC: 256 ug/dL (ref 250–450)
UIBC: 197 ug/dL

## 2023-07-29 LAB — CBC WITH DIFFERENTIAL (CANCER CENTER ONLY)
Abs Immature Granulocytes: 0.01 10*3/uL (ref 0.00–0.07)
Basophils Absolute: 0.1 10*3/uL (ref 0.0–0.1)
Basophils Relative: 1 %
Eosinophils Absolute: 0.4 10*3/uL (ref 0.0–0.5)
Eosinophils Relative: 6 %
HCT: 41.6 % (ref 36.0–46.0)
Hemoglobin: 13.5 g/dL (ref 12.0–15.0)
Immature Granulocytes: 0 %
Lymphocytes Relative: 43 %
Lymphs Abs: 2.5 10*3/uL (ref 0.7–4.0)
MCH: 28.5 pg (ref 26.0–34.0)
MCHC: 32.5 g/dL (ref 30.0–36.0)
MCV: 87.9 fL (ref 80.0–100.0)
Monocytes Absolute: 0.5 10*3/uL (ref 0.1–1.0)
Monocytes Relative: 9 %
Neutro Abs: 2.4 10*3/uL (ref 1.7–7.7)
Neutrophils Relative %: 41 %
Platelet Count: 215 10*3/uL (ref 150–400)
RBC: 4.73 MIL/uL (ref 3.87–5.11)
RDW: 11.6 % (ref 11.5–15.5)
WBC Count: 5.9 10*3/uL (ref 4.0–10.5)
nRBC: 0 % (ref 0.0–0.2)

## 2023-07-29 LAB — RETIC PANEL
Immature Retic Fract: 3 % (ref 2.3–15.9)
RBC.: 4.67 MIL/uL (ref 3.87–5.11)
Retic Count, Absolute: 45.8 10*3/uL (ref 19.0–186.0)
Retic Ct Pct: 1 % (ref 0.4–3.1)
Reticulocyte Hemoglobin: 31.7 pg (ref >27.9)

## 2023-07-29 LAB — FERRITIN: Ferritin: 39 ng/mL (ref 11–307)

## 2023-07-29 NOTE — Assessment & Plan Note (Addendum)
Labs reviewed and discussed with patient Iron level and hemoglobin have both improved.  Lab Results  Component Value Date   HGB 13.5 07/29/2023   TIBC 197 (L) 02/10/2023   IRONPCTSAT 28 02/10/2023   FERRITIN 120 02/10/2023    No need for IV Venofer.

## 2023-07-29 NOTE — Progress Notes (Signed)
Hematology/Oncology Progress note [phone] [phone]     CHIEF COMPLAINTS/REASON FOR VISIT:  Anemia, history of NF1  ASSESSMENT & PLAN:   Iron deficiency anemia Labs reviewed and discussed with patient Iron level and hemoglobin have both improved.  Lab Results  Component Value Date   HGB 13.5 07/29/2023   TIBC 197 (L) 02/10/2023   IRONPCTSAT 28 02/10/2023   FERRITIN 120 02/10/2023    No need for IV Venofer.    Type 1 neurofibromatosis (HCC) Continue follow-up with neurology. 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 Recommend annual mammogram- she is over due. She plans to ask PCP to order.  Annual MRI for breast cancer screening was denied by insurance.     Orders Placed This Encounter  Procedures   CBC with Differential (Cancer Center Only)    Standing Status:   Future    Standing Expiration Date:   07/28/2024   Iron and TIBC    Standing Status:   Future    Standing Expiration Date:   07/28/2024   Ferritin    Standing Status:   Future    Standing Expiration Date:   07/28/2024   Retic Panel    Standing Status:   Future    Standing Expiration Date:   07/28/2024   Patient prefers to continue follow up annually.  All questions were answered. The patient knows to call the clinic with any problems, questions or concerns.  Rickard Patience, MD, PhD [facility] Hematology Oncology 07/29/2023     HISTORY OF PRESENTING ILLNESS:  Andrea Bernard is a  57 y.o.  female with PMH listed below who was referred to me for anemia Reviewed patient's recent labs that was done.  She was found to have abnormal CBC on 02/18/2022, hemoglobin 11.2.  MCV 81.  Iron panel showed iron saturation 11, ferritin 7.  Patient was previously seen by me 4 years ago for iron deficiency anemia and previously tolerated IV Venofer treatments. She is postmenopausal, LMP January 2020.  She is not able to tolerate oral iron supplementation. + Fatigue,  shortness of breath with exertion. She denies recent chest pain on exertion, pre-syncopal episodes, or palpitations She had not noticed any recent bleeding such as epistaxis, hematuria or hematochezia.  Patient uses over-the-counter NSAIDs as needed for arthritis. Her last colonoscopy was 2019. She denies any pica and eats a variety of diet.   INTERVAL HISTORY Andrea Bernard is a 57 y.o. female who has above history reviewed by me today presents for follow up visit for iron deficiency anemia, history of NF1 No new complaints. She feels well.  She feels previous Ozempic has improved her chronic pain due to arthritis. However currently medications is not covered and she starts to have more pain. She tries to avoid NSAIDS except when pain is severe.   MEDICAL HISTORY:  Past Medical History:  Diagnosis Date   Arthritis    Dysplastic nevus 03/02/2008   Right lateral thigh. Moderate atypia, limited margins free.   Dysplastic nevus 01/27/2023   Right posterior forearm. Moderate atypia, close to margin.   GERD (gastroesophageal reflux disease)    Headache    Iron deficiency anemia 06/15/2017   Neurofibromatosis [facility])     SURGICAL HISTORY: Past Surgical History:  Procedure Laterality Date   AUGMENTATION MAMMAPLASTY Bilateral 12/10/2020   BREAST ENHANCEMENT SURGERY Bilateral 12/04/2020   carpel tunnel release Bilateral    CESAREAN SECTION     x 4   COLONOSCOPY  WITH PROPOFOL N/A 09/04/2017   Procedure: COLONOSCOPY WITH PROPOFOL;  Surgeon: Wyline Mood, MD;  Location: [facility] ENDOSCOPY;  Service: Gastroenterology;  Laterality: N/A;   COLONOSCOPY WITH PROPOFOL N/A 12/21/2017   Procedure: COLONOSCOPY WITH PROPOFOL;  Surgeon: Wyline Mood, MD;  Location: [facility] ENDOSCOPY;  Service: Gastroenterology;  Laterality: N/A;   ESOPHAGOGASTRODUODENOSCOPY (EGD) WITH PROPOFOL N/A 09/04/2017   Procedure: ESOPHAGOGASTRODUODENOSCOPY (EGD) WITH PROPOFOL;  Surgeon: Wyline Mood, MD;  Location: [facility] ENDOSCOPY;   Service: Gastroenterology;  Laterality: N/A;   HERNIA REPAIR     HERNIA REPAIR  12/04/2020   TUBAL LIGATION     tummy tuck  12/04/2020   removed 20 masses on stomach   TUMOR REMOVAL     x8    SOCIAL HISTORY: Social History   Socioeconomic History   Marital status: Married    Spouse name: Not on file   Number of children: Not on file   Years of education: Not on file   Highest education level: Not on file  Occupational History   Not on file  Tobacco Use   Smoking status: Former    Types: Cigarettes   Smokeless tobacco: Never   Tobacco comments:    Age [age]  Vaping Use   Vaping status: Never Used  Substance and Sexual Activity   Alcohol use: No   Drug use: Not Currently   Sexual activity: Yes    Birth control/protection: Surgical  Other Topics Concern   Not on file  Social History Narrative   Not on file   Social Determinants of Health   Financial Resource Strain: Not on file  Food Insecurity: Not on file  Transportation Needs: Not on file  Physical Activity: Not on file  Stress: Not on file  Social Connections: Not on file  Intimate Partner Violence: Not on file    FAMILY HISTORY: Family History  Problem Relation Age of Onset   Anemia Mother    Lupus Mother    Sjogren's syndrome Mother    Rheum arthritis Mother    Schizophrenia Father    Neurofibromatosis Brother    Neurofibromatosis Son    Neurofibromatosis Son    Neurofibromatosis Daughter    Bladder Cancer Neg Hx    Kidney cancer Neg Hx    Breast cancer Neg Hx    Ovarian cancer Neg Hx    Colon cancer Neg Hx     ALLERGIES:  is allergic to ciprofloxacin and nitrofuran derivatives.  MEDICATIONS:  Current Outpatient Medications  Medication Sig Dispense Refill   albuterol (VENTOLIN HFA) 108 (90 Base) MCG/ACT inhaler INHALE 2 PUFFS BY MOUTH EVERY 6 HOURS AS NEEDED FOR WHEEZE OR SHORTNESS OF BREATH 6.7 each 1   gabapentin (NEURONTIN) 300 MG capsule Take 300 mg by mouth 3 (three) times daily.  0    ibuprofen (ADVIL) 200 MG tablet Take 200 mg by mouth as needed.     metFORMIN (GLUCOPHAGE-XR) 500 MG 24 hr tablet TAKE 1 TABLET BY MOUTH DAILY WITH SUPPER. 90 tablet 3   OXYCONTIN 80 MG 12 hr tablet Take 80 mg by mouth every 12 (twelve) hours.  0   pantoprazole (PROTONIX) 40 MG tablet TAKE 1 TABLET BY MOUTH TWICE A DAY 180 tablet 1   Semaglutide,0.25 or 0.5MG /DOS, (OZEMPIC, 0.25 OR 0.5 MG/DOSE,) 2 MG/3ML SOPN INJECT 0.25MG  INTO THE SKIN ONCE A WEEK FOR 4 WEEKS THEN INCREASE TO 0.5MG  WEEKLY. 3 mL 3   SUMAtriptan (IMITREX) 100 MG tablet May repeat in 2 hours if headache persists or recurs. 9  tablet 3   No current facility-administered medications for this visit.   Facility-Administered Medications Ordered in Other Visits  Medication Dose Route Frequency Provider Last Rate Last Admin   iron sucrose (VENOFER) 200 mg IVPB  200 mg Intravenous Weekly Rickard Patience, MD       sodium chloride flush (NS) 0.9 % injection 10 mL  10 mL Intracatheter PRN Rickard Patience, MD        Review of Systems  Constitutional:  Negative for appetite change, chills, fatigue and fever.  HENT:   Negative for hearing loss and voice change.   Eyes:  Negative for eye problems.  Respiratory:  Negative for chest tightness and cough.   Cardiovascular:  Negative for chest pain.  Gastrointestinal:  Negative for abdominal distention, abdominal pain and blood in stool.  Endocrine: Negative for hot flashes.  Genitourinary:  Negative for difficulty urinating and frequency.   Musculoskeletal:  Positive for arthralgias.  Skin:  Negative for itching and rash.  Neurological:  Negative for extremity weakness.  Hematological:  Negative for adenopathy.  Psychiatric/Behavioral:  Negative for confusion.     PHYSICAL EXAMINATION: ECOG PERFORMANCE STATUS: 1 - Symptomatic but completely ambulatory Vitals:   07/29/23 1409  BP: 98/68  Pulse: 60  Resp: 18  Temp: (!) 96 F (35.6 C)   Filed Weights   07/29/23 1409  Weight: 145 lb 4.8 oz  (65.9 kg)    Physical Exam Constitutional:      General: She is not in acute distress. HENT:     Head: Normocephalic and atraumatic.  Eyes:     General: No scleral icterus. Cardiovascular:     Rate and Rhythm: Normal rate and regular rhythm.     Heart sounds: Normal heart sounds.  Pulmonary:     Effort: Pulmonary effort is normal. No respiratory distress.     Breath sounds: No wheezing.  Abdominal:     General: Bowel sounds are normal. There is no distension.     Palpations: Abdomen is soft.  Musculoskeletal:        General: No deformity. Normal range of motion.     Cervical back: Normal range of motion and neck supple.  Skin:    General: Skin is warm and dry.     Findings: No erythema or rash.  Neurological:     Mental Status: She is alert and oriented to person, place, and time. Mental status is at baseline.  Psychiatric:        Mood and Affect: Mood normal.      LABORATORY DATA:  I have reviewed the data as listed     Latest Ref Rng & Units 07/29/2023    1:53 PM 02/10/2023   10:16 AM 01/26/2023    8:53 AM  CBC  WBC 4.0 - 10.5 K/uL 5.9  6.5  6.7   Hemoglobin 12.0 - 15.0 g/dL 46.9  62.9  52.8   Hematocrit 36.0 - 46.0 % 41.6  37.8  42.6   Platelets 150 - 400 K/uL 215  264  182       Latest Ref Rng & Units 02/10/2023   10:16 AM 02/18/2022   11:43 AM 02/12/2021   11:15 PM  CMP  Glucose 70 - 99 mg/dL 82  96  413   BUN 6 - 24 mg/dL 15  12  16    Creatinine 0.57 - 1.00 mg/dL 2.44  0.10  2.72   Sodium 134 - 144 mmol/L 139  138  139   Potassium 3.5 -  5.2 mmol/L 4.8  4.8  4.1   Chloride 96 - 106 mmol/L 102  100  107   CO2 20 - 29 mmol/L 25  25  23    Calcium 8.7 - 10.2 mg/dL 9.2  9.4  8.6   Total Protein 6.0 - 8.5 g/dL 6.5  7.1  6.5   Total Bilirubin 0.0 - 1.2 mg/dL 0.3  0.4  0.7   Alkaline Phos 44 - 121 IU/L 48  98  64   AST 0 - 40 IU/L 20  22  34   ALT 0 - 32 IU/L 12  17  16     Lab Results  Component Value Date   IRON 59 07/29/2023   TIBC 256 07/29/2023    FERRITIN 39 07/29/2023       RADIOGRAPHIC STUDIES: I have personally reviewed the radiological images as listed and agreed with the findings in the report. No results found.

## 2023-07-29 NOTE — Assessment & Plan Note (Addendum)
Continue follow-up with neurology. 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 Recommend annual mammogram- she is over due. She plans to ask PCP to order.  Annual MRI for breast cancer screening was denied by insurance.

## 2023-07-31 ENCOUNTER — Other Ambulatory Visit: Payer: Self-pay | Admitting: Physician Assistant

## 2023-07-31 DIAGNOSIS — R7303 Prediabetes: Secondary | ICD-10-CM

## 2023-08-26 ENCOUNTER — Other Ambulatory Visit: Payer: Self-pay | Admitting: Physician Assistant

## 2023-08-26 ENCOUNTER — Other Ambulatory Visit: Payer: Self-pay

## 2023-08-26 DIAGNOSIS — G43909 Migraine, unspecified, not intractable, without status migrainosus: Secondary | ICD-10-CM

## 2023-08-26 DIAGNOSIS — Z9882 Breast implant status: Secondary | ICD-10-CM

## 2023-08-26 DIAGNOSIS — E7800 Pure hypercholesterolemia, unspecified: Secondary | ICD-10-CM

## 2023-08-26 DIAGNOSIS — Z0001 Encounter for general adult medical examination with abnormal findings: Secondary | ICD-10-CM

## 2023-08-26 DIAGNOSIS — K219 Gastro-esophageal reflux disease without esophagitis: Secondary | ICD-10-CM

## 2023-08-26 DIAGNOSIS — G894 Chronic pain syndrome: Secondary | ICD-10-CM

## 2023-08-26 DIAGNOSIS — L601 Onycholysis: Secondary | ICD-10-CM

## 2023-08-26 DIAGNOSIS — Q8500 Neurofibromatosis, unspecified: Secondary | ICD-10-CM

## 2023-08-26 DIAGNOSIS — D509 Iron deficiency anemia, unspecified: Secondary | ICD-10-CM

## 2023-08-26 DIAGNOSIS — N644 Mastodynia: Secondary | ICD-10-CM

## 2023-08-26 DIAGNOSIS — R300 Dysuria: Secondary | ICD-10-CM

## 2023-08-26 MED ORDER — SUMATRIPTAN SUCCINATE 100 MG PO TABS: ORAL_TABLET | ORAL | 3 refills | Status: DC

## 2023-09-18 ENCOUNTER — Other Ambulatory Visit: Payer: Self-pay | Admitting: Physician Assistant

## 2023-09-26 ENCOUNTER — Other Ambulatory Visit: Payer: Self-pay | Admitting: Physician Assistant

## 2023-09-26 DIAGNOSIS — Z0001 Encounter for general adult medical examination with abnormal findings: Secondary | ICD-10-CM

## 2023-09-26 DIAGNOSIS — D509 Iron deficiency anemia, unspecified: Secondary | ICD-10-CM

## 2023-09-26 DIAGNOSIS — G43909 Migraine, unspecified, not intractable, without status migrainosus: Secondary | ICD-10-CM

## 2023-09-26 DIAGNOSIS — K219 Gastro-esophageal reflux disease without esophagitis: Secondary | ICD-10-CM

## 2023-09-26 DIAGNOSIS — N644 Mastodynia: Secondary | ICD-10-CM

## 2023-09-26 DIAGNOSIS — L601 Onycholysis: Secondary | ICD-10-CM

## 2023-09-26 DIAGNOSIS — E7800 Pure hypercholesterolemia, unspecified: Secondary | ICD-10-CM

## 2023-09-26 DIAGNOSIS — Z9882 Breast implant status: Secondary | ICD-10-CM

## 2023-09-26 DIAGNOSIS — G894 Chronic pain syndrome: Secondary | ICD-10-CM

## 2023-09-26 DIAGNOSIS — R300 Dysuria: Secondary | ICD-10-CM

## 2023-09-26 DIAGNOSIS — Q8500 Neurofibromatosis, unspecified: Secondary | ICD-10-CM

## 2023-10-05 ENCOUNTER — Encounter: Payer: Self-pay | Admitting: Oncology

## 2023-10-05 ENCOUNTER — Other Ambulatory Visit: Payer: Self-pay | Admitting: Physician Assistant

## 2023-10-05 DIAGNOSIS — R300 Dysuria: Secondary | ICD-10-CM

## 2023-10-22 ENCOUNTER — Ambulatory Visit: Payer: 59 | Admitting: Physician Assistant

## 2023-11-05 ENCOUNTER — Encounter: Payer: Self-pay | Admitting: Oncology

## 2023-11-05 ENCOUNTER — Ambulatory Visit (INDEPENDENT_AMBULATORY_CARE_PROVIDER_SITE_OTHER): Payer: 59 | Admitting: Physician Assistant

## 2023-11-05 ENCOUNTER — Encounter: Payer: Self-pay | Admitting: Physician Assistant

## 2023-11-05 VITALS — BP 108/62 | HR 65 | Temp 98.0°F | Resp 16 | Ht 64.0 in | Wt 153.6 lb

## 2023-11-05 DIAGNOSIS — N390 Urinary tract infection, site not specified: Secondary | ICD-10-CM | POA: Diagnosis not present

## 2023-11-05 DIAGNOSIS — R319 Hematuria, unspecified: Secondary | ICD-10-CM

## 2023-11-05 DIAGNOSIS — G8929 Other chronic pain: Secondary | ICD-10-CM

## 2023-11-05 DIAGNOSIS — R300 Dysuria: Secondary | ICD-10-CM

## 2023-11-05 DIAGNOSIS — R519 Headache, unspecified: Secondary | ICD-10-CM

## 2023-11-05 DIAGNOSIS — Q8500 Neurofibromatosis, unspecified: Secondary | ICD-10-CM

## 2023-11-05 LAB — POCT URINALYSIS DIPSTICK
Bilirubin, UA: NEGATIVE
Blood, UA: POSITIVE
Glucose, UA: NEGATIVE
Ketones, UA: NEGATIVE
Nitrite, UA: NEGATIVE
Protein, UA: NEGATIVE
Spec Grav, UA: 1.01 (ref 1.010–1.025)
Urobilinogen, UA: 0.2 U/dL
pH, UA: 5 (ref 5.0–8.0)

## 2023-11-05 MED ORDER — CIPROFLOXACIN HCL 500 MG PO TABS: 500.0000 mg | ORAL_TABLET | Freq: Two times a day (BID) | ORAL | 0 refills | Status: AC

## 2023-11-05 NOTE — Progress Notes (Signed)
[facility] [address] [place], [place] [address]  Internal MEDICINE  Office Visit Note  Patient Name: Andrea Bernard  [id number]  [id number]  Date of Service: 11/05/2023  Chief Complaint  Patient presents with   Follow-up   Gastroesophageal Reflux   Urinary Tract Infection   Mass    Mass on kidney, needs imaging done     HPI Pt is here for a sick visit. -UTI symptoms since last week, having burning, urgency, frequency, and flank pain. -needs to get back in with urology, has chronic UTIs and also renal cyst -open to retrying cipro as previously did the best with this. Thinks last time just was having separate nausea that she is prone to and it got added as an allergy. States it is just macrobid she really can't do.  -did not realize CT already done for renal cyst last march with urology. Thought it had been cancelled, which it was but then it was rescheduled and done 01/01/23. She will follow up with urology -having more headaches again. She has these chronically, but a little more recently. Has not seen neurology in a long time for her neurofibromatosis and is wondering if something has been progressing. Would like new referral  Current Medication:  Outpatient Encounter Medications as of 11/05/2023  Medication Sig   albuterol (VENTOLIN HFA) 108 (90 Base) MCG/ACT inhaler INHALE 2 PUFFS BY MOUTH EVERY 6 HOURS AS NEEDED FOR WHEEZE OR SHORTNESS OF BREATH   ciprofloxacin (CIPRO) 500 MG tablet Take 1 tablet (500 mg total) by mouth 2 (two) times daily for 7 days.   gabapentin (NEURONTIN) 300 MG capsule Take 300 mg by mouth 3 (three) times daily.   ibuprofen (ADVIL) 200 MG tablet Take 200 mg by mouth as needed.   metFORMIN (GLUCOPHAGE-XR) 500 MG 24 hr tablet TAKE 1 TABLET BY MOUTH DAILY WITH SUPPER.   OXYCONTIN 80 MG 12 hr tablet Take 80 mg by mouth every 12 (twelve) hours.   pantoprazole (PROTONIX) 40 MG tablet TAKE 1 TABLET BY MOUTH TWICE A DAY   Semaglutide,0.25  or 0.5MG /DOS, (OZEMPIC, 0.25 OR 0.5 MG/DOSE,) 2 MG/3ML SOPN INJECT 0.25MG  INTO THE SKIN ONCE A WEEK FOR 4 WEEKS THEN INCREASE TO 0.5MG  WEEKLY.   SUMAtriptan (IMITREX) 100 MG tablet May repeat in 2 hours if headache persists or recurs.   Facility-Administered Encounter Medications as of 11/05/2023  Medication   iron sucrose (VENOFER) 200 mg IVPB   sodium chloride flush (NS) 0.9 % injection 10 mL      Medical History: Past Medical History:  Diagnosis Date   Arthritis    Dysplastic nevus 03/02/2008   Right lateral thigh. Moderate atypia, limited margins free.   Dysplastic nevus 01/27/2023   Right posterior forearm. Moderate atypia, close to margin.   GERD (gastroesophageal reflux disease)    Headache    Iron deficiency anemia 06/15/2017   Neurofibromatosis (HCC)      Vital Signs: BP 108/62   Pulse 65   Temp 98 F (36.7 C)   Resp 16   Ht 5\' 4"  (1.626 m)   Wt 153 lb 9.6 oz (69.7 kg)   LMP 10/06/2018   SpO2 97%   BMI 26.37 kg/m    Review of Systems  Constitutional:  Negative for fatigue and fever.  HENT:  Negative for congestion, mouth sores and postnasal drip.   Respiratory:  Negative for cough.   Cardiovascular:  Negative for chest pain.  Genitourinary:  Positive for dysuria, flank pain, frequency and  urgency.  Neurological:  Positive for headaches.  Psychiatric/Behavioral: Negative.      Physical Exam Vitals and nursing note reviewed.  Constitutional:      General: She is not in acute distress.    Appearance: Normal appearance. She is well-developed. She is not diaphoretic.  HENT:     Head: Normocephalic and atraumatic.  Eyes:     Pupils: Pupils are equal, round, and reactive to light.  Neck:     Thyroid: No thyromegaly.     Vascular: No JVD.     Trachea: No tracheal deviation.  Cardiovascular:     Rate and Rhythm: Normal rate and regular rhythm.     Heart sounds: Normal heart sounds. No murmur heard.    No friction rub. No gallop.  Pulmonary:      Effort: Pulmonary effort is normal. No respiratory distress.     Breath sounds: No wheezing or rales.  Chest:     Chest wall: No tenderness.  Breasts:    Right: Normal. No mass.     Left: No mass.  Musculoskeletal:        General: Normal range of motion.     Cervical back: Normal range of motion and neck supple.  Lymphadenopathy:     Cervical: No cervical adenopathy.  Skin:    General: Skin is warm and dry.  Neurological:     Mental Status: She is alert and oriented to person, place, and time.  Psychiatric:        Behavior: Behavior normal.        Thought Content: Thought content normal.        Judgment: Judgment normal.       Assessment/Plan: 1. Urinary tract infection with hematuria, site unspecified Will treat with cipro per pt request despite previously listing S/E of nausea. Should stop and notify office if S/E occurs. Will adjust based on C/S - CULTURE, URINE COMPREHENSIVE - ciprofloxacin (CIPRO) 500 MG tablet; Take 1 tablet (500 mg total) by mouth 2 (two) times daily for 7 days.  Dispense: 14 tablet; Refill: 0  2. Dysuria (Primary) - POCT Urinalysis Dipstick  3. Neurofibromatosis (HCC) Needs to reestablish with neurology - Ambulatory referral to Neurology  4. Chronic nonintractable headache, unspecified headache type - Ambulatory referral to Neurology   General Counseling: camiya vinal understanding of the findings of todays visit and agrees with plan of treatment. I have discussed any further diagnostic evaluation that may be needed or ordered today. We also reviewed her medications today. she has been encouraged to call the office with any questions or concerns that should arise related to todays visit.    Counseling:    Orders Placed This Encounter  Procedures   CULTURE, URINE COMPREHENSIVE   Ambulatory referral to Neurology   POCT Urinalysis Dipstick    Meds ordered this encounter  Medications   ciprofloxacin (CIPRO) 500 MG tablet    Sig:  Take 1 tablet (500 mg total) by mouth 2 (two) times daily for 7 days.    Dispense:  14 tablet    Refill:  0    Time spent:30 Minutes

## 2023-11-09 ENCOUNTER — Telehealth: Payer: Self-pay

## 2023-11-09 ENCOUNTER — Other Ambulatory Visit: Payer: Self-pay

## 2023-11-09 LAB — CULTURE, URINE COMPREHENSIVE

## 2023-11-09 MED ORDER — AMOXICILLIN-POT CLAVULANATE 875-125 MG PO TABS: 1.0000 | ORAL_TABLET | Freq: Two times a day (BID) | ORAL | 0 refills | Status: DC

## 2023-11-09 MED ORDER — FLUCONAZOLE 150 MG PO TABS
ORAL_TABLET | ORAL | 0 refills | Status: DC
Start: 2023-11-09 — End: 2024-02-22

## 2023-11-09 MED ORDER — FLUCONAZOLE 150 MG PO TABS: 150.0000 mg | ORAL_TABLET | Freq: Once | ORAL | 0 refills | Status: AC

## 2023-11-09 NOTE — Telephone Encounter (Signed)
-----   Message from Carlean Jews sent at 11/09/2023 12:51 PM EST ----- Please let her know that culture shows bacteria resistant to cipro and need to change this. Can switch to augmentin BID for 5 days

## 2023-11-09 NOTE — Telephone Encounter (Signed)
LVM for patient regarding ABX change.

## 2023-11-12 ENCOUNTER — Encounter: Payer: Self-pay | Admitting: Oncology

## 2023-11-24 ENCOUNTER — Ambulatory Visit: Payer: BC Managed Care – PPO | Admitting: Dermatology

## 2023-11-30 ENCOUNTER — Other Ambulatory Visit: Payer: Self-pay | Admitting: Physician Assistant

## 2023-11-30 DIAGNOSIS — G43909 Migraine, unspecified, not intractable, without status migrainosus: Secondary | ICD-10-CM

## 2023-11-30 DIAGNOSIS — R300 Dysuria: Secondary | ICD-10-CM

## 2023-11-30 DIAGNOSIS — D509 Iron deficiency anemia, unspecified: Secondary | ICD-10-CM

## 2023-11-30 DIAGNOSIS — Z0001 Encounter for general adult medical examination with abnormal findings: Secondary | ICD-10-CM

## 2023-11-30 DIAGNOSIS — Z9882 Breast implant status: Secondary | ICD-10-CM

## 2023-11-30 DIAGNOSIS — L601 Onycholysis: Secondary | ICD-10-CM

## 2023-11-30 DIAGNOSIS — Q8500 Neurofibromatosis, unspecified: Secondary | ICD-10-CM

## 2023-11-30 DIAGNOSIS — N644 Mastodynia: Secondary | ICD-10-CM

## 2023-11-30 DIAGNOSIS — E7800 Pure hypercholesterolemia, unspecified: Secondary | ICD-10-CM

## 2023-11-30 DIAGNOSIS — K219 Gastro-esophageal reflux disease without esophagitis: Secondary | ICD-10-CM

## 2023-11-30 DIAGNOSIS — G894 Chronic pain syndrome: Secondary | ICD-10-CM

## 2024-02-01 ENCOUNTER — Ambulatory Visit: Payer: Self-pay | Admitting: Dermatology

## 2024-02-11 ENCOUNTER — Other Ambulatory Visit: Payer: Self-pay | Admitting: Physician Assistant

## 2024-02-18 ENCOUNTER — Encounter: Payer: 59 | Admitting: Physician Assistant

## 2024-02-22 ENCOUNTER — Encounter: Payer: Self-pay | Admitting: Diagnostic Neuroimaging

## 2024-02-22 ENCOUNTER — Ambulatory Visit: Payer: Self-pay | Admitting: Diagnostic Neuroimaging

## 2024-02-22 VITALS — BP 96/70 | HR 64 | Ht 64.0 in | Wt 143.0 lb

## 2024-02-22 DIAGNOSIS — Q8500 Neurofibromatosis, unspecified: Secondary | ICD-10-CM | POA: Diagnosis not present

## 2024-02-22 DIAGNOSIS — Q8501 Neurofibromatosis, type 1: Secondary | ICD-10-CM

## 2024-02-22 DIAGNOSIS — G43701 Chronic migraine without aura, not intractable, with status migrainosus: Secondary | ICD-10-CM | POA: Diagnosis not present

## 2024-02-22 MED ORDER — TOPIRAMATE 50 MG PO TABS: 50.0000 mg | ORAL_TABLET | Freq: Two times a day (BID) | ORAL | 12 refills | Status: AC

## 2024-02-22 NOTE — Patient Instructions (Signed)
 MIGRAINE WITHOUT AURA - topiramate  50mg  twice a day; drink plenty of water - sumatriptan  100mg  as needed  NF1 - check MRI brain

## 2024-02-22 NOTE — Progress Notes (Signed)
 [facility]  PATIENT: Andrea Bernard DOB: 1965/11/30  REFERRING CLINICIAN: McDonough, Lillie Reining, PA* HISTORY FROM: patient  REASON FOR VISIT: new consult   HISTORICAL  CHIEF COMPLAINT:  Chief Complaint  Patient presents with   Headache    Rm 7 alone Pt is well, reports she has been having headaches since 1976. She has 5-6 headache days a month that last several days. Associated nausea, sensitivity to light and sound, brain fog.  She also has chronic pain due to masses.     HISTORY OF PRESENT ILLNESS:   58 year old female here for evaluation of headaches and neurofibromatosis type I.  Patient reports history of left eye vision problems and proptosis around 4th or 5th grade, this diagnosed with optic nerve glioma.  By age 38 she was diagnosed with neurofibromatosis type I.  Family history of neurofibromatosis and her father, paternal grandmother and great grandfather.  Has had other cutaneous fibromas and a plexiform neurofibroma as well.  Has had some of these resected.  Also with history of headaches with migraine features for many years.  Now having 20 days of migraine headaches per month.  She describes global throbbing headaches associate with nausea and sensitive to light.  No visual aura.  Has tried amitriptyline (not working) and sumatriptan  (helps some).  Now having some fullness in the right ear with tingling sensations.  Also with some chronic pain issues throughout the body on pain management.  Last MRI of the brain was in 2019.  Last neurology evaluation was in 2019 and 2020.     REVIEW OF SYSTEMS: Full 14 system review of systems performed and negative with exception of: as per HPI.  ALLERGIES: Allergies  Allergen Reactions   Ciprofloxacin  Nausea And Vomiting   Nitrofuran Derivatives Nausea And Vomiting    HOME MEDICATIONS: Outpatient Medications Prior to Visit  Medication Sig Dispense Refill   albuterol  (VENTOLIN  HFA) 108 (90  Base) MCG/ACT inhaler INHALE 2 PUFFS BY MOUTH EVERY 6 HOURS AS NEEDED FOR WHEEZE OR SHORTNESS OF BREATH 6.7 each 1   gabapentin  (NEURONTIN ) 300 MG capsule Take 300 mg by mouth 3 (three) times daily.  0   ibuprofen (ADVIL) 200 MG tablet Take 200 mg by mouth as needed.     OXYCONTIN  80 MG 12 hr tablet Take 80 mg by mouth every 12 (twelve) hours.  0   SUMAtriptan  (IMITREX ) 100 MG tablet MAY REPEAT IN 2 HOURS IF HEADACHE PERSISTS OR RECURS. 9 tablet 3   amoxicillin -clavulanate (AUGMENTIN ) 875-125 MG tablet Take 1 tablet by mouth 2 (two) times daily. (Patient not taking: Reported on 02/22/2024) 10 tablet 0   fluconazole  (DIFLUCAN ) 150 MG tablet Take 1 tablet by mouth. If symptoms persister after 3 days, take another. Repeat as needed. (Patient not taking: Reported on 02/22/2024) 3 tablet 0   metFORMIN  (GLUCOPHAGE -XR) 500 MG 24 hr tablet TAKE 1 TABLET BY MOUTH DAILY WITH SUPPER. (Patient not taking: Reported on 02/22/2024) 90 tablet 3   pantoprazole  (PROTONIX ) 40 MG tablet TAKE 1 TABLET BY MOUTH TWICE A DAY (Patient not taking: Reported on 02/22/2024) 180 tablet 1   Semaglutide ,0.25 or 0.5MG /DOS, (OZEMPIC , 0.25 OR 0.5 MG/DOSE,) 2 MG/3ML SOPN INJECT 0.25MG  INTO THE SKIN ONCE A WEEK FOR 4 WEEKS THEN INCREASE TO 0.5MG  WEEKLY. (Patient not taking: Reported on 02/22/2024) 3 mL 3   Facility-Administered Medications Prior to Visit  Medication Dose Route Frequency Provider Last Rate Last Admin   iron  sucrose (VENOFER ) 200 mg IVPB  200 mg Intravenous  Weekly Yu, Zhou, MD       sodium chloride  flush (NS) 0.9 % injection 10 mL  10 mL Intracatheter PRN Timmy Forbes, MD        PAST MEDICAL HISTORY: Past Medical History:  Diagnosis Date   Arthritis    Dysplastic nevus 03/02/2008   Right lateral thigh. Moderate atypia, limited margins free.   Dysplastic nevus 01/27/2023   Right posterior forearm. Moderate atypia, close to margin.   GERD (gastroesophageal reflux disease)    Headache    Iron  deficiency anemia 06/15/2017    Neurofibromatosis (HCC)     PAST SURGICAL HISTORY: Past Surgical History:  Procedure Laterality Date   AUGMENTATION MAMMAPLASTY Bilateral 12/10/2020   BREAST ENHANCEMENT SURGERY Bilateral 12/04/2020   carpel tunnel release Bilateral    CESAREAN SECTION     x 4   COLONOSCOPY WITH PROPOFOL  N/A 09/04/2017   Procedure: COLONOSCOPY WITH PROPOFOL ;  Surgeon: Luke Salaam, MD;  Location[facility] ENDOSCOPY;  Service: Gastroenterology;  Laterality: N/A;   COLONOSCOPY WITH PROPOFOL  N/A 12/21/2017   Procedure: COLONOSCOPY WITH PROPOFOL ;  Surgeon: Luke Salaam, MD;  Location: [facility] ENDOSCOPY;  Service: Gastroenterology;  Laterality: N/A;   ESOPHAGOGASTRODUODENOSCOPY (EGD) WITH PROPOFOL  N/A 09/04/2017   Procedure: ESOPHAGOGASTRODUODENOSCOPY (EGD) WITH PROPOFOL ;  Surgeon: Luke Salaam, MD;  Location: [facility] ENDOSCOPY;  Service: Gastroenterology;  Laterality: N/A;   HERNIA REPAIR     HERNIA REPAIR  12/04/2020   TUBAL LIGATION     tummy tuck  12/04/2020   removed 20 masses on stomach   TUMOR REMOVAL     x8    FAMILY HISTORY: Family History  Problem Relation Age of Onset   Anemia Mother    Lupus Mother    Sjogren's syndrome Mother    Rheum arthritis Mother    Schizophrenia Father    Neurofibromatosis Brother    Neurofibromatosis Son    Neurofibromatosis Son    Neurofibromatosis Daughter    Bladder Cancer Neg Hx    Kidney cancer Neg Hx    Breast cancer Neg Hx    Ovarian cancer Neg Hx    Colon cancer Neg Hx     SOCIAL HISTORY: Social History   Socioeconomic History   Marital status: Married    Spouse name: Not on file   Number of children: Not on file   Years of education: Not on file   Highest education level: Not on file  Occupational History   Not on file  Tobacco Use   Smoking status: Former    Types: Cigarettes   Smokeless tobacco: Never   Tobacco comments:    Age [age]  Vaping Use   Vaping status: Never Used  Substance and Sexual Activity   Alcohol  use: No   Drug use:  Not Currently   Sexual activity: Yes    Birth control/protection: Surgical  Other Topics Concern   Not on file  Social History Narrative   Not on file   Social Drivers of Health   Financial Resource Strain: Not on file  Food Insecurity: Not on file  Transportation Needs: Not on file  Physical Activity: Not on file  Stress: Not on file  Social Connections: Not on file  Intimate Partner Violence: Not on file     PHYSICAL EXAM  GENERAL EXAM/CONSTITUTIONAL: Vitals:  Vitals:   02/22/24 0857  BP: 96/70  Pulse: 64  Weight: 143 lb (64.9 kg)  Height: 5\' 4"  (1.626 m)   Body mass index is 24.55 kg/m. Wt Readings from Last 3 Encounters:  02/22/24 143 lb (64.9 kg)  11/05/23 153 lb 9.6 oz (69.7 kg)  07/29/23 145 lb 4.8 oz (65.9 kg)   Patient is in no distress; well developed, nourished and groomed; neck is supple  CARDIOVASCULAR: Examination of carotid arteries is normal; no carotid bruits Regular rate and rhythm, no murmurs Examination of peripheral vascular system by observation and palpation is normal  EYES: Ophthalmoscopic exam of optic discs and posterior segments is normal; no papilledema or hemorrhages No results found.  MUSCULOSKELETAL: Gait, strength, tone, movements noted in Neurologic exam below  NEUROLOGIC: MENTAL STATUS:      No data to display         awake, alert, oriented to person, place and time recent and remote memory intact normal attention and concentration language fluent, comprehension intact, naming intact fund of knowledge appropriate  CRANIAL NERVE:  2nd - no papilledema on fundoscopic exam 2nd, 3rd, 4th, 6th - pupils PINPOINT, MINIMAL REACTION; visual fields full to confrontation, extraocular muscles intact, no nystagmus 5th - facial sensation symmetric 7th - facial strength symmetric 8th - hearing intact 9th - palate elevates symmetrically, uvula midline 11th - shoulder shrug symmetric 12th - tongue protrusion midline  MOTOR:   normal bulk and tone, full strength in the BUE, BLE  SENSORY:  normal and symmetric to light touch, temperature, vibration  COORDINATION:  finger-nose-finger, fine finger movements normal  REFLEXES:  deep tendon reflexes present and symmetric  GAIT/STATION:  narrow based gait     DIAGNOSTIC DATA (LABS, IMAGING, TESTING) - I reviewed patient records, labs, notes, testing and imaging myself where available.  Lab Results  Component Value Date   WBC 5.9 07/29/2023   HGB 13.5 07/29/2023   HCT 41.6 07/29/2023   MCV 87.9 07/29/2023   PLT 215 07/29/2023      Component Value Date/Time   NA 139 02/10/2023 1016   K 4.8 02/10/2023 1016   CL 102 02/10/2023 1016   CO2 25 02/10/2023 1016   GLUCOSE 82 02/10/2023 1016   GLUCOSE 112 (H) 02/12/2021 2315   BUN 15 02/10/2023 1016   CREATININE 0.83 02/10/2023 1016   CALCIUM 9.2 02/10/2023 1016   PROT 6.5 02/10/2023 1016   ALBUMIN 4.4 02/10/2023 1016   AST 20 02/10/2023 1016   ALT 12 02/10/2023 1016   ALKPHOS 48 02/10/2023 1016   BILITOT 0.3 02/10/2023 1016   GFRNONAA >60 02/12/2021 2315   GFRAA 86 07/29/2019 1111   Lab Results  Component Value Date   CHOL 204 (H) 02/10/2023   HDL 60 02/10/2023   LDLCALC 131 (H) 02/10/2023   TRIG 74 02/10/2023   CHOLHDL 3.0 07/29/2019   Lab Results  Component Value Date   HGBA1C 5.3 06/19/2023   Lab Results  Component Value Date   VITAMINB12 >2000 (H) 02/10/2023   Lab Results  Component Value Date   TSH 0.585 06/19/2023    03/13/18 MRI brain  1. No acute intracranial abnormality and normal MRI appearance of the brain. 2. Chronic bilateral optic nerve enlargement, greater on the left, is stable since 2006. These are compatible with chronic optic nerve glioma is in the setting of type 1 Neurofibromatosis. 3. Mild paranasal sinus mucosal thickening. Multiple small mucous retention cysts in the left maxillary sinus. 4. Trace fluid in the left petrous apex is likely  postinflammatory and inconsequential.    ASSESSMENT AND PLAN  58 y.o. year old female here with:  Meds tried: amitriptyline, sumatriptan   Dx:  1. Neurofibromatosis (HCC)   2. Type 1  neurofibromatosis (HCC)   3. Chronic migraine without aura with status migrainosus, not intractable     PLAN:  MIGRAINE WITH  - topiramate  50mg  twice a day - sumatriptan  100mg  as needed  NF1 - check MRI brain to monitor   Orders Placed This Encounter  Procedures   MR BRAIN/IAC W WO CONTRAST   Meds ordered this encounter  Medications   topiramate  (TOPAMAX ) 50 MG tablet    Sig: Take 1 tablet (50 mg total) by mouth 2 (two) times daily.    Dispense:  60 tablet    Refill:  12   Return in about 4 months (around 06/24/2024) for MyChart visit (15 min).    Omega Bible, MD 02/22/2024, 10:33 AM Certified in Neurology, Neurophysiology and Neuroimaging  [facility] [address] [place], [place] [address] [phone]

## 2024-03-01 ENCOUNTER — Other Ambulatory Visit

## 2024-03-02 ENCOUNTER — Other Ambulatory Visit: Payer: Self-pay | Admitting: Dermatology

## 2024-03-02 DIAGNOSIS — E882 Lipomatosis, not elsewhere classified: Secondary | ICD-10-CM

## 2024-03-02 DIAGNOSIS — R7309 Other abnormal glucose: Secondary | ICD-10-CM

## 2024-03-08 ENCOUNTER — Ambulatory Visit

## 2024-03-08 DIAGNOSIS — Q8500 Neurofibromatosis, unspecified: Secondary | ICD-10-CM

## 2024-03-08 MED ORDER — GADOBENATE DIMEGLUMINE 529 MG/ML IV SOLN
13.0000 mL | Freq: Once | INTRAVENOUS | Status: AC | PRN
  Administered 2024-03-08: 13 mL via INTRAVENOUS

## 2024-03-09 ENCOUNTER — Ambulatory Visit: Payer: Self-pay | Admitting: Diagnostic Neuroimaging

## 2024-03-09 ENCOUNTER — Other Ambulatory Visit: Payer: Self-pay | Admitting: Physician Assistant

## 2024-03-09 DIAGNOSIS — R300 Dysuria: Secondary | ICD-10-CM

## 2024-03-09 DIAGNOSIS — G43909 Migraine, unspecified, not intractable, without status migrainosus: Secondary | ICD-10-CM

## 2024-03-09 DIAGNOSIS — G894 Chronic pain syndrome: Secondary | ICD-10-CM

## 2024-03-09 DIAGNOSIS — Z0001 Encounter for general adult medical examination with abnormal findings: Secondary | ICD-10-CM

## 2024-03-09 DIAGNOSIS — D509 Iron deficiency anemia, unspecified: Secondary | ICD-10-CM

## 2024-03-09 DIAGNOSIS — E7800 Pure hypercholesterolemia, unspecified: Secondary | ICD-10-CM

## 2024-03-09 DIAGNOSIS — Z9882 Breast implant status: Secondary | ICD-10-CM

## 2024-03-09 DIAGNOSIS — K219 Gastro-esophageal reflux disease without esophagitis: Secondary | ICD-10-CM

## 2024-03-09 DIAGNOSIS — L601 Onycholysis: Secondary | ICD-10-CM

## 2024-03-09 DIAGNOSIS — Q8500 Neurofibromatosis, unspecified: Secondary | ICD-10-CM

## 2024-03-09 DIAGNOSIS — N644 Mastodynia: Secondary | ICD-10-CM

## 2024-03-20 ENCOUNTER — Other Ambulatory Visit: Payer: Self-pay | Admitting: Physician Assistant

## 2024-03-20 DIAGNOSIS — Q8500 Neurofibromatosis, unspecified: Secondary | ICD-10-CM

## 2024-03-20 DIAGNOSIS — Z9882 Breast implant status: Secondary | ICD-10-CM

## 2024-03-20 DIAGNOSIS — Z0001 Encounter for general adult medical examination with abnormal findings: Secondary | ICD-10-CM

## 2024-03-20 DIAGNOSIS — R300 Dysuria: Secondary | ICD-10-CM

## 2024-03-20 DIAGNOSIS — N644 Mastodynia: Secondary | ICD-10-CM

## 2024-03-20 DIAGNOSIS — E7800 Pure hypercholesterolemia, unspecified: Secondary | ICD-10-CM

## 2024-03-20 DIAGNOSIS — L601 Onycholysis: Secondary | ICD-10-CM

## 2024-03-20 DIAGNOSIS — G894 Chronic pain syndrome: Secondary | ICD-10-CM

## 2024-03-20 DIAGNOSIS — K219 Gastro-esophageal reflux disease without esophagitis: Secondary | ICD-10-CM

## 2024-03-20 DIAGNOSIS — G43909 Migraine, unspecified, not intractable, without status migrainosus: Secondary | ICD-10-CM

## 2024-03-20 DIAGNOSIS — D509 Iron deficiency anemia, unspecified: Secondary | ICD-10-CM

## 2024-03-21 NOTE — Telephone Encounter (Signed)
 Please review

## 2024-04-01 ENCOUNTER — Other Ambulatory Visit: Payer: Self-pay | Admitting: Physician Assistant

## 2024-04-07 ENCOUNTER — Ambulatory Visit (INDEPENDENT_AMBULATORY_CARE_PROVIDER_SITE_OTHER): Admitting: Physician Assistant

## 2024-04-07 ENCOUNTER — Encounter: Payer: Self-pay | Admitting: Physician Assistant

## 2024-04-07 VITALS — BP 115/70 | HR 69 | Temp 98.0°F | Resp 16 | Ht 64.0 in | Wt 138.2 lb

## 2024-04-07 DIAGNOSIS — Z124 Encounter for screening for malignant neoplasm of cervix: Secondary | ICD-10-CM | POA: Diagnosis not present

## 2024-04-07 DIAGNOSIS — N390 Urinary tract infection, site not specified: Secondary | ICD-10-CM

## 2024-04-07 DIAGNOSIS — Z0001 Encounter for general adult medical examination with abnormal findings: Secondary | ICD-10-CM | POA: Diagnosis not present

## 2024-04-07 DIAGNOSIS — R300 Dysuria: Secondary | ICD-10-CM

## 2024-04-07 DIAGNOSIS — Q8500 Neurofibromatosis, unspecified: Secondary | ICD-10-CM | POA: Diagnosis not present

## 2024-04-07 DIAGNOSIS — N644 Mastodynia: Secondary | ICD-10-CM

## 2024-04-07 DIAGNOSIS — R319 Hematuria, unspecified: Secondary | ICD-10-CM

## 2024-04-07 LAB — POCT URINALYSIS DIPSTICK
Bilirubin, UA: NEGATIVE
Glucose, UA: NEGATIVE
Ketones, UA: POSITIVE
Nitrite, UA: NEGATIVE
Protein, UA: NEGATIVE
Spec Grav, UA: 1.01 (ref 1.010–1.025)
Urobilinogen, UA: 0.2 U/dL
pH, UA: 5 (ref 5.0–8.0)

## 2024-04-07 MED ORDER — CIPROFLOXACIN HCL 500 MG PO TABS: 500.0000 mg | ORAL_TABLET | Freq: Two times a day (BID) | ORAL | 0 refills | Status: AC

## 2024-04-07 NOTE — Progress Notes (Signed)
 [facility] [address] [place], [place] [address]  Internal MEDICINE  Office Visit Note  Patient Name: Andrea Bernard  [id number]  [id number]  Date of Service: 04/07/2024  Chief Complaint  Patient presents with   Annual Exam   Gastroesophageal Reflux   Quality Metric Gaps    Mammogram    Urinary Tract Infection     HPI Pt is here for routine health maintenance examination -would like to see urogynecology due to recurrent UTIs and needs pap as well and prefers to have this done with GYN -has UTI symptoms now. Normally does best with cipro  despite GI upset on it once before. Would like this again while awaiting culture -headache today, followed by neurology -right breast tenderness along medial edge, possible mass, does have implants which make exam more difficult  -due for labs-ordered  Current Medication: Outpatient Encounter Medications as of 04/07/2024  Medication Sig   albuterol  (VENTOLIN  HFA) 108 (90 Base) MCG/ACT inhaler INHALE 2 PUFFS BY MOUTH EVERY 6 HOURS AS NEEDED FOR WHEEZE OR SHORTNESS OF BREATH   ciprofloxacin  (CIPRO ) 500 MG tablet Take 1 tablet (500 mg total) by mouth 2 (two) times daily for 7 days.   gabapentin  (NEURONTIN ) 300 MG capsule Take 300 mg by mouth 3 (three) times daily.   ibuprofen (ADVIL) 200 MG tablet Take 200 mg by mouth as needed.   OXYCONTIN  80 MG 12 hr tablet Take 80 mg by mouth every 12 (twelve) hours.   SUMAtriptan  (IMITREX ) 100 MG tablet MAY REPEAT IN 2 HOURS IF HEADACHE PERSISTS OR RECURS.   topiramate  (TOPAMAX ) 50 MG tablet Take 1 tablet (50 mg total) by mouth 2 (two) times daily.   Facility-Administered Encounter Medications as of 04/07/2024  Medication   iron  sucrose (VENOFER ) 200 mg IVPB   sodium chloride  flush (NS) 0.9 % injection 10 mL    Surgical History: Past Surgical History:  Procedure Laterality Date   AUGMENTATION MAMMAPLASTY Bilateral 12/10/2020   BREAST ENHANCEMENT SURGERY Bilateral 12/04/2020   carpel  tunnel release Bilateral    CESAREAN SECTION     x 4   COLONOSCOPY WITH PROPOFOL  N/A 09/04/2017   Procedure: COLONOSCOPY WITH PROPOFOL ;  Surgeon: Therisa Bi, MD;  Location: [facility] ENDOSCOPY;  Service: Gastroenterology;  Laterality: N/A;   COLONOSCOPY WITH PROPOFOL  N/A 12/21/2017   Procedure: COLONOSCOPY WITH PROPOFOL ;  Surgeon: Therisa Bi, MD;  Location: [facility] ENDOSCOPY;  Service: Gastroenterology;  Laterality: N/A;   ESOPHAGOGASTRODUODENOSCOPY (EGD) WITH PROPOFOL  N/A 09/04/2017   Procedure: ESOPHAGOGASTRODUODENOSCOPY (EGD) WITH PROPOFOL ;  Surgeon: Therisa Bi, MD;  Location: [facility] ENDOSCOPY;  Service: Gastroenterology;  Laterality: N/A;   HERNIA REPAIR     HERNIA REPAIR  12/04/2020   TUBAL LIGATION     tummy tuck  12/04/2020   removed 20 masses on stomach   TUMOR REMOVAL     x8    Medical History: Past Medical History:  Diagnosis Date   Arthritis    Dysplastic nevus 03/02/2008   Right lateral thigh. Moderate atypia, limited margins free.   Dysplastic nevus 01/27/2023   Right posterior forearm. Moderate atypia, close to margin.   GERD (gastroesophageal reflux disease)    Headache    Iron  deficiency anemia 06/15/2017   Neurofibromatosis (HCC)     Family History: Family History  Problem Relation Age of Onset   Anemia Mother    Lupus Mother    Sjogren's syndrome Mother    Rheum arthritis Mother    Schizophrenia Father    Neurofibromatosis Brother  Neurofibromatosis Son    Neurofibromatosis Son    Neurofibromatosis Daughter    Bladder Cancer Neg Hx    Kidney cancer Neg Hx    Breast cancer Neg Hx    Ovarian cancer Neg Hx    Colon cancer Neg Hx       Review of Systems  Constitutional:  Negative for fatigue and fever.  HENT:  Negative for congestion, mouth sores and postnasal drip.   Respiratory:  Negative for cough.   Cardiovascular:  Negative for chest pain.  Gastrointestinal:  Negative for abdominal pain.  Genitourinary:  Positive for dysuria, flank pain,  frequency and urgency.  Musculoskeletal:  Positive for arthralgias.  Skin:  Negative for rash.  Neurological:  Positive for headaches. Negative for weakness.  Psychiatric/Behavioral: Negative.  Negative for behavioral problems. The patient is not nervous/anxious.      Vital Signs: BP 115/70   Pulse 69   Temp 98 F (36.7 C)   Resp 16   Ht 5' 4 (1.626 m)   Wt 138 lb 3.2 oz (62.7 kg)   LMP 10/06/2018   SpO2 96%   BMI 23.72 kg/m    Physical Exam Vitals and nursing note reviewed.  Constitutional:      General: She is not in acute distress.    Appearance: Normal appearance. She is well-developed. She is not diaphoretic.  HENT:     Head: Normocephalic and atraumatic.     Mouth/Throat:     Pharynx: No oropharyngeal exudate.  Eyes:     Pupils: Pupils are equal, round, and reactive to light.  Neck:     Thyroid : No thyromegaly.     Vascular: No JVD.     Trachea: No tracheal deviation.  Cardiovascular:     Rate and Rhythm: Normal rate and regular rhythm.     Heart sounds: Normal heart sounds. No murmur heard.    No friction rub. No gallop.  Pulmonary:     Effort: Pulmonary effort is normal. No respiratory distress.     Breath sounds: No wheezing or rales.  Chest:     Chest wall: No tenderness.  Breasts:    Right: Mass and tenderness present.     Left: No tenderness.    Abdominal:     General: Bowel sounds are normal.     Palpations: Abdomen is soft.  Musculoskeletal:        General: Normal range of motion.     Cervical back: Normal range of motion and neck supple.  Lymphadenopathy:     Cervical: No cervical adenopathy.  Skin:    General: Skin is warm and dry.  Neurological:     Mental Status: She is alert and oriented to person, place, and time.  Psychiatric:        Behavior: Behavior normal.        Thought Content: Thought content normal.        Judgment: Judgment normal.      LABS: Recent Results (from the past 2160 hours)  POCT Urinalysis Dipstick      Status: Abnormal   Collection Time: 04/07/24 11:31 AM  Result Value Ref Range   Color, UA     Clarity, UA     Glucose, UA Negative Negative   Bilirubin, UA Negtaive    Ketones, UA Positive    Spec Grav, UA 1.010 1.010 - 1.025   Blood, UA Moderate    pH, UA 5.0 5.0 - 8.0   Protein, UA Negative Negative   Urobilinogen, UA  0.2 0.2 or 1.0 E.U./dL   Nitrite, UA Negative    Leukocytes, UA Moderate (2+) (A) Negative   Appearance     Odor          Assessment/Plan: 1. Encounter for general adult medical examination with abnormal findings (Primary) CPE performed, labs slip given, due for mammogram--abnormal exam/symptoms therefore diagnostic ordered  2. Urinary tract infection with hematuria, site unspecified Will treat with cipro  despite allergy list--GI upset but tolerable while awaiting culture results - CULTURE, URINE COMPREHENSIVE - ciprofloxacin  (CIPRO ) 500 MG tablet; Take 1 tablet (500 mg total) by mouth 2 (two) times daily for 7 days.  Dispense: 14 tablet; Refill: 0  3. Recurrent UTI Will refer to urogynecology - Ambulatory referral to Urogynecology  4. Encounter for Papanicolaou smear for cervical cancer screening Will refer to urogynecology - Ambulatory referral to Urogynecology  5. Breast tenderness in female - MM 3D DIAGNOSTIC MAMMOGRAM BILATERAL BREAST W/IMPLANT; Future - US  LIMITED ULTRASOUND INCLUDING AXILLA LEFT BREAST ; Future - US  LIMITED ULTRASOUND INCLUDING AXILLA RIGHT BREAST; Future  6. Neurofibromatosis (HCC) - MM 3D DIAGNOSTIC MAMMOGRAM BILATERAL BREAST W/IMPLANT; Future - US  LIMITED ULTRASOUND INCLUDING AXILLA LEFT BREAST ; Future - US  LIMITED ULTRASOUND INCLUDING AXILLA RIGHT BREAST; Future  7. Dysuria - POCT Urinalysis Dipstick   General Counseling: Payeton verbalizes understanding of the findings of todays visit and agrees with plan of treatment. I have discussed any further diagnostic evaluation that may be needed or ordered today. We also  reviewed her medications today. she has been encouraged to call the office with any questions or concerns that should arise related to todays visit.    Counseling:    Orders Placed This Encounter  Procedures   CULTURE, URINE COMPREHENSIVE   MM 3D DIAGNOSTIC MAMMOGRAM BILATERAL BREAST W/IMPLANT   US  LIMITED ULTRASOUND INCLUDING AXILLA LEFT BREAST    US  LIMITED ULTRASOUND INCLUDING AXILLA RIGHT BREAST   Ambulatory referral to Urogynecology   POCT Urinalysis Dipstick    Meds ordered this encounter  Medications   ciprofloxacin  (CIPRO ) 500 MG tablet    Sig: Take 1 tablet (500 mg total) by mouth 2 (two) times daily for 7 days.    Dispense:  14 tablet    Refill:  0    This patient was seen by Tinnie Pro, PA-C in collaboration with Dr. Sigrid Bathe as a part of collaborative care agreement.  Total time spent:35 Minutes  Time spent includes review of chart, medications, test results, and follow up plan with the patient.     Sigrid CHRISTELLA Bathe, MD  Internal Medicine

## 2024-04-11 ENCOUNTER — Telehealth: Payer: Self-pay

## 2024-04-13 ENCOUNTER — Ambulatory Visit: Payer: Self-pay | Admitting: Physician Assistant

## 2024-04-13 ENCOUNTER — Other Ambulatory Visit: Payer: Self-pay

## 2024-04-13 LAB — CULTURE, URINE COMPREHENSIVE

## 2024-04-13 MED ORDER — AMOXICILLIN 500 MG PO CAPS
500.0000 mg | ORAL_CAPSULE | Freq: Two times a day (BID) | ORAL | 0 refills | Status: AC
Start: 2024-04-13 — End: 2024-04-18

## 2024-04-13 NOTE — Telephone Encounter (Signed)
-----   Message from Tinnie MARLA Pro sent at 04/13/2024  9:44 AM EDT ----- Please let her know that her culture came back with mixed flora which typically doesn't require ABX ----- Message ----- From: Almer Bi, CMA Sent: 04/07/2024  11:31 AM EDT To: Tinnie MARLA Pro, PA-C

## 2024-04-13 NOTE — Telephone Encounter (Signed)
 done

## 2024-04-13 NOTE — Telephone Encounter (Signed)
 Spoke with patient regarding urine culture. D/C Cipro  and start Amoxicillin  500mg  BID, per Lauren.

## 2024-04-14 ENCOUNTER — Ambulatory Visit
Admission: RE | Admit: 2024-04-14 | Discharge: 2024-04-14 | Disposition: A | Discharge status: Home / Self Care | Payer: Self-pay | Source: Ambulatory Visit | Attending: Physician Assistant | Admitting: Physician Assistant

## 2024-04-14 ENCOUNTER — Encounter: Payer: Self-pay | Admitting: Oncology

## 2024-04-14 DIAGNOSIS — N644 Mastodynia: Secondary | ICD-10-CM

## 2024-04-14 DIAGNOSIS — Q8500 Neurofibromatosis, unspecified: Secondary | ICD-10-CM | POA: Diagnosis present

## 2024-05-16 ENCOUNTER — Telehealth: Payer: Self-pay | Admitting: Physician Assistant

## 2024-05-16 NOTE — Telephone Encounter (Signed)
 Received clearance for dental procedure from [facility]. Gave to lauren-[name]

## 2024-05-17 ENCOUNTER — Telehealth: Payer: Self-pay | Admitting: Physician Assistant

## 2024-05-17 NOTE — Telephone Encounter (Signed)
 Dental clearance signed. Faxed back to [employer]; [phone]. Scanned-[name]

## 2024-05-20 ENCOUNTER — Other Ambulatory Visit: Payer: Self-pay | Admitting: Physician Assistant

## 2024-05-21 LAB — COMPREHENSIVE METABOLIC PANEL WITH GFR
ALT: 10 IU/L (ref 0–32)
AST: 16 IU/L (ref 0–40)
Albumin: 4.7 g/dL (ref 3.8–4.9)
Alkaline Phosphatase: 59 IU/L (ref 44–121)
BUN/Creatinine Ratio: 14 (ref 9–23)
BUN: 12 mg/dL (ref 6–24)
Bilirubin Total: 0.4 mg/dL (ref 0.0–1.2)
CO2: 26 mmol/L (ref 20–29)
Calcium: 9.9 mg/dL (ref 8.7–10.2)
Chloride: 101 mmol/L (ref 96–106)
Creatinine, Ser: 0.88 mg/dL (ref 0.57–1.00)
Globulin, Total: 2.1 g/dL (ref 1.5–4.5)
Glucose: 94 mg/dL (ref 70–99)
Potassium: 4.9 mmol/L (ref 3.5–5.2)
Sodium: 141 mmol/L (ref 134–144)
Total Protein: 6.8 g/dL (ref 6.0–8.5)
eGFR: 77 mL/min/1.73 (ref >59)

## 2024-05-21 LAB — CBC WITH DIFFERENTIAL/PLATELET
Basophils Absolute: 0.1 x10E3/uL (ref 0.0–0.2)
Basos: 1 %
EOS (ABSOLUTE): 0.2 x10E3/uL (ref 0.0–0.4)
Eos: 5 %
Hematocrit: 41.9 % (ref 34.0–46.6)
Hemoglobin: 13.7 g/dL (ref 11.1–15.9)
Immature Grans (Abs): 0 x10E3/uL (ref 0.0–0.1)
Immature Granulocytes: 0 %
Lymphocytes Absolute: 1.8 x10E3/uL (ref 0.7–3.1)
Lymphs: 38 %
MCH: 29 pg (ref 26.6–33.0)
MCHC: 32.7 g/dL (ref 31.5–35.7)
MCV: 89 fL (ref 79–97)
Monocytes Absolute: 0.5 x10E3/uL (ref 0.1–0.9)
Monocytes: 10 %
Neutrophils Absolute: 2.2 x10E3/uL (ref 1.4–7.0)
Neutrophils: 46 %
Platelets: 271 x10E3/uL (ref 150–450)
RBC: 4.72 x10E6/uL (ref 3.77–5.28)
RDW: 11.9 % (ref 11.7–15.4)
WBC: 4.7 x10E3/uL (ref 3.4–10.8)

## 2024-05-21 LAB — B12 AND FOLATE PANEL
Folate: 7.4 ng/mL (ref >3.0)
Vitamin B-12: 475 pg/mL (ref 232–1245)

## 2024-05-21 LAB — T4, FREE: Free T4: 1.41 ng/dL (ref 0.82–1.77)

## 2024-05-21 LAB — IRON AND TIBC
Iron Saturation: 25 % (ref 15–55)
Iron: 70 ug/dL (ref 27–159)
Total Iron Binding Capacity: 280 ug/dL (ref 250–450)
UIBC: 210 ug/dL (ref 131–425)

## 2024-05-21 LAB — LIPID PANEL WITH LDL/HDL RATIO
Cholesterol, Total: 245 mg/dL — ABNORMAL HIGH (ref 100–199)
HDL: 76 mg/dL (ref >39)
LDL Chol Calc (NIH): 161 mg/dL — ABNORMAL HIGH (ref 0–99)
LDL/HDL Ratio: 2.1 ratio (ref 0.0–3.2)
Triglycerides: 48 mg/dL (ref 0–149)
VLDL Cholesterol Cal: 8 mg/dL (ref 5–40)

## 2024-05-21 LAB — FERRITIN: Ferritin: 134 ng/mL (ref 15–150)

## 2024-05-21 LAB — TSH: TSH: 1.04 u[IU]/mL (ref 0.450–4.500)

## 2024-05-21 LAB — VITAMIN D 25 HYDROXY (VIT D DEFICIENCY, FRACTURES): Vit D, 25-Hydroxy: 56.5 ng/mL (ref 30.0–100.0)

## 2024-05-30 ENCOUNTER — Ambulatory Visit: Payer: Self-pay | Admitting: Physician Assistant

## 2024-06-08 NOTE — Telephone Encounter (Signed)
-----   Message from Tinnie MARLA Pro sent at 06/08/2024  1:45 PM EDT ----- Please let her know that her cholesterol is worsening, otherwise labs look good. Recommend trying crestor 5mg  2x per week in addition to working on diet and exercise as able. Avoid fried foods. ----- Message ----- From: Rebecka Memos Lab Results In Sent: 05/23/2024   6:15 AM EDT To: Tinnie MARLA Pro, PA-C

## 2024-06-08 NOTE — Telephone Encounter (Signed)
 Lvm and sent MyChart message for patient regarding lab results.

## 2024-06-11 ENCOUNTER — Other Ambulatory Visit: Payer: Self-pay | Admitting: Physician Assistant

## 2024-06-28 ENCOUNTER — Telehealth: Admitting: Diagnostic Neuroimaging

## 2024-07-28 ENCOUNTER — Ambulatory Visit: Payer: BC Managed Care – PPO | Admitting: Oncology

## 2024-07-28 ENCOUNTER — Other Ambulatory Visit: Payer: Self-pay

## 2024-07-28 ENCOUNTER — Other Ambulatory Visit: Payer: BC Managed Care – PPO

## 2024-07-28 ENCOUNTER — Ambulatory Visit: Payer: Self-pay | Admitting: Oncology

## 2024-08-04 ENCOUNTER — Ambulatory Visit: Admitting: Obstetrics and Gynecology

## 2024-08-04 ENCOUNTER — Encounter: Payer: Self-pay | Admitting: Obstetrics and Gynecology

## 2024-08-04 VITALS — BP 115/77 | HR 65 | Ht 63.78 in | Wt 130.6 lb

## 2024-08-04 DIAGNOSIS — N941 Unspecified dyspareunia: Secondary | ICD-10-CM

## 2024-08-04 DIAGNOSIS — M6289 Other specified disorders of muscle: Secondary | ICD-10-CM

## 2024-08-04 DIAGNOSIS — N390 Urinary tract infection, site not specified: Secondary | ICD-10-CM

## 2024-08-04 DIAGNOSIS — Z87440 Personal history of urinary (tract) infections: Secondary | ICD-10-CM

## 2024-08-04 DIAGNOSIS — N3281 Overactive bladder: Secondary | ICD-10-CM | POA: Diagnosis not present

## 2024-08-04 DIAGNOSIS — N952 Postmenopausal atrophic vaginitis: Secondary | ICD-10-CM

## 2024-08-04 DIAGNOSIS — K5904 Chronic idiopathic constipation: Secondary | ICD-10-CM

## 2024-08-04 LAB — POCT URINALYSIS DIP (CLINITEK)
Bilirubin, UA: NEGATIVE
Blood, UA: NEGATIVE
Glucose, UA: NEGATIVE mg/dL
Ketones, POC UA: NEGATIVE mg/dL
Nitrite, UA: NEGATIVE
POC PROTEIN,UA: NEGATIVE
Spec Grav, UA: 1.02 (ref 1.010–1.025)
Urobilinogen, UA: 0.2 U/dL
pH, UA: 6.5 (ref 5.0–8.0)

## 2024-08-04 MED ORDER — ESTRADIOL 0.01 % VA CREA: 0.5000 g | TOPICAL_CREAM | VAGINAL | 11 refills | Status: AC
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
## 2024-08-04 NOTE — Patient Instructions (Addendum)
 Please start estrogen cream. Use this at least twice a week. On nights you are not using the estrogen cream you can use aloe vera, coconut oil, or vitamin E oil.   Constipation: Our goal is to achieve formed bowel movements daily or every-other-day.  You may need to try different combinations of the following options to find what works best for you - everybody's body works differently so feel free to adjust the dosages as needed.  Some options to help maintain bowel health include:  You can also do 2 kiwis a day, this is the equivalent of Metamucil. Power Pudding is a natural mixture that may help your constipation.  To make blend 1 cup applesauce, 1 cup wheat bran, and 3/4 cup prune juice, refrigerate and then take 1 tablespoon daily with a large glass of water as needed.  For intercourse, please use a silicon blend or base lubrication.   For the spasms consider doing pumpkin seed extract up to 5gm per day.   Referral placed for pelvic floor PT.

## 2024-08-08 ENCOUNTER — Other Ambulatory Visit: Payer: Self-pay | Admitting: Obstetrics and Gynecology

## 2024-08-08 DIAGNOSIS — A498 Other bacterial infections of unspecified site: Secondary | ICD-10-CM

## 2024-08-08 DIAGNOSIS — N3000 Acute cystitis without hematuria: Secondary | ICD-10-CM

## 2024-08-08 MED ORDER — SULFAMETHOXAZOLE-TRIMETHOPRIM 800-160 MG PO TABS: 1.0000 | ORAL_TABLET | Freq: Two times a day (BID) | ORAL | 0 refills | Status: AC

## 2024-08-08 MED ORDER — METRONIDAZOLE 500 MG PO TABS: 500.0000 mg | ORAL_TABLET | Freq: Two times a day (BID) | ORAL | 0 refills | Status: AC

## 2024-08-08 NOTE — Progress Notes (Signed)
 Attempted to call patient regarding pathnostics results. She has >100,000 colonies growing in urine from both citrobacter koseri and Gardnerella. Suggested treatment include flagyl and bactrim  based on results. We had discussed at her appointment doing this test to rule out underlying/uncommon infections in the urine not typically picked up on a normal culture. It is my hope that by treating these she will have symptom relief and the topical estrogen support will prevent further UTI's.

## 2024-08-09 ENCOUNTER — Encounter: Payer: Self-pay | Admitting: Obstetrics and Gynecology

## 2024-09-06 ENCOUNTER — Ambulatory Visit: Admitting: Dermatology

## 2024-09-10 ENCOUNTER — Other Ambulatory Visit: Payer: Self-pay | Admitting: Physician Assistant

## 2024-09-10 ENCOUNTER — Other Ambulatory Visit: Payer: Self-pay | Admitting: Obstetrics and Gynecology

## 2024-09-10 DIAGNOSIS — N3000 Acute cystitis without hematuria: Secondary | ICD-10-CM

## 2024-09-10 DIAGNOSIS — A498 Other bacterial infections of unspecified site: Secondary | ICD-10-CM

## 2024-09-26 ENCOUNTER — Other Ambulatory Visit: Payer: Self-pay | Admitting: Obstetrics and Gynecology

## 2024-09-26 DIAGNOSIS — N390 Urinary tract infection, site not specified: Secondary | ICD-10-CM

## 2024-10-04 ENCOUNTER — Telehealth: Payer: Self-pay | Admitting: Physician Assistant

## 2024-10-04 ENCOUNTER — Ambulatory Visit: Admitting: Obstetrics and Gynecology

## 2024-10-04 NOTE — Telephone Encounter (Signed)
 Received request for 03/07/23-10/03/24 MR from [employer] Faxed them $29.75 invoice for 47 pages-[name]

## 2024-10-05 ENCOUNTER — Telehealth: Payer: Self-pay | Admitting: Physician Assistant

## 2024-10-05 NOTE — Telephone Encounter (Signed)
$  29.75 payment received per Melecia. MR faxed to [facility]; [phone]

## 2024-10-10 ENCOUNTER — Ambulatory Visit (INDEPENDENT_AMBULATORY_CARE_PROVIDER_SITE_OTHER): Admitting: Physician Assistant

## 2024-10-10 ENCOUNTER — Encounter: Payer: Self-pay | Admitting: Physician Assistant

## 2024-10-10 VITALS — BP 110/60 | HR 64 | Temp 98.0°F | Resp 16 | Ht 64.0 in | Wt 129.0 lb

## 2024-10-10 DIAGNOSIS — G43909 Migraine, unspecified, not intractable, without status migrainosus: Secondary | ICD-10-CM

## 2024-10-10 DIAGNOSIS — Q8500 Neurofibromatosis, unspecified: Secondary | ICD-10-CM

## 2024-10-10 DIAGNOSIS — N390 Urinary tract infection, site not specified: Secondary | ICD-10-CM

## 2024-10-10 DIAGNOSIS — D508 Other iron deficiency anemias: Secondary | ICD-10-CM

## 2024-10-10 MED ORDER — ALBUTEROL SULFATE HFA 108 (90 BASE) MCG/ACT IN AERS: 1.0000 | INHALATION_SPRAY | Freq: Four times a day (QID) | RESPIRATORY_TRACT | 1 refills | Status: AC | PRN

## 2024-10-10 MED ORDER — SUMATRIPTAN SUCCINATE 100 MG PO TABS: ORAL_TABLET | ORAL | 3 refills | Status: AC

## 2024-10-10 NOTE — Progress Notes (Signed)
 [facility] [address] [place], [place] [address]  Internal MEDICINE  Office Visit Note  Patient Name: Andrea Bernard  [id number]  [id number]  Date of Service: 10/10/2024  Chief Complaint  Patient presents with   Follow-up   Gastroesophageal Reflux   Medication Refill    Albuterol  and Imitrex     HPI Pt is here for routine follow up -Headache this morning, has long history of these with migraines and neurofibromatosis. Will see neurology again soon -Needs refills on imitrex  -Seeing Urogyn now, taking estradiol  cream 2x/week, also switched lube and this has helped -she is very pleased with the care and is following up next month, will have pap with her as well -has labs and hematology follow up with Dr. Babara this week  Current Medication: Outpatient Encounter Medications as of 10/10/2024  Medication Sig   CRANBERRY FRUIT PO Take by mouth.   D-MANNOSE PO Take by mouth.   estradiol  (ESTRACE ) 0.01 % CREA vaginal cream Place 0.5 g vaginally 2 (two) times a week. Place 0.5g nightly for two weeks then twice a week after   gabapentin (NEURONTIN) 300 MG capsule Take 300 mg by mouth 3 (three) times daily.   ibuprofen (ADVIL) 200 MG tablet Take 200 mg by mouth as needed.   metroNIDAZOLE  (FLAGYL ) 500 MG tablet Take 1 tablet (500 mg total) by mouth 2 (two) times daily.   OXYCONTIN  80 MG 12 hr tablet Take 80 mg by mouth every 12 (twelve) hours.   polyethylene glycol powder (GLYCOLAX/MIRALAX) 17 GM/SCOOP powder Take 17 g by mouth daily. Dissolve 1 capful (17g) in 4-8 ounces of liquid and take by mouth daily.   topiramate  (TOPAMAX ) 50 MG tablet Take 1 tablet (50 mg total) by mouth 2 (two) times daily.   [DISCONTINUED] albuterol  (VENTOLIN  HFA) 108 (90 Base) MCG/ACT inhaler INHALE 2 PUFFS BY MOUTH EVERY 6 HOURS AS NEEDED FOR WHEEZE OR SHORTNESS OF BREATH   [DISCONTINUED] Bearberry, Uva-Ursi, (UVA URSI PO) Take by mouth.   [DISCONTINUED] SUMAtriptan  (IMITREX ) 100 MG tablet MAY REPEAT  IN 2 HOURS IF HEADACHE PERSISTS OR RECURS.   albuterol  (VENTOLIN  HFA) 108 (90 Base) MCG/ACT inhaler Inhale 1-2 puffs into the lungs every 6 (six) hours as needed for wheezing or shortness of breath.   SUMAtriptan  (IMITREX ) 100 MG tablet May repeat in 2 hours if headache persists or recurs.   Facility-Administered Encounter Medications as of 10/10/2024  Medication   iron  sucrose (VENOFER ) 200 mg IVPB   sodium chloride  flush (NS) 0.9 % injection 10 mL    Surgical History: Past Surgical History:  Procedure Laterality Date   AUGMENTATION MAMMAPLASTY Bilateral 12/10/2020   BREAST ENHANCEMENT SURGERY Bilateral 12/04/2020   carpel tunnel release Bilateral    CESAREAN SECTION     x 4   COLONOSCOPY WITH PROPOFOL  N/A 09/04/2017   Procedure: COLONOSCOPY WITH PROPOFOL ;  Surgeon: Therisa Bi, MD;  Location: [facility] ENDOSCOPY;  Service: Gastroenterology;  Laterality: N/A;   COLONOSCOPY WITH PROPOFOL  N/A 12/21/2017   Procedure: COLONOSCOPY WITH PROPOFOL ;  Surgeon: Therisa Bi, MD;  Location: [facility] ENDOSCOPY;  Service: Gastroenterology;  Laterality: N/A;   ESOPHAGOGASTRODUODENOSCOPY (EGD) WITH PROPOFOL  N/A 09/04/2017   Procedure: ESOPHAGOGASTRODUODENOSCOPY (EGD) WITH PROPOFOL ;  Surgeon: Therisa Bi, MD;  Location: [facility] ENDOSCOPY;  Service: Gastroenterology;  Laterality: N/A;   HERNIA REPAIR     HERNIA REPAIR  12/04/2020   TUBAL LIGATION     tummy tuck  12/04/2020   removed 20 masses on stomach   TUMOR REMOVAL  x8    Medical History: Past Medical History:  Diagnosis Date   Arthritis    Dysplastic nevus 03/02/2008   Right lateral thigh. Moderate atypia, limited margins free.   Dysplastic nevus 01/27/2023   Right posterior forearm. Moderate atypia, close to margin.   GERD (gastroesophageal reflux disease)    Headache    Iron  deficiency anemia 06/15/2017   Neurofibromatosis (HCC)     Family History: Family History  Problem Relation Age of Onset   Anemia Mother    Lupus Mother     Sjogren's syndrome Mother    Rheum arthritis Mother    Schizophrenia Father    Neurofibromatosis Brother    Neurofibromatosis Daughter    Neurofibromatosis Son    Neurofibromatosis Son    Bladder Cancer Neg Hx    Kidney cancer Neg Hx    Breast cancer Neg Hx    Ovarian cancer Neg Hx    Colon cancer Neg Hx    Uterine cancer Neg Hx     Social History   Socioeconomic History   Marital status: Married    Spouse name: Not on file   Number of children: Not on file   Years of education: Not on file   Highest education level: Not on file  Occupational History   Not on file  Tobacco Use   Smoking status: Former    Types: Cigarettes   Smokeless tobacco: Never   Tobacco comments:    Age [age]  Vaping Use   Vaping status: Never Used  Substance and Sexual Activity   Alcohol  use: No   Drug use: Not Currently   Sexual activity: Yes    Partners: Male    Birth control/protection: Surgical  Other Topics Concern   Not on file  Social History Narrative   Not on file   Social Drivers of Health   Tobacco Use: Medium Risk (10/10/2024)   Patient History    Smoking Tobacco Use: Former    Smokeless Tobacco Use: Never    Passive Exposure: Not on Actuary Strain: Not on file  Food Insecurity: Not on file  Transportation Needs: Not on file  Physical Activity: Not on file  Stress: Not on file  Social Connections: Not on file  Intimate Partner Violence: Not on file  Depression (PHQ2-9): Low Risk (10/10/2024)   Depression (PHQ2-9)    PHQ-2 Score: 0  Alcohol  Screen: Low Risk (04/17/2022)   Alcohol  Screen    Last Alcohol  Screening Score (AUDIT): 0  Housing: Not on file  Utilities: Not on file  Health Literacy: Not on file      Review of Systems  Constitutional:  Negative for chills, fatigue and unexpected weight change.  HENT:  Negative for congestion, rhinorrhea, sneezing and sore throat.   Eyes:  Negative for redness.  Respiratory:  Negative for cough, chest  tightness and shortness of breath.   Cardiovascular:  Negative for chest pain and palpitations.  Gastrointestinal:  Negative for abdominal pain, constipation, diarrhea, nausea and vomiting.  Genitourinary:  Negative for dysuria and frequency.  Musculoskeletal:  Positive for arthralgias. Negative for back pain, joint swelling and neck pain.  Skin:  Negative for rash.  Neurological:  Positive for headaches. Negative for tremors and numbness.  Hematological:  Negative for adenopathy. Does not bruise/bleed easily.  Psychiatric/Behavioral:  Negative for behavioral problems (Depression), sleep disturbance and suicidal ideas. The patient is not nervous/anxious.     Vital Signs: BP 110/60   Pulse 64   Temp 98  F (36.7 C)   Resp 16   Ht 5' 4 (1.626 m)   Wt 129 lb (58.5 kg)   LMP 10/06/2018   SpO2 98%   BMI 22.14 kg/m    Physical Exam Vitals and nursing note reviewed.  Constitutional:      General: She is not in acute distress.    Appearance: Normal appearance. She is well-developed. She is not diaphoretic.  HENT:     Head: Normocephalic and atraumatic.  Eyes:     Extraocular Movements: Extraocular movements intact.  Neck:     Thyroid : No thyromegaly.     Vascular: No JVD.     Trachea: No tracheal deviation.  Cardiovascular:     Rate and Rhythm: Normal rate and regular rhythm.     Heart sounds: Normal heart sounds. No murmur heard.    No friction rub. No gallop.  Pulmonary:     Effort: Pulmonary effort is normal. No respiratory distress.     Breath sounds: No wheezing or rales.  Chest:     Chest wall: No tenderness.  Breasts:    Right: Normal. No mass.     Left: No mass.  Skin:    General: Skin is warm and dry.  Neurological:     Mental Status: She is alert and oriented to person, place, and time.  Psychiatric:        Behavior: Behavior normal.        Thought Content: Thought content normal.        Judgment: Judgment normal.        Assessment/Plan: 1. Migraine  syndrome (Primary) May use imitrex  as needed, continue topamax  as before, followed by neurology as well - SUMAtriptan  (IMITREX ) 100 MG tablet; May repeat in 2 hours if headache persists or recurs.  Dispense: 9 tablet; Refill: 3  2. Neurofibromatosis (HCC) Followed by neurology  3. Recurrent UTI Improved, followed by Urogyn now and is going very well   General Counseling: Lilyan verbalizes understanding of the findings of todays visit and agrees with plan of treatment. I have discussed any further diagnostic evaluation that may be needed or ordered today. We also reviewed her medications today. she has been encouraged to call the office with any questions or concerns that should arise related to todays visit.    No orders of the defined types were placed in this encounter.   Meds ordered this encounter  Medications   SUMAtriptan  (IMITREX ) 100 MG tablet    Sig: May repeat in 2 hours if headache persists or recurs.    Dispense:  9 tablet    Refill:  3   albuterol  (VENTOLIN  HFA) 108 (90 Base) MCG/ACT inhaler    Sig: Inhale 1-2 puffs into the lungs every 6 (six) hours as needed for wheezing or shortness of breath.    Dispense:  6.7 each    Refill:  1    This patient was seen by Tinnie Pro, PA-C in collaboration with Dr. Sigrid Bathe as a part of collaborative care agreement.   Total time spent:30 Minutes Time spent includes review of chart, medications, test results, and follow up plan with the patient.      Dr Fozia M Khan Internal medicine

## 2024-10-11 ENCOUNTER — Encounter: Payer: Self-pay | Admitting: Oncology

## 2024-10-11 ENCOUNTER — Inpatient Hospital Stay: Payer: Self-pay | Attending: Oncology

## 2024-10-11 ENCOUNTER — Ambulatory Visit: Payer: Self-pay | Admitting: Oncology

## 2024-10-11 ENCOUNTER — Inpatient Hospital Stay: Payer: Self-pay | Admitting: Oncology

## 2024-10-11 VITALS — BP 98/69 | HR 65 | Temp 96.5°F | Resp 18 | Wt 130.6 lb

## 2024-10-11 DIAGNOSIS — Q8501 Neurofibromatosis, type 1: Secondary | ICD-10-CM | POA: Diagnosis not present

## 2024-10-11 DIAGNOSIS — D508 Other iron deficiency anemias: Secondary | ICD-10-CM

## 2024-10-11 DIAGNOSIS — D509 Iron deficiency anemia, unspecified: Secondary | ICD-10-CM | POA: Diagnosis present

## 2024-10-11 DIAGNOSIS — Z87891 Personal history of nicotine dependence: Secondary | ICD-10-CM | POA: Insufficient documentation

## 2024-10-11 LAB — CBC WITH DIFFERENTIAL (CANCER CENTER ONLY)
Abs Immature Granulocytes: 0 K/uL (ref 0.00–0.07)
Basophils Absolute: 0.1 K/uL (ref 0.0–0.1)
Basophils Relative: 1 %
Eosinophils Absolute: 0.3 K/uL (ref 0.0–0.5)
Eosinophils Relative: 4 %
HCT: 43.6 % (ref 36.0–46.0)
Hemoglobin: 14.5 g/dL (ref 12.0–15.0)
Immature Granulocytes: 0 %
Lymphocytes Relative: 40 %
Lymphs Abs: 2.3 K/uL (ref 0.7–4.0)
MCH: 28 pg (ref 26.0–34.0)
MCHC: 33.3 g/dL (ref 30.0–36.0)
MCV: 84.2 fL (ref 80.0–100.0)
Monocytes Absolute: 0.4 K/uL (ref 0.1–1.0)
Monocytes Relative: 8 %
Neutro Abs: 2.6 K/uL (ref 1.7–7.7)
Neutrophils Relative %: 47 %
Platelet Count: 217 K/uL (ref 150–400)
RBC: 5.18 MIL/uL — ABNORMAL HIGH (ref 3.87–5.11)
RDW: 11.7 % (ref 11.5–15.5)
WBC Count: 5.7 K/uL (ref 4.0–10.5)
nRBC: 0 % (ref 0.0–0.2)

## 2024-10-11 LAB — RETIC PANEL
Immature Retic Fract: 3.1 % (ref 2.3–15.9)
RBC.: 5.06 MIL/uL (ref 3.87–5.11)
Retic Count, Absolute: 40 K/uL (ref 19.0–186.0)
Retic Ct Pct: 0.8 % (ref 0.4–3.1)
Reticulocyte Hemoglobin: 31.9 pg

## 2024-10-11 LAB — IRON AND TIBC
Iron: 80 ug/dL (ref 28–170)
Saturation Ratios: 28 % (ref 10.4–31.8)
TIBC: 286 ug/dL (ref 250–450)
UIBC: 206 ug/dL

## 2024-10-11 LAB — FERRITIN: Ferritin: 85 ng/mL (ref 11–307)

## 2024-10-11 NOTE — Assessment & Plan Note (Addendum)
 Continue follow-up with neurology. 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 Recommend annual mammogram- her pcp manages.  Last mammogram results reviewed. Annual MRI for breast cancer screening was previously denied by her insurance.

## 2024-10-11 NOTE — Progress Notes (Signed)
 " Hematology/Oncology Progress note [phone] [phone]     CHIEF COMPLAINTS/REASON FOR VISIT:  Anemia, history of NF1  ASSESSMENT & PLAN:   Iron  deficiency anemia Labs reviewed and discussed with patient Iron  level and hemoglobin have both improved.  Lab Results  Component Value Date   HGB 14.5 10/11/2024   TIBC 280 05/20/2024   IRONPCTSAT 25 05/20/2024   FERRITIN 134 05/20/2024    No need for IV Venofer .    Type 1 neurofibromatosis (HCC) Continue follow-up with neurology. 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 Recommend annual mammogram- her pcp manages.  Last mammogram results reviewed. Annual MRI for breast cancer screening was previously denied by her insurance.   Follow-up as needed  No orders of the defined types were placed in this encounter.   All questions were answered. The patient knows to call the clinic with any problems, questions or concerns.  [name], MD, PhD [facility] Hematology Oncology 10/11/2024     HISTORY OF PRESENTING ILLNESS:  Andrea Bernard is a  59 y.o.  female with PMH listed below who was referred to me for anemia Reviewed patient's recent labs that was done.  She was found to have abnormal CBC on 02/18/2022, hemoglobin 11.2.  MCV 81.  Iron  panel showed iron  saturation 11, ferritin 7.  Patient was previously seen by me 4 years ago for iron  deficiency anemia and previously tolerated IV Venofer  treatments. She is postmenopausal, LMP January 2020.  She is not able to tolerate oral iron  supplementation. + Fatigue, shortness of breath with exertion. She denies recent chest pain on exertion, pre-syncopal episodes, or palpitations She had not noticed any recent bleeding such as epistaxis, hematuria or hematochezia.  Patient uses over-the-counter NSAIDs as needed for arthritis. Her last colonoscopy was 2019. She denies any pica and eats a variety of diet.   INTERVAL HISTORY Andrea Bernard is a 59 y.o. female who has above history reviewed by me today presents for follow up visit for iron  deficiency anemia, history of NF1 No new complaints. MEDICAL HISTORY:  Past Medical History:  Diagnosis Date   Arthritis    Dysplastic nevus 03/02/2008   Right lateral thigh. Moderate atypia, limited margins free.   Dysplastic nevus 01/27/2023   Right posterior forearm. Moderate atypia, close to margin.   GERD (gastroesophageal reflux disease)    Headache    Iron  deficiency anemia 06/15/2017   Neurofibromatosis [facility])     SURGICAL HISTORY: Past Surgical History:  Procedure Laterality Date   AUGMENTATION MAMMAPLASTY Bilateral 12/10/2020   BREAST ENHANCEMENT SURGERY Bilateral 12/04/2020   carpel tunnel release Bilateral    CESAREAN SECTION     x 4   COLONOSCOPY WITH PROPOFOL  N/A 09/04/2017   Procedure: COLONOSCOPY WITH PROPOFOL ;  Surgeon: Therisa Bi, MD;  Location: [facility] ENDOSCOPY;  Service: Gastroenterology;  Laterality: N/A;   COLONOSCOPY WITH PROPOFOL  N/A 12/21/2017   Procedure: COLONOSCOPY WITH PROPOFOL ;  Surgeon: Therisa Bi, MD;  Location[facility] ENDOSCOPY;  Service: Gastroenterology;  Laterality: N/A;   ESOPHAGOGASTRODUODENOSCOPY (EGD) WITH PROPOFOL  N/A 09/04/2017   Procedure: ESOPHAGOGASTRODUODENOSCOPY (EGD) WITH PROPOFOL ;  Surgeon: Therisa Bi, MD;  Location: [facility] ENDOSCOPY;  Service: Gastroenterology;  Laterality: N/A;   HERNIA REPAIR     HERNIA REPAIR  12/04/2020   TUBAL LIGATION     tummy tuck  12/04/2020   removed 20 masses on stomach   TUMOR REMOVAL     x8    SOCIAL HISTORY: Social History  Socioeconomic History   Marital status: Married    Spouse name: Not on file   Number of children: Not on file   Years of education: Not on file   Highest education level: Not on file  Occupational History   Not on file  Tobacco Use   Smoking status: Former    Types: Cigarettes   Smokeless tobacco: Never   Tobacco comments:    Age [age]  Vaping Use   Vaping  status: Never Used  Substance and Sexual Activity   Alcohol  use: No   Drug use: Not Currently   Sexual activity: Yes    Partners: Male    Birth control/protection: Surgical  Other Topics Concern   Not on file  Social History Narrative   Not on file   Social Drivers of Health   Tobacco Use: Medium Risk (10/11/2024)   Patient History    Smoking Tobacco Use: Former    Smokeless Tobacco Use: Never    Passive Exposure: Not on Actuary Strain: Not on file  Food Insecurity: Not on file  Transportation Needs: Not on file  Physical Activity: Not on file  Stress: Not on file  Social Connections: Not on file  Intimate Partner Violence: Not on file  Depression (PHQ2-9): Low Risk (10/10/2024)   Depression (PHQ2-9)    PHQ-2 Score: 0  Alcohol  Screen: Low Risk (04/17/2022)   Alcohol  Screen    Last Alcohol  Screening Score (AUDIT): 0  Housing: Not on file  Utilities: Not on file  Health Literacy: Not on file    FAMILY HISTORY: Family History  Problem Relation Age of Onset   Anemia Mother    Lupus Mother    Sjogren's syndrome Mother    Rheum arthritis Mother    Schizophrenia Father    Neurofibromatosis Brother    Neurofibromatosis Daughter    Neurofibromatosis Son    Neurofibromatosis Son    Bladder Cancer Neg Hx    Kidney cancer Neg Hx    Breast cancer Neg Hx    Ovarian cancer Neg Hx    Colon cancer Neg Hx    Uterine cancer Neg Hx     ALLERGIES:  is allergic to ciprofloxacin  and nitrofuran derivatives.  MEDICATIONS:  Current Outpatient Medications  Medication Sig Dispense Refill   albuterol  (VENTOLIN  HFA) 108 (90 Base) MCG/ACT inhaler Inhale 1-2 puffs into the lungs every 6 (six) hours as needed for wheezing or shortness of breath. 6.7 each 1   CRANBERRY FRUIT PO Take by mouth.     D-MANNOSE PO Take by mouth.     estradiol  (ESTRACE ) 0.01 % CREA vaginal cream Place 0.5 g vaginally 2 (two) times a week. Place 0.5g nightly for two weeks then twice a week after  42 g 11   gabapentin (NEURONTIN) 300 MG capsule Take 300 mg by mouth 3 (three) times daily.  0   ibuprofen (ADVIL) 200 MG tablet Take 200 mg by mouth as needed.     metroNIDAZOLE  (FLAGYL ) 500 MG tablet Take 1 tablet (500 mg total) by mouth 2 (two) times daily. 14 tablet 0   OXYCONTIN  80 MG 12 hr tablet Take 80 mg by mouth every 12 (twelve) hours.  0   polyethylene glycol powder (GLYCOLAX/MIRALAX) 17 GM/SCOOP powder Take 17 g by mouth daily. Dissolve 1 capful (17g) in 4-8 ounces of liquid and take by mouth daily.     SUMAtriptan  (IMITREX ) 100 MG tablet May repeat in 2 hours if headache persists or recurs. 9 tablet  3   topiramate  (TOPAMAX ) 50 MG tablet Take 1 tablet (50 mg total) by mouth 2 (two) times daily. 60 tablet 12   No current facility-administered medications for this visit.   Facility-Administered Medications Ordered in Other Visits  Medication Dose Route Frequency Provider Last Rate Last Admin   iron  sucrose (VENOFER ) 200 mg IVPB  200 mg Intravenous Weekly Babara Call, MD       sodium chloride  flush (NS) 0.9 % injection 10 mL  10 mL Intracatheter PRN Babara Call, MD        Review of Systems  Constitutional:  Negative for appetite change, chills, fatigue and fever.  HENT:   Negative for hearing loss and voice change.   Eyes:  Negative for eye problems.  Respiratory:  Negative for chest tightness and cough.   Cardiovascular:  Negative for chest pain.  Gastrointestinal:  Negative for abdominal distention, abdominal pain and blood in stool.  Endocrine: Negative for hot flashes.  Genitourinary:  Negative for difficulty urinating and frequency.   Musculoskeletal:  Positive for arthralgias.  Skin:  Negative for itching and rash.  Neurological:  Negative for extremity weakness.  Hematological:  Negative for adenopathy.  Psychiatric/Behavioral:  Negative for confusion.     PHYSICAL EXAMINATION: ECOG PERFORMANCE STATUS: 1 - Symptomatic but completely ambulatory Vitals:   10/11/24 1333   BP: 98/69  Pulse: 65  Resp: 18  Temp: (!) 96.5 F (35.8 C)  SpO2: 100%   Filed Weights   10/11/24 1333  Weight: 130 lb 9.6 oz (59.2 kg)    Physical Exam Constitutional:      General: She is not in acute distress. HENT:     Head: Normocephalic and atraumatic.  Eyes:     General: No scleral icterus. Cardiovascular:     Rate and Rhythm: Normal rate and regular rhythm.     Heart sounds: Normal heart sounds.  Pulmonary:     Effort: Pulmonary effort is normal. No respiratory distress.     Breath sounds: No wheezing.  Abdominal:     General: Bowel sounds are normal. There is no distension.     Palpations: Abdomen is soft.  Musculoskeletal:        General: No deformity. Normal range of motion.     Cervical back: Normal range of motion and neck supple.  Skin:    General: Skin is warm and dry.     Findings: No erythema or rash.  Neurological:     Mental Status: She is alert and oriented to person, place, and time. Mental status is at baseline.  Psychiatric:        Mood and Affect: Mood normal.      LABORATORY DATA:  I have reviewed the data as listed     Latest Ref Rng & Units 10/11/2024    1:25 PM 05/20/2024    9:55 AM 07/29/2023    1:53 PM  CBC  WBC 4.0 - 10.5 K/uL 5.7  4.7  5.9   Hemoglobin 12.0 - 15.0 g/dL 85.4  86.2  86.4   Hematocrit 36.0 - 46.0 % 43.6  41.9  41.6   Platelets 150 - 400 K/uL 217  271  215       Latest Ref Rng & Units 05/20/2024    9:55 AM 02/10/2023   10:16 AM 02/18/2022   11:43 AM  CMP  Glucose 70 - 99 mg/dL 94  82  96   BUN 6 - 24 mg/dL 12  15  12    Creatinine  0.57 - 1.00 mg/dL 9.11  9.16  9.14   Sodium 134 - 144 mmol/L 141  139  138   Potassium 3.5 - 5.2 mmol/L 4.9  4.8  4.8   Chloride 96 - 106 mmol/L 101  102  100   CO2 20 - 29 mmol/L 26  25  25    Calcium 8.7 - 10.2 mg/dL 9.9  9.2  9.4   Total Protein 6.0 - 8.5 g/dL 6.8  6.5  7.1   Total Bilirubin 0.0 - 1.2 mg/dL 0.4  0.3  0.4   Alkaline Phos 44 - 121 IU/L 59  48  98   AST 0 - 40  IU/L 16  20  22    ALT 0 - 32 IU/L 10  12  17     Lab Results  Component Value Date   IRON  80 10/11/2024   TIBC 286 10/11/2024   FERRITIN 85 10/11/2024       RADIOGRAPHIC STUDIES: I have personally reviewed the radiological images as listed and agreed with the findings in the report. No results found.   "

## 2024-10-11 NOTE — Assessment & Plan Note (Addendum)
 Labs reviewed and discussed with patient Iron  level and hemoglobin have both improved.  Lab Results  Component Value Date   HGB 14.5 10/11/2024   TIBC 280 05/20/2024   IRONPCTSAT 25 05/20/2024   FERRITIN 134 05/20/2024    No need for IV Venofer .

## 2024-11-07 ENCOUNTER — Ambulatory Visit: Admitting: Obstetrics and Gynecology

## 2024-11-30 ENCOUNTER — Ambulatory Visit: Admitting: Obstetrics and Gynecology

## 2025-04-10 ENCOUNTER — Encounter: Admitting: Physician Assistant
